# Patient Record
Sex: Female | Born: 1950
Health system: Southern US, Community
[De-identification: ages and names within clinical notes are randomized; demographics above are authoritative.]

## PROBLEM LIST (undated history)

## (undated) DIAGNOSIS — G4733 Obstructive sleep apnea (adult) (pediatric): Secondary | ICD-10-CM

## (undated) DIAGNOSIS — N611 Abscess of the breast and nipple: Secondary | ICD-10-CM

## (undated) DIAGNOSIS — F329 Major depressive disorder, single episode, unspecified: Secondary | ICD-10-CM

## (undated) DIAGNOSIS — M719 Bursopathy, unspecified: Secondary | ICD-10-CM

## (undated) DIAGNOSIS — E559 Vitamin D deficiency, unspecified: Secondary | ICD-10-CM

## (undated) DIAGNOSIS — F439 Reaction to severe stress, unspecified: Secondary | ICD-10-CM

## (undated) DIAGNOSIS — E785 Hyperlipidemia, unspecified: Secondary | ICD-10-CM

## (undated) DIAGNOSIS — J302 Other seasonal allergic rhinitis: Secondary | ICD-10-CM

## (undated) DIAGNOSIS — R0600 Dyspnea, unspecified: Secondary | ICD-10-CM

## (undated) DIAGNOSIS — R600 Localized edema: Secondary | ICD-10-CM

## (undated) DIAGNOSIS — G43909 Migraine, unspecified, not intractable, without status migrainosus: Secondary | ICD-10-CM

## (undated) DIAGNOSIS — M549 Dorsalgia, unspecified: Secondary | ICD-10-CM

## (undated) DIAGNOSIS — C50212 Malignant neoplasm of upper-inner quadrant of left female breast: Secondary | ICD-10-CM

## (undated) DIAGNOSIS — R232 Flushing: Secondary | ICD-10-CM

## (undated) DIAGNOSIS — S21009A Unspecified open wound of unspecified breast, initial encounter: Secondary | ICD-10-CM

## (undated) DIAGNOSIS — C50919 Malignant neoplasm of unspecified site of unspecified female breast: Secondary | ICD-10-CM

## (undated) DIAGNOSIS — R21 Rash and other nonspecific skin eruption: Secondary | ICD-10-CM

## (undated) DIAGNOSIS — Z923 Personal history of irradiation: Secondary | ICD-10-CM

## (undated) DIAGNOSIS — I10 Essential (primary) hypertension: Secondary | ICD-10-CM

## (undated) DIAGNOSIS — G2581 Restless legs syndrome: Secondary | ICD-10-CM

## (undated) DIAGNOSIS — N6019 Diffuse cystic mastopathy of unspecified breast: Secondary | ICD-10-CM

## (undated) DIAGNOSIS — F32A Depression, unspecified: Secondary | ICD-10-CM

## (undated) HISTORY — PX: TONSILLECTOMY: SUR1361

## (undated) HISTORY — PX: COLONOSCOPY: SHX174

## (undated) HISTORY — DX: Vitamin D deficiency, unspecified: E55.9

## (undated) HISTORY — PX: BREAST LUMPECTOMY: SHX2

## (undated) HISTORY — DX: Dorsalgia, unspecified: M54.9

## (undated) HISTORY — DX: Restless legs syndrome: G25.81

## (undated) HISTORY — DX: Migraine, unspecified, not intractable, without status migrainosus: G43.909

## (undated) HISTORY — DX: Dyspnea, unspecified: R06.00

## (undated) HISTORY — DX: Localized edema: R60.0

## (undated) HISTORY — DX: Reaction to severe stress, unspecified: F43.9

## (undated) HISTORY — DX: Bursopathy, unspecified: M71.9

## (undated) HISTORY — DX: Other seasonal allergic rhinitis: J30.2

---
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
## 2003-02-24 HISTORY — PX: LUMBAR MICRODISCECTOMY: SHX99

## 2006-09-08 ENCOUNTER — Encounter: Admission: RE | Admit: 2006-09-08 | Discharge: 2006-09-08 | Payer: Self-pay | Admitting: Internal Medicine

## 2006-09-13 ENCOUNTER — Encounter: Admission: RE | Admit: 2006-09-13 | Discharge: 2006-09-13 | Payer: Self-pay | Admitting: Family Medicine

## 2007-10-10 ENCOUNTER — Other Ambulatory Visit: Admission: RE | Admit: 2007-10-10 | Discharge: 2007-10-10 | Payer: Self-pay | Admitting: Internal Medicine

## 2009-09-03 ENCOUNTER — Encounter: Admission: RE | Admit: 2009-09-03 | Discharge: 2009-09-03 | Payer: Self-pay | Admitting: Internal Medicine

## 2009-09-11 ENCOUNTER — Encounter: Admission: RE | Admit: 2009-09-11 | Discharge: 2009-09-11 | Payer: Self-pay | Admitting: Internal Medicine

## 2011-06-18 ENCOUNTER — Other Ambulatory Visit: Payer: Self-pay | Admitting: Internal Medicine

## 2011-06-18 ENCOUNTER — Other Ambulatory Visit (HOSPITAL_COMMUNITY)
Admission: RE | Admit: 2011-06-18 | Discharge: 2011-06-18 | Disposition: A | Discharge status: Home / Self Care | Payer: Federal, State, Local not specified - PPO | Source: Ambulatory Visit | Attending: Internal Medicine | Admitting: Internal Medicine

## 2011-06-18 ENCOUNTER — Other Ambulatory Visit: Payer: Self-pay | Admitting: *Deleted

## 2011-06-18 DIAGNOSIS — N6322 Unspecified lump in the left breast, upper inner quadrant: Secondary | ICD-10-CM

## 2011-06-18 DIAGNOSIS — Z1159 Encounter for screening for other viral diseases: Secondary | ICD-10-CM | POA: Insufficient documentation

## 2011-06-18 DIAGNOSIS — Z01419 Encounter for gynecological examination (general) (routine) without abnormal findings: Secondary | ICD-10-CM | POA: Insufficient documentation

## 2011-06-23 ENCOUNTER — Other Ambulatory Visit: Payer: Self-pay | Admitting: Internal Medicine

## 2011-06-23 ENCOUNTER — Ambulatory Visit
Admission: RE | Admit: 2011-06-23 | Discharge: 2011-06-23 | Disposition: A | Discharge status: Home / Self Care | Payer: Federal, State, Local not specified - PPO | Source: Ambulatory Visit | Attending: Internal Medicine | Admitting: Internal Medicine

## 2011-06-23 DIAGNOSIS — N6322 Unspecified lump in the left breast, upper inner quadrant: Secondary | ICD-10-CM

## 2011-06-25 ENCOUNTER — Other Ambulatory Visit: Payer: Self-pay | Admitting: Internal Medicine

## 2011-06-25 ENCOUNTER — Ambulatory Visit
Admission: RE | Admit: 2011-06-25 | Discharge: 2011-06-25 | Disposition: A | Discharge status: Home / Self Care | Payer: Federal, State, Local not specified - PPO | Source: Ambulatory Visit | Attending: Internal Medicine | Admitting: Internal Medicine

## 2011-06-25 DIAGNOSIS — N6322 Unspecified lump in the left breast, upper inner quadrant: Secondary | ICD-10-CM

## 2011-06-25 DIAGNOSIS — N63 Unspecified lump in unspecified breast: Secondary | ICD-10-CM

## 2011-06-26 ENCOUNTER — Other Ambulatory Visit: Payer: Self-pay | Admitting: Internal Medicine

## 2011-06-26 ENCOUNTER — Ambulatory Visit
Admission: RE | Admit: 2011-06-26 | Discharge: 2011-06-26 | Disposition: A | Discharge status: Home / Self Care | Payer: Federal, State, Local not specified - PPO | Source: Ambulatory Visit | Attending: Internal Medicine | Admitting: Internal Medicine

## 2011-06-26 DIAGNOSIS — C50912 Malignant neoplasm of unspecified site of left female breast: Secondary | ICD-10-CM

## 2011-06-26 DIAGNOSIS — N63 Unspecified lump in unspecified breast: Secondary | ICD-10-CM

## 2011-06-29 ENCOUNTER — Other Ambulatory Visit: Payer: Self-pay | Admitting: *Deleted

## 2011-06-29 DIAGNOSIS — C50219 Malignant neoplasm of upper-inner quadrant of unspecified female breast: Secondary | ICD-10-CM

## 2011-06-29 DIAGNOSIS — C50212 Malignant neoplasm of upper-inner quadrant of left female breast: Secondary | ICD-10-CM | POA: Insufficient documentation

## 2011-06-29 HISTORY — DX: Malignant neoplasm of upper-inner quadrant of left female breast: C50.212

## 2011-06-30 ENCOUNTER — Ambulatory Visit
Admission: RE | Admit: 2011-06-30 | Discharge: 2011-06-30 | Disposition: A | Discharge status: Home / Self Care | Payer: Federal, State, Local not specified - PPO | Source: Ambulatory Visit | Attending: Internal Medicine | Admitting: Internal Medicine

## 2011-06-30 DIAGNOSIS — C50912 Malignant neoplasm of unspecified site of left female breast: Secondary | ICD-10-CM

## 2011-06-30 MED ORDER — GADOBENATE DIMEGLUMINE 529 MG/ML IV SOLN
19.0000 mL | Freq: Once | INTRAVENOUS | Status: AC | PRN
  Administered 2011-06-30: 19 mL via INTRAVENOUS

## 2011-07-01 ENCOUNTER — Encounter: Payer: Self-pay | Admitting: Radiation Oncology

## 2011-07-01 ENCOUNTER — Other Ambulatory Visit (HOSPITAL_BASED_OUTPATIENT_CLINIC_OR_DEPARTMENT_OTHER): Payer: Federal, State, Local not specified - PPO | Admitting: Lab

## 2011-07-01 ENCOUNTER — Telehealth: Payer: Self-pay | Admitting: *Deleted

## 2011-07-01 ENCOUNTER — Encounter: Payer: Self-pay | Admitting: *Deleted

## 2011-07-01 ENCOUNTER — Encounter (INDEPENDENT_AMBULATORY_CARE_PROVIDER_SITE_OTHER): Payer: Self-pay | Admitting: Surgery

## 2011-07-01 ENCOUNTER — Ambulatory Visit (HOSPITAL_BASED_OUTPATIENT_CLINIC_OR_DEPARTMENT_OTHER): Payer: Federal, State, Local not specified - PPO | Admitting: Oncology

## 2011-07-01 ENCOUNTER — Encounter: Payer: Self-pay | Admitting: Specialist

## 2011-07-01 ENCOUNTER — Ambulatory Visit: Payer: Federal, State, Local not specified - PPO

## 2011-07-01 ENCOUNTER — Encounter: Payer: Self-pay | Admitting: Oncology

## 2011-07-01 ENCOUNTER — Ambulatory Visit: Payer: Federal, State, Local not specified - PPO | Attending: Surgery | Admitting: Physical Therapy

## 2011-07-01 ENCOUNTER — Ambulatory Visit
Admission: RE | Admit: 2011-07-01 | Discharge: 2011-07-01 | Disposition: A | Discharge status: Home / Self Care | Payer: Federal, State, Local not specified - PPO | Source: Ambulatory Visit | Attending: Radiation Oncology | Admitting: Radiation Oncology

## 2011-07-01 ENCOUNTER — Ambulatory Visit (HOSPITAL_BASED_OUTPATIENT_CLINIC_OR_DEPARTMENT_OTHER): Payer: Federal, State, Local not specified - PPO | Admitting: Surgery

## 2011-07-01 VITALS — BP 119/76 | HR 82 | Temp 98.0°F | Ht 65.2 in | Wt 220.6 lb

## 2011-07-01 DIAGNOSIS — M25619 Stiffness of unspecified shoulder, not elsewhere classified: Secondary | ICD-10-CM | POA: Insufficient documentation

## 2011-07-01 DIAGNOSIS — C50219 Malignant neoplasm of upper-inner quadrant of unspecified female breast: Secondary | ICD-10-CM

## 2011-07-01 DIAGNOSIS — Z170 Estrogen receptor positive status [ER+]: Secondary | ICD-10-CM

## 2011-07-01 DIAGNOSIS — C50919 Malignant neoplasm of unspecified site of unspecified female breast: Secondary | ICD-10-CM | POA: Insufficient documentation

## 2011-07-01 DIAGNOSIS — IMO0001 Reserved for inherently not codable concepts without codable children: Secondary | ICD-10-CM | POA: Insufficient documentation

## 2011-07-01 DIAGNOSIS — R293 Abnormal posture: Secondary | ICD-10-CM | POA: Insufficient documentation

## 2011-07-01 LAB — COMPREHENSIVE METABOLIC PANEL
ALT: 23 U/L (ref 0–35)
AST: 23 U/L (ref 0–37)
Albumin: 4.1 g/dL (ref 3.5–5.2)
Alkaline Phosphatase: 90 U/L (ref 39–117)
BUN: 16 mg/dL (ref 6–23)
CO2: 24 mEq/L (ref 19–32)
Calcium: 9.4 mg/dL (ref 8.4–10.5)
Chloride: 104 mEq/L (ref 96–112)
Creatinine, Ser: 1.04 mg/dL (ref 0.50–1.10)
Glucose, Bld: 146 mg/dL — ABNORMAL HIGH (ref 70–99)
Potassium: 3.5 mEq/L (ref 3.5–5.3)
Sodium: 139 mEq/L (ref 135–145)
Total Bilirubin: 0.4 mg/dL (ref 0.3–1.2)
Total Protein: 7.6 g/dL (ref 6.0–8.3)

## 2011-07-01 LAB — CBC WITH DIFFERENTIAL/PLATELET
BASO%: 0.4 % (ref 0.0–2.0)
Basophils Absolute: 0 10*3/uL (ref 0.0–0.1)
EOS%: 2.4 % (ref 0.0–7.0)
Eosinophils Absolute: 0.2 10*3/uL (ref 0.0–0.5)
HCT: 38.9 % (ref 34.8–46.6)
HGB: 12.7 g/dL (ref 11.6–15.9)
LYMPH%: 23.1 % (ref 14.0–49.7)
MCH: 30.2 pg (ref 25.1–34.0)
MCHC: 32.6 g/dL (ref 31.5–36.0)
MCV: 92.6 fL (ref 79.5–101.0)
MONO#: 0.4 10*3/uL (ref 0.1–0.9)
MONO%: 6.2 % (ref 0.0–14.0)
NEUT#: 4.8 10*3/uL (ref 1.5–6.5)
NEUT%: 67.9 % (ref 38.4–76.8)
Platelets: 233 10*3/uL (ref 145–400)
RBC: 4.2 10*6/uL (ref 3.70–5.45)
RDW: 13.9 % (ref 11.2–14.5)
WBC: 7.1 10*3/uL (ref 3.9–10.3)
lymph#: 1.6 10*3/uL (ref 0.9–3.3)

## 2011-07-01 LAB — CANCER ANTIGEN 27.29: CA 27.29: 25 U/mL (ref 0–39)

## 2011-07-01 NOTE — Progress Notes (Signed)
Radiation Oncology         [phone]) [phone] ________________________________  Initial outpatient Consultation  Name: Katelyn Walsh MRN: [id number]  Date: 07/01/2011  DOB: 11-24-1950   REFERRING PHYSICIAN: Cornett, Clovis Pu., MD  DIAGNOSIS: left breast invasive ductal carcinoma, upper inner quadrant, Clinical T1c N 0 M0, ER positive/PR negative HER-2 negative Ki-67 6% grade 2   HISTORY OF PRESENT ILLNESS::Katelyn Walsh is a 61 y.o. female who was seen for a outpatient checkup her physician's assistant felt a lump in her left breast on exam. The patient reports that her last mammogram was in 2011 and was okay. She was referred for workup of this mass and underwent a mammogram that revealed a 1.6 x 1.3 cm lesion in the upper inner quadrant. She underwent a biopsy of the mass which revealed invasive ductal carcinoma, grade 2, ER positive PR negative HER-2 negative Ki-67 6%. An MRI has also been performed as of May 7 which confirmed a 1.6 cm mass in the left breast. There are no other concerning findings MRI.   Patient is in her usual state of health other than feeling more depressed than usual since her husband's death in 01/06/10. She reports decreased energy and more sleepy than usual. She also has chronic headaches but these are not worse than usual.   PREVIOUS RADIATION THERAPY: No  PAST MEDICAL HISTORY:  has a past medical history of Back pain; Migraines; and Seasonal allergies.    PAST SURGICAL HISTORY: Past Surgical History  Procedure Date  . Tonsillectomy    Gynecologic history: She has never conceived a child. Her first menstrual period was at an unknown time. She can't remember the approximate date of her last period. She denies any history of hormone replacement.  FAMILY HISTORY: family history includes Cancer in her father. he had a brain tumor.  SOCIAL HISTORY:  reports that she has never smoked. She does not have any smokeless tobacco history on file. She reports that she  drinks alcohol. She reports that she does not use illicit drugs.  ALLERGIES: Review of patient's allergies indicates no known allergies.  MEDICATIONS:  Current Outpatient Prescriptions  Medication Sig Dispense Refill  . aspirin 81 MG tablet Take 81 mg by mouth daily.      . cetirizine (ZYRTEC) 10 MG tablet Take 10 mg by mouth daily.      . DULoxetine (CYMBALTA) 60 MG capsule Take 60 mg by mouth daily.      [name] Kitchen losartan-hydrochlorothiazide (HYZAAR) 100-25 MG per tablet Take 1 tablet by mouth daily.      [name] Kitchen MISC NATURAL PRODUCTS ER PO Take 1 capsule by mouth daily. Align probiotic      . montelukast (SINGULAIR) 10 MG tablet Take 10 mg by mouth at bedtime.      . multivitamin-iron-minerals-folic acid (CENTRUM) chewable tablet Chew 1 tablet by mouth daily.      . simvastatin (ZOCOR) 40 MG tablet Take 40 mg by mouth at bedtime.      . topiramate (TOPAMAX) 100 MG tablet Take 100 mg by mouth 2 (two) times daily.        REVIEW OF SYSTEMS:  A complete 14 point review of systems was obtained and reviewed with the patient. It is notable for that above.    PHYSICAL EXAM: Blood pressure 119/76. Pulse 82; temperature 98 degrees. weight 220 pounds; height 5 foot 5 inches.  General: Alert and oriented, in no acute distress HEENT: Head is normocephalic. Pupils are equally round and reactive  to light. Extraocular movements are intact. Oropharynx is clear. Neck: Neck is supple, no palpable cervical or supraclavicular lymphadenopathy. Heart: Regular in rate and rhythm with no murmurs, rubs, or gallops. Chest: Clear to auscultation bilaterally, with no rhonchi, wheezes, or rales. Abdomen: Soft, nontender, nondistended, with no rigidity or guarding. Extremities: No cyanosis or edema. Lymphatics: No concerning lymphadenopathy. Skin: No concerning lesions. Musculoskeletal: symmetric strength and muscle tone throughout. Neurologic: Cranial nerves II through XII are grossly intact. No obvious focalities. Speech  is fluent. Coordination is intact. Psychiatric: Judgment and insight are intact. Affect is appropriate. Breasts: The left breast is notable for a post biopsy ecchymosis at the 12:00 position. There are no palpable lesions of concern in either breast nor any palpable axillary lymphadenopathy bilaterally    LABORATORY DATA:  Lab Results  Component Value Date   WBC 7.1 07/01/2011   HGB 12.7 07/01/2011   HCT 38.9 07/01/2011   MCV 92.6 07/01/2011   PLT 233 07/01/2011   CMP     Component Value Date/Time   NA 139 07/01/2011 1203   K 3.5 07/01/2011 1203   CL 104 07/01/2011 1203   CO2 24 07/01/2011 1203   GLUCOSE 146* 07/01/2011 1203   BUN 16 07/01/2011 1203   CREATININE 1.04 07/01/2011 1203   CALCIUM 9.4 07/01/2011 1203   PROT 7.6 07/01/2011 1203   ALBUMIN 4.1 07/01/2011 1203   AST 23 07/01/2011 1203   ALT 23 07/01/2011 1203   ALKPHOS 90 07/01/2011 1203   BILITOT 0.4 07/01/2011 1203      RADIOGRAPHY: US Breast Left  06/23/2011  *RADIOLOGY REPORT*  Clinical Data:  Palpable abnormality in the upper inner left breast at physical examination.  Annual reevaluation, right breast.  DIGITAL DIAGNOSTIC BILATERAL MAMMOGRAM WITH CAD AND LEFT BREAST ULTRASOUND:  Comparison:  09/11/2009, 09/03/2009, dating back to 09/08/2006.  Findings:  CC and MLO views of both breasts and a spot tangential view of the left breast at the site of palpable concern were obtained.  Scattered fibroglandular parenchymal pattern, unchanged. Approximate 2.3 x 1.7 x 1.3 cm spiculated dense mass in the inner left breast, middle third, corresponds to the palpable abnormality as marked on the tangential view.  No suspicious mass, architectural distortion, or suspicious calcifications elsewhere in either breast. Mammographic images were processed with CAD.  On physical exam, I palpate a firm, fixed mass in the approximate 10 o'clock position of the left breast, 7 cm from the nipple.  Ultrasound is performed, showing and irregular hypoechoic mass with acoustic  shadowing at the 10 o'clock position of the left breast, 7 cm from the nipple, measuring approximately 1.3 x 0.8 x 1.6 cm.  IMPRESSION: Mass in the 10 o'clock position of the left breast, 7 cm from the nipple, suspicious for breast cancer.  Recommendation:  Ultrasound guided core needle biopsy was discussed with the patient who agrees to proceed.  This has been scheduled for Thursday May 2 at 10:00 a.m.  BI-RADS CATEGORY 5:  Highly suggestive of malignancy - appropriate action should be taken.  Original Report Authenticated By: Arnell Sieving, M.D.   Mr Breast Bilateral W Wo Contrast  06/30/2011  *RADIOLOGY REPORT*  Clinical Data: Recently diagnosed left breast invasive ductal carcinoma.  BILATERAL BREAST MRI WITH AND WITHOUT CONTRAST  Technique: Multiplanar, multisequence MR images of both breasts were obtained prior to and following the intravenous administration of 19ml of MultiHance.  Three dimensional images were evaluated at the independent DynaCad workstation.  Comparison:  Recent mammogram, ultrasound and  biopsy examinations.  Findings: Mild background parenchymal enhancement in both breasts.  1.6 x 1.6 x 1.1 cm oval, mildly irregularly marginated enhancing mass in the upper inner quadrant of the left breast in the middle third.  This contains a biopsy marker clip artifact superiorly and corresponds to the recently biopsied invasive ductal carcinoma. This has predominately plateau enhancement kinetics with minimal washout.  No additional masses or areas of enhancement suspicious for malignancy in either breast.  No abnormal appearing lymph nodes.  A 4.1 cm liver cyst is incidentally noted.  IMPRESSION: 1.6 x 1.6 x 1.1 cm biopsy-proven invasive ductal carcinoma in the upper inner quadrant of the left breast.  Otherwise, unremarkable examination.  THREE-DIMENSIONAL MR IMAGE RENDERING ON INDEPENDENT WORKSTATION:  Three-dimensional MR images were rendered by post-processing of the original MR data on an  independent workstation.  The three- dimensional MR images were interpreted, and findings were reported in the accompanying complete MRI report for this study.  BI-RADS CATEGORY 6:  Known biopsy-proven malignancy - appropriate action should be taken.  Recommendation:  Treatment plan.  Original Report Authenticated By: Darrol Angel, M.D.   [place] Core Biopsy  06/25/2011  *RADIOLOGY REPORT*  Clinical Data:  The patient presents for biopsy of mass in the 10 o'clock location of the left breast.  ULTRASOUND GUIDED VACUUM ASSISTED CORE BIOPSY OF THE LEFT BREAST  Comparison: 06/23/2011  I met with the patient and her sister-in-law, and we discussed the procedure of ultrasound-guided biopsy, including benefits and alternatives.  We discussed the high likelihood of a successful procedure. We discussed the risks of the procedure, including infection, bleeding, tissue injury, clip migration, and inadequate sampling.  Informed, written consent was given.  Ultrasound is performed, confirming presence of ill-defined, shadowing mass in the 10 o'clock location of the left breast 5 cm from the nipple.  This is associated with a palpable mass.  Using sterile technique, 2% lidocaine, ultrasound guidance, and a 12 gauge vacuum assisted needle, biopsy was performed of mass in the 10 o'clock location of the left breast, using a lateral approach.  At the conclusion of the procedure, a tissue marker clip was deployed into the biopsy cavity.  Follow-up 2-view mammogram was performed and dictated separately.  IMPRESSION: Ultrasound-guided biopsy of left breast mass.  No apparent complications.  Original Report Authenticated By: Patterson Hammersmith, M.D.   Mm Digital Diagnostic Bilat  06/23/2011  *RADIOLOGY REPORT*  Clinical Data:  Palpable abnormality in the upper inner left breast at physical examination.  Annual reevaluation, right breast.  DIGITAL DIAGNOSTIC BILATERAL MAMMOGRAM WITH CAD AND LEFT BREAST ULTRASOUND:  Comparison:   09/11/2009, 09/03/2009, dating back to 09/08/2006.  Findings:  CC and MLO views of both breasts and a spot tangential view of the left breast at the site of palpable concern were obtained.  Scattered fibroglandular parenchymal pattern, unchanged. Approximate 2.3 x 1.7 x 1.3 cm spiculated dense mass in the inner left breast, middle third, corresponds to the palpable abnormality as marked on the tangential view.  No suspicious mass, architectural distortion, or suspicious calcifications elsewhere in either breast. Mammographic images were processed with CAD.  On physical exam, I palpate a firm, fixed mass in the approximate 10 o'clock position of the left breast, 7 cm from the nipple.  Ultrasound is performed, showing and irregular hypoechoic mass with acoustic shadowing at the 10 o'clock position of the left breast, 7 cm from the nipple, measuring approximately 1.3 x 0.8 x 1.6 cm.  IMPRESSION: Mass in the 10  o'clock position of the left breast, 7 cm from the nipple, suspicious for breast cancer.  Recommendation:  Ultrasound guided core needle biopsy was discussed with the patient who agrees to proceed.  This has been scheduled for Thursday May 2 at 10:00 a.m.  BI-RADS CATEGORY 5:  Highly suggestive of malignancy - appropriate action should be taken.  Original Report Authenticated By: Arnell Sieving, M.D.   Mm Digital Diagnostic Unilat L  06/25/2011  *RADIOLOGY REPORT*  Clinical Data:  Status post ultrasound guided core biopsy of left breast mass.  DIGITAL DIAGNOSTIC LEFT MAMMOGRAM  Comparison:  06/23/2011  Findings:  Films are performed following ultrasound guided biopsy of mass in the 10 o'clock location of the left breast.  A ribbon shaped clip is identified within the upper inner quadrant of the left breast, in the expected location after biopsy.  IMPRESSION: Tissue marker clip is in expected location after biopsy.  Original Report Authenticated By: Patterson Hammersmith, M.D.   Mm Radiologist Eval And  Mgmt  06/26/2011  *RADIOLOGY REPORT*  Clinical Data: Status post biopsy  left breast.  CONSULTATION  Comparison: Jun 25, 2011, June 23, 2011  Findings: The patient is status post left breast biopsy and is here for results.  The pathology revealed invasive ductal carcinoma. This is found to be concordant with imaging findings.  I discussed the results with the patient and Insert her questions. I examined the patient's biopsy site which is clean.  The patient states she is having no problems or complications at the biopsy site.  The patient has appointment for MRI of the breast on Jun 30, 2011 and at multidisciplinary clinic of [facility] on Jul 01, 2011.  I gave these appointments to the patient.  IMPRESSION: Post biopsy clinical consultation as described.  Original Report Authenticated By: Sherian Rein, M.D.   PATHOLOGY: AS ABOVE    IMPRESSION/PLAN: This is a very pleasant 61 year old woman with stage I, clinical T1 CN 0 M0 left breast cancer.   She has been discussed at our multidisciplinary tumor board.  The consensus is that she be a good candidate for breast conservation. I talked to her about the option of a mastectomy and informed her that her expected overall survival would be equivalent between mastectomy and breast conservation, based upon randomized controlled data. She is enthusiastic about breast conservation.  She understands that radiotherapy would be an important modality to minimize her risk of an in-breast recurrence after lumpectomy. We discussed the risks, benefits, and side effects of radiotherapy. We discussed that radiation would take approximately 6 weeks to complete and that I would give the patient a few weeks to heal following surgery before starting treatment planning. Based on her body habitus, I am not sure she would be a candidate for the [place] 3.5 week regimen.  We spoke about acute effects including skin irritation and fatigue as well as much less common late  effects including lung and heart irritation. We spoke about the latest technology that is used to minimize the risk of late effects for breast cancer patients undergoing radiotherapy. No guarantees of treatment were given. The patient is enthusiastic about proceeding with treatment. I look forward to participating in the patient's care.  Therefore, following lumpectomy and sentinel lymph node biopsy the patient will undergo Oncotype DX testing. If she is not a candidate for chemotherapy she'll be sent to me for radiation planning and treatment. Then, she will undergo antiestrogen therapy. I have given the patient my contact  information in case she has any further questions following today's consultation.   -----------------------------------  Lonie Peak, MD

## 2011-07-01 NOTE — Progress Notes (Signed)
I met the patient and her family at the multidisciplinary breast clinic and reviewed with her the distress screening.  She rated her distress as an "8" because of caregiving responsibilities with her 62 yr old mother and the death of her husband in Jul 23, 2009.  She described being tired and feeling depressed.  I encouraged her to take advantage of the support services available in the [facility] and [facility], particularly counseling and breast cancer support group.  I also told her I would ask one of our social workers to speak with her regarding community resources for individuals with dementia, so that perhaps she can have some relief from her caregiving responsibilities.

## 2011-07-01 NOTE — Telephone Encounter (Signed)
gave patient appointment for 08-2011 printed out calendar and  gave to the patient 

## 2011-07-01 NOTE — Progress Notes (Signed)
Katelyn Walsh [id number] December 01, 1950 61 y.o. 07/01/2011 2:44 PM  CC Dr. Maisie Fus Cornett Dr. Lonie Peak Dr. Kirby Funk   REASON FOR CONSULTATION:  61 year old female with new diagnosis of left breast cancer.  STAGE:  Cancer of upper-inner quadrant of female breast   Primary site: Breast (Left)   Staging method: AJCC 7th Edition   Clinical: Stage IA (T1c, N0, cM0)   Summary: Stage IA (T1c, N0, cM0)  REFERRING PHYSICIAN: Dr. Maisie Fus Cornett  HISTORY OF PRESENT ILLNESS:  Katelyn Walsh is a 60 y.o. female multiple medical problems who was who was seen for her annual exam and was found to have a palpable left breast mass. She went on to have a mammogram that revealed a 1.6 x 1.3 cm area of concern in the upper inner quadrant. Ultrasound guided biopsy showed a invasive ductal carcinoma, grade 2, ER positive, PR negative and Her2Neu negative with Ki-67 of 6%.  MRI confirmed the mass to measure to measure at 1.6 cm, without any other findings. She is without any complaints. Her case was discussed at the multidisciplinary breast conference where her path and radiology was reviewed.   Past Medical History: Past Medical History  Diagnosis Date  . Back pain   . Migraines   . Seasonal allergies     Past Surgical History: Past Surgical History  Procedure Date  . Tonsillectomy     Family History: Family History  Problem Relation Age of Onset  . Cancer Father     brain    Social History History  Substance Use Topics  . Smoking status: Never Smoker   . Smokeless tobacco: Not on file  . Alcohol Use: Yes    Allergies: No Known Allergies  Current Medications: Current Outpatient Prescriptions  Medication Sig Dispense Refill  . aspirin 81 MG tablet Take 81 mg by mouth daily.      . cetirizine (ZYRTEC) 10 MG tablet Take 10 mg by mouth daily.      . DULoxetine (CYMBALTA) 60 MG capsule Take 60 mg by mouth daily.      [name] Kitchen losartan-hydrochlorothiazide (HYZAAR) 100-25 MG per tablet  Take 1 tablet by mouth daily.      [name] Kitchen MISC NATURAL PRODUCTS ER PO Take 1 capsule by mouth daily. Align probiotic      . montelukast (SINGULAIR) 10 MG tablet Take 10 mg by mouth at bedtime.      . multivitamin-iron-minerals-folic acid (CENTRUM) chewable tablet Chew 1 tablet by mouth daily.      . simvastatin (ZOCOR) 40 MG tablet Take 40 mg by mouth at bedtime.      . topiramate (TOPAMAX) 100 MG tablet Take 100 mg by mouth 2 (two) times daily.        OB/GYN History: Menarche at 61, post menopause at 16, she took prem-pro for a few months. , nulliparous,    Fertility Discussion: N/A Prior History of Cancer: N/A  Health Maintenance:  Colonoscopy 2008 Bone Density 3 years ago Last PAP smear 4/13  ECOG PERFORMANCE STATUS: 0 - Asymptomatic  Genetic Counseling/testing: not indicated at this time based on patients family history and age of onset of breast cancer.  REVIEW OF SYSTEMS:  Constitutional: positive for fatigue Ears, nose, mouth, throat, and face: positive for nasal congestion Respiratory: negative Cardiovascular: negative Gastrointestinal: negative Genitourinary:negative Integument/breast: positive for breast lump and breast tenderness Hematologic/lymphatic: negative Musculoskeletal:positive for arthralgias Neurological: negative  PHYSICAL EXAMINATION: Blood pressure 119/76, pulse 82, temperature 98 F (36.7 C), height 5'  5.2" (1.656 m), weight 220 lb 9.6 oz (100.064 kg).  ZOX:WRUEA, healthy and no distress SKIN: skin color, texture, turgor are normal HEAD: Normocephalic EYES: PERRLA, EOMI, Conjunctiva are pink and non-injected, sclera clear EARS: External ears normal OROPHARYNX:no exudate and no erythema  NECK: supple, no adenopathy LYMPH:  no palpable lymphadenopathy, no hepatosplenomegaly BREAST:abnormal mass palpable in the left breast with area of ecchymosis LUNGS: clear to auscultation and percussion HEART: regular rate & rhythm, no murmurs and no  gallops ABDOMEN:abdomen soft, non-tender, normal bowel sounds and no masses or organomegaly BACK: Back symmetric, no curvature., No CVA tenderness EXTREMITIES:no edema, no clubbing, no cyanosis  NEURO: alert & oriented x 3 with fluent speech, no focal motor/sensory deficits, gait normal  STUDIES/RESULTS: US Breast Left  07/19/11  *RADIOLOGY REPORT*  Clinical Data:  Palpable abnormality in the upper inner left breast at physical examination.  Annual reevaluation, right breast.  DIGITAL DIAGNOSTIC BILATERAL MAMMOGRAM WITH CAD AND LEFT BREAST ULTRASOUND:  Comparison:  09/11/2009, 09/03/2009, dating back to 09/08/2006.  Findings:  CC and MLO views of both breasts and a spot tangential view of the left breast at the site of palpable concern were obtained.  Scattered fibroglandular parenchymal pattern, unchanged. Approximate 2.3 x 1.7 x 1.3 cm spiculated dense mass in the inner left breast, middle third, corresponds to the palpable abnormality as marked on the tangential view.  No suspicious mass, architectural distortion, or suspicious calcifications elsewhere in either breast. Mammographic images were processed with CAD.  On physical exam, I palpate a firm, fixed mass in the approximate 10 o'clock position of the left breast, 7 cm from the nipple.  Ultrasound is performed, showing and irregular hypoechoic mass with acoustic shadowing at the 10 o'clock position of the left breast, 7 cm from the nipple, measuring approximately 1.3 x 0.8 x 1.6 cm.  IMPRESSION: Mass in the 10 o'clock position of the left breast, 7 cm from the nipple, suspicious for breast cancer.  Recommendation:  Ultrasound guided core needle biopsy was discussed with the patient who agrees to proceed.  This has been scheduled for Thursday May 2 at 10:00 a.m.  BI-RADS CATEGORY 5:  Highly suggestive of malignancy - appropriate action should be taken.  Original Report Authenticated By: Arnell Sieving, M.D.   Mr Breast Bilateral W Wo  Contrast  06/30/2011  *RADIOLOGY REPORT*  Clinical Data: Recently diagnosed left breast invasive ductal carcinoma.  BILATERAL BREAST MRI WITH AND WITHOUT CONTRAST  Technique: Multiplanar, multisequence MR images of both breasts were obtained prior to and following the intravenous administration of 19ml of MultiHance.  Three dimensional images were evaluated at the independent DynaCad workstation.  Comparison:  Recent mammogram, ultrasound and biopsy examinations.  Findings: Mild background parenchymal enhancement in both breasts.  1.6 x 1.6 x 1.1 cm oval, mildly irregularly marginated enhancing mass in the upper inner quadrant of the left breast in the middle third.  This contains a biopsy marker clip artifact superiorly and corresponds to the recently biopsied invasive ductal carcinoma. This has predominately plateau enhancement kinetics with minimal washout.  No additional masses or areas of enhancement suspicious for malignancy in either breast.  No abnormal appearing lymph nodes.  A 4.1 cm liver cyst is incidentally noted.  IMPRESSION: 1.6 x 1.6 x 1.1 cm biopsy-proven invasive ductal carcinoma in the upper inner quadrant of the left breast.  Otherwise, unremarkable examination.  THREE-DIMENSIONAL MR IMAGE RENDERING ON INDEPENDENT WORKSTATION:  Three-dimensional MR images were rendered by post-processing of the original MR data on  an independent workstation.  The three- dimensional MR images were interpreted, and findings were reported in the accompanying complete MRI report for this study.  BI-RADS CATEGORY 6:  Known biopsy-proven malignancy - appropriate action should be taken.  Recommendation:  Treatment plan.  Original Report Authenticated By: Darrol Angel, M.D.   [place] Core Biopsy  06/25/2011  *RADIOLOGY REPORT*  Clinical Data:  The patient presents for biopsy of mass in the 10 o'clock location of the left breast.  ULTRASOUND GUIDED VACUUM ASSISTED CORE BIOPSY OF THE LEFT BREAST  Comparison: 06/23/2011  I  met with the patient and her sister-in-law, and we discussed the procedure of ultrasound-guided biopsy, including benefits and alternatives.  We discussed the high likelihood of a successful procedure. We discussed the risks of the procedure, including infection, bleeding, tissue injury, clip migration, and inadequate sampling.  Informed, written consent was given.  Ultrasound is performed, confirming presence of ill-defined, shadowing mass in the 10 o'clock location of the left breast 5 cm from the nipple.  This is associated with a palpable mass.  Using sterile technique, 2% lidocaine, ultrasound guidance, and a 12 gauge vacuum assisted needle, biopsy was performed of mass in the 10 o'clock location of the left breast, using a lateral approach.  At the conclusion of the procedure, a tissue marker clip was deployed into the biopsy cavity.  Follow-up 2-view mammogram was performed and dictated separately.  IMPRESSION: Ultrasound-guided biopsy of left breast mass.  No apparent complications.  Original Report Authenticated By: Patterson Hammersmith, M.D.   Mm Digital Diagnostic Bilat  06/23/2011  *RADIOLOGY REPORT*  Clinical Data:  Palpable abnormality in the upper inner left breast at physical examination.  Annual reevaluation, right breast.  DIGITAL DIAGNOSTIC BILATERAL MAMMOGRAM WITH CAD AND LEFT BREAST ULTRASOUND:  Comparison:  09/11/2009, 09/03/2009, dating back to 09/08/2006.  Findings:  CC and MLO views of both breasts and a spot tangential view of the left breast at the site of palpable concern were obtained.  Scattered fibroglandular parenchymal pattern, unchanged. Approximate 2.3 x 1.7 x 1.3 cm spiculated dense mass in the inner left breast, middle third, corresponds to the palpable abnormality as marked on the tangential view.  No suspicious mass, architectural distortion, or suspicious calcifications elsewhere in either breast. Mammographic images were processed with CAD.  On physical exam, I palpate a  firm, fixed mass in the approximate 10 o'clock position of the left breast, 7 cm from the nipple.  Ultrasound is performed, showing and irregular hypoechoic mass with acoustic shadowing at the 10 o'clock position of the left breast, 7 cm from the nipple, measuring approximately 1.3 x 0.8 x 1.6 cm.  IMPRESSION: Mass in the 10 o'clock position of the left breast, 7 cm from the nipple, suspicious for breast cancer.  Recommendation:  Ultrasound guided core needle biopsy was discussed with the patient who agrees to proceed.  This has been scheduled for Thursday May 2 at 10:00 a.m.  BI-RADS CATEGORY 5:  Highly suggestive of malignancy - appropriate action should be taken.  Original Report Authenticated By: Arnell Sieving, M.D.   Mm Digital Diagnostic Unilat L  06/25/2011  *RADIOLOGY REPORT*  Clinical Data:  Status post ultrasound guided core biopsy of left breast mass.  DIGITAL DIAGNOSTIC LEFT MAMMOGRAM  Comparison:  06/23/2011  Findings:  Films are performed following ultrasound guided biopsy of mass in the 10 o'clock location of the left breast.  A ribbon shaped clip is identified within the upper inner quadrant of the left breast,  in the expected location after biopsy.  IMPRESSION: Tissue marker clip is in expected location after biopsy.  Original Report Authenticated By: Patterson Hammersmith, M.D.   Mm Radiologist Eval And Mgmt  06/26/2011  *RADIOLOGY REPORT*  Clinical Data: Status post biopsy  left breast.  CONSULTATION  Comparison: Jun 25, 2011, June 23, 2011  Findings: The patient is status post left breast biopsy and is here for results.  The pathology revealed invasive ductal carcinoma. This is found to be concordant with imaging findings.  I discussed the results with the patient and Insert her questions. I examined the patient's biopsy site which is clean.  The patient states she is having no problems or complications at the biopsy site.  The patient has appointment for MRI of the breast on Jun 30, 2011  and at multidisciplinary clinic of [facility] on Jul 01, 2011.  I gave these appointments to the patient.  IMPRESSION: Post biopsy clinical consultation as described.  Original Report Authenticated By: Sherian Rein, M.D.     LABS:    Chemistry      Component Value Date/Time   NA 139 07/01/2011 1203   K 3.5 07/01/2011 1203   CL 104 07/01/2011 1203   CO2 24 07/01/2011 1203   BUN 16 07/01/2011 1203   CREATININE 1.04 07/01/2011 1203      Component Value Date/Time   CALCIUM 9.4 07/01/2011 1203   ALKPHOS 90 07/01/2011 1203   AST 23 07/01/2011 1203   ALT 23 07/01/2011 1203   BILITOT 0.4 07/01/2011 1203      Lab Results  Component Value Date   WBC 7.1 07/01/2011   HGB 12.7 07/01/2011   HCT 38.9 07/01/2011   MCV 92.6 07/01/2011   PLT 233 07/01/2011   PATHOLOGY: as above  ASSESSMENT    61 year old female with new diagnosis of left breast cancer measuring 1.6 cm needle core biopsy shows invasive ductal carcinoma that is ER positive PR negative and Her2Neu negative, with Ki-67 of 6 %. Patient is seen in the multidisciplinary clinic for discussion of her treatment options. Per NCCN guidelines patient is a good candidate for breast conservation followed adjuvant systemic treatment. She most likle would receive anti-estrogen but I do think that doing Oncotype Dx.   PLAN:    1. Patient will have lumpectomy and SNL  2. Post lumpectomy will order Oncotype Dx for cancer recurrence score. The rational for this was discussed with the patient  3. Patient will be seen back after her surgery..    Thank you so much for allowing me to participate in the care of Katelyn Walsh. I will continue to follow up the patient with you and assist in her care.  All questions were answered. The patient knows to call the clinic with any problems, questions or concerns. We can certainly see the patient much sooner if necessary.  I spent 60 minutes counseling the patient face to face. The total time spent in the appointment  was 60 minutes.  Drue Second, MD Medical/Oncology [facility] [phone] (beeper) [phone] (Office)  07/01/2011, 2:44 PM 07/01/2011, 2:44 PM

## 2011-07-01 NOTE — Patient Instructions (Signed)
Sentinel Lymph Node Biopsy Care After Refer to this sheet over the next few weeks. These instructions give you general information on caring for yourself after you leave the hospital. Your doctor may also give you specific instructions. Your treatment has been planned according to the most current medical practices available. However, problems sometimes occur. If you have any problems or questions after discharge, call your caregiver. ACTIVITY  Limit your activity as directed.   Stop vigorous exercise as directed.   Refrain from repetitive motion on the side of surgery as directed.   You may shower 24 hours after your surgery.   It is okay to get the cut from surgery (incision) wet.   Gently pat the incision dry.   You should wear the surgical bra supplied to you (or one of your own) for the next 48 hours. You may remove the bra to shower.  INCISION  The paper tapes or skin adhesive strips should remain in place until your follow-up visit.   It is okay if the tapes fall off. There is no need to replace them if this happens.  DISCOMFORT  It is common to have discomfort (aching or pain) for the first few days after surgery. This is normal.   Mild anti-inflammatory medicines taken regularly for the next 3 days after surgery are usually able to control any discomfort you may have. Take only as directed.   Report any pain not controlled by medicine to your surgeon or caregiver.  NUTRITION  You may resume your regular diet.   You may wish to eat a light meal for your first meal after surgery.  AFTER SURGERY  The color of your urine may be blue for the next 24 hours. This is due to the blue dye used during surgery. This is normal.   The dye may also cause a blue tint to your breast. This will go away.  SEEK IMMEDIATE MEDICAL CARE IF:   Your pain is not controlled by medicine.   You notice redness, swelling, or increased drainage at the surgery site.   You feel sick to your  stomach (nausea) or vomit.  Document Released: 09/24/2003 Document Revised: 01/29/2011 Document Reviewed: 01/05/2011 [facility] Patient Information 2012 [place], [place].Lumpectomy, Breast Conserving Surgery A lumpectomy is breast surgery that removes only part of the breast. Another name used may be partial mastectomy. The amount removed varies. Make sure you understand how much of your breast will be removed. Reasons for a lumpectomy:  Any solid breast mass.   Grouped significant nodularity that may be confused with a solitary breast mass.  Lumpectomy is the most common form of breast cancer surgery today. The surgeon removes the portion of your breast which contains the tumor (cancer). This is the lump. Some normal tissue around the lump is also removed to be sure that all the tumor has been removed.  If cancer cells are found in the margins where the breast tissue was removed, your surgeon will do more surgery to remove the remaining cancer tissue. This is called re-excision surgery. Radiation and/or chemotherapy treatments are often given following a lumpectomy to kill any cancer cells that could possibly remain.  REASONS YOU MAY NOT BE ABLE TO HAVE BREAST CONSERVING SURGERY:  The tumor is located in more than one place.   Your breast is small and the tumor is large so the breast would be disfigured.   The entire tumor removal is not successful with a lumpectomy.   You cannot commit to a  full course of chemotherapy, radiation therapy or are pregnant and cannot have radiation.   You have previously had radiation to the breast to treat cancer.  HOW A LUMPECTOMY IS PERFORMED If overnight nursing is not required following a biopsy, a lumpectomy can be performed as a same-day surgery. This can be done in a hospital, clinic, or surgical center. The anesthesia used will depend on your surgeon. They will discuss this with you. A general anesthetic keeps you sleeping through the procedure. LET YOUR  CAREGIVERS KNOW ABOUT THE FOLLOWING:  Allergies   Medications taken including herbs, eye drops, over the counter medications, and creams.   Use of steroids (by mouth or creams)   Previous problems with anesthetics or Novocaine.   Possibility of pregnancy, if this applies   History of blood clots (thrombophlebitis)   History of bleeding or blood problems.   Previous surgery   Other health problems  BEFORE THE PROCEDURE You should be present one hour prior to your procedure unless directed otherwise.  AFTER THE PROCEDURE  After surgery, you will be taken to the recovery area where a nurse will watch and check your progress. Once you're awake, stable, and taking fluids well, barring other problems you will be allowed to go home.   Ice packs applied to your operative site may help with discomfort and keep the swelling down.   A small rubber drain may be placed in the breast for a couple of days to prevent a hematoma from developing in the breast.   A pressure dressing may be applied for 24 to 48 hours to prevent bleeding.   Keep the wound dry.   You may resume a normal diet and activities as directed. Avoid strenuous activities affecting the arm on the side of the biopsy site such as tennis, swimming, heavy lifting (more than 10 pounds) or pulling.   Bruising in the breast is normal following this procedure.   Wearing a bra - even to bed - may be more comfortable and also help keep the dressing on.   Change dressings as directed.   Only take over-the-counter or prescription medicines for pain, discomfort, or fever as directed by your caregiver.  Call for your results as instructed by your surgeon. Remember it is your responsibility to get the results of your lumpectomy if your surgeon asked you to follow-up. Do not assume everything is fine if you have not heard from your caregiver. SEEK MEDICAL CARE IF:   There is increased bleeding (more than a small spot) from the wound.     You notice redness, swelling, or increasing pain in the wound.   Pus is coming from wound.   An unexplained oral temperature above 102 F (38.9 C) develops.   You notice a foul smell coming from the wound or dressing.  SEEK IMMEDIATE MEDICAL CARE IF:   You develop a rash.   You have difficulty breathing.   You have any allergic problems.  Document Released: 03/23/2006 Document Revised: 01/29/2011 Document Reviewed: 06/24/2006 [facility] Patient Information 2012 [place], [place].

## 2011-07-01 NOTE — Progress Notes (Signed)
Patient ID: Katelyn Walsh, female   DOB: 05/26/1950, 61 y.o.   MRN: [id number]  No chief complaint on file.   HPI Katelyn Walsh is Walsh 61 y.o. female.  Pt presents with abnormal mammogram and left breast mass at the request of Dr Griffin. Pt denies breast pain,  Mass or discharge. HPI  Past Medical History  Diagnosis Date  . Back pain   . Migraines   . Seasonal allergies     Past Surgical History  Procedure Date  . Tonsillectomy     Family History  Problem Relation Age of Onset  . Cancer Father     brain    Social History History  Substance Use Topics  . Smoking status: Never Smoker   . Smokeless tobacco: Not on file  . Alcohol Use: Yes    No Known Allergies  Current Outpatient Prescriptions  Medication Sig Dispense Refill  . aspirin 81 MG tablet Take 81 mg by mouth daily.      . cetirizine (ZYRTEC) 10 MG tablet Take 10 mg by mouth daily.      . DULoxetine (CYMBALTA) 60 MG capsule Take 60 mg by mouth daily.      . losartan-hydrochlorothiazide (HYZAAR) 100-25 MG per tablet Take 1 tablet by mouth daily.      . MISC NATURAL PRODUCTS ER PO Take 1 capsule by mouth daily. Align probiotic      . montelukast (SINGULAIR) 10 MG tablet Take 10 mg by mouth at bedtime.      . multivitamin-iron-minerals-folic acid (CENTRUM) chewable tablet Chew 1 tablet by mouth daily.      . simvastatin (ZOCOR) 40 MG tablet Take 40 mg by mouth at bedtime.      . topiramate (TOPAMAX) 100 MG tablet Take 100 mg by mouth 2 (two) times daily.        Review of Systems Review of Systems  Constitutional: Negative for fever, chills and unexpected weight change.  HENT: Negative for hearing loss, congestion, sore throat, trouble swallowing and voice change.   Eyes: Negative for visual disturbance.  Respiratory: Negative for cough and wheezing.   Cardiovascular: Negative for chest pain, palpitations and leg swelling.  Gastrointestinal: Negative for nausea, vomiting, abdominal pain, diarrhea, constipation,  blood in stool, abdominal distention and anal bleeding.  Genitourinary: Negative for hematuria, vaginal bleeding and difficulty urinating.  Musculoskeletal: Negative for arthralgias.  Skin: Negative for rash and wound.  Neurological: Negative for seizures, syncope and headaches.  Hematological: Negative for adenopathy. Does not bruise/bleed easily.  Psychiatric/Behavioral: Negative for confusion.    There were no vitals taken for this visit.  Physical Exam Physical Exam  Constitutional: She appears well-developed and well-nourished.  HENT:  Head: Normocephalic and atraumatic.  Eyes: EOM are normal. Pupils are equal, round, and reactive to light.  Neck: Normal range of motion. Neck supple.  Cardiovascular: Normal rate and regular rhythm.   Pulmonary/Chest: Effort normal and breath sounds normal.       Left breast mass 1 cmm 12 oclock 3 cm from nipple.  No nipple discharge.  Right breast normal   Abdominal: Soft.  Musculoskeletal: Normal range of motion.  Lymphadenopathy:    She has no cervical adenopathy.    She has no axillary adenopathy.  Skin: Skin is warm and dry.  Psychiatric: She has Walsh normal mood and affect. Her behavior is normal. Judgment and thought content normal.    Data Reviewed Left breast mammogram and MRI 1 cm mass central upper breast invasive mammary   carcinoma ER POS PR POS Her 2 neu negative.  Assessment    Stage 1 left breast cancer    Plan    Pt wishes breast conservation.  Left breast lumpectomy and left SLN mapping  Recommended.  The procedure has been discussed with the patient. Alternatives to surgery have been discussed with the patient.  Risks of surgery include bleeding,  Infection,  Seroma formation, death,  and the need for further surgery.   The patient understands and wishes to proceed.Sentinel lymph node mapping and dissection has been discussed with the patient.  Risk of bleeding,  Infection,  Seroma formation,  Additional procedures,,   Shoulder weakness ,  Shoulder stiffness,  Nerve and blood vessel injury and reaction to the mapping dyes have been discussed.  Alternatives to surgery have been discussed with the patient.  The patient agrees to proceed.       Katelyn Walsh. 07/01/2011, 4:00 PM    

## 2011-07-02 ENCOUNTER — Encounter: Payer: Self-pay | Admitting: *Deleted

## 2011-07-02 ENCOUNTER — Other Ambulatory Visit (INDEPENDENT_AMBULATORY_CARE_PROVIDER_SITE_OTHER): Payer: Self-pay | Admitting: Surgery

## 2011-07-02 DIAGNOSIS — C50919 Malignant neoplasm of unspecified site of unspecified female breast: Secondary | ICD-10-CM

## 2011-07-02 NOTE — Progress Notes (Signed)
Mailed after appt letter to pt. 

## 2011-07-09 ENCOUNTER — Encounter: Payer: Self-pay | Admitting: *Deleted

## 2011-07-09 ENCOUNTER — Telehealth: Payer: Self-pay | Admitting: *Deleted

## 2011-07-09 NOTE — Telephone Encounter (Signed)
Spoke to pt concerning [facility] from 5/8.  Pt denies questions or concerns regarding dx or treatment care plan.  Encourage pt to call with needs.  Confirmed surgery date.  Informed pt we will be r/s her f/u appt with Dr. Welton Flakes to an earlier date and she will be receiving a call giving new date and time of appt.  Received verbal understanding. Contact information given.

## 2011-07-10 ENCOUNTER — Telehealth: Payer: Self-pay | Admitting: *Deleted

## 2011-07-10 NOTE — Telephone Encounter (Signed)
patient confirmed over the phone the new date and time on 08-21-2011 at 1:00pm

## 2011-07-27 ENCOUNTER — Encounter (HOSPITAL_BASED_OUTPATIENT_CLINIC_OR_DEPARTMENT_OTHER): Payer: Self-pay | Admitting: *Deleted

## 2011-07-27 NOTE — Progress Notes (Addendum)
Bring all medications. Coming tomorrow for CBC,Diff, BMET, EKG and CXR. Requested Sleep study from Dr. Amedeo Kinsman office. Pt states she does not use her CPAP.Bring CPAP.

## 2011-07-28 ENCOUNTER — Ambulatory Visit
Admission: RE | Admit: 2011-07-28 | Discharge: 2011-07-28 | Disposition: A | Discharge status: Home / Self Care | Payer: Federal, State, Local not specified - PPO | Source: Ambulatory Visit | Attending: Surgery | Admitting: Surgery

## 2011-07-28 ENCOUNTER — Encounter (HOSPITAL_BASED_OUTPATIENT_CLINIC_OR_DEPARTMENT_OTHER)
Admission: RE | Admit: 2011-07-28 | Discharge: 2011-07-28 | Disposition: A | Discharge status: Home / Self Care | Payer: Federal, State, Local not specified - PPO | Source: Ambulatory Visit | Attending: Surgery | Admitting: Surgery

## 2011-07-28 LAB — COMPREHENSIVE METABOLIC PANEL
ALT: 29 U/L (ref 0–35)
AST: 27 U/L (ref 0–37)
Albumin: 4.4 g/dL (ref 3.5–5.2)
Alkaline Phosphatase: 94 U/L (ref 39–117)
BUN: 19 mg/dL (ref 6–23)
CO2: 24 mEq/L (ref 19–32)
Calcium: 9.5 mg/dL (ref 8.4–10.5)
Chloride: 101 mEq/L (ref 96–112)
Creatinine, Ser: 0.93 mg/dL (ref 0.50–1.10)
GFR calc Af Amer: 75 mL/min — ABNORMAL LOW (ref >90)
GFR calc non Af Amer: 65 mL/min — ABNORMAL LOW (ref >90)
Glucose, Bld: 179 mg/dL — ABNORMAL HIGH (ref 70–99)
Potassium: 3.2 mEq/L — ABNORMAL LOW (ref 3.5–5.1)
Sodium: 141 mEq/L (ref 135–145)
Total Bilirubin: 0.9 mg/dL (ref 0.3–1.2)
Total Protein: 8 g/dL (ref 6.0–8.3)

## 2011-07-28 LAB — DIFFERENTIAL
Basophils Absolute: 0 10*3/uL (ref 0.0–0.1)
Basophils Relative: 1 % (ref 0–1)
Eosinophils Absolute: 0.2 10*3/uL (ref 0.0–0.7)
Eosinophils Relative: 2 % (ref 0–5)
Lymphocytes Relative: 30 % (ref 12–46)
Lymphs Abs: 2.3 10*3/uL (ref 0.7–4.0)
Monocytes Absolute: 0.4 10*3/uL (ref 0.1–1.0)
Monocytes Relative: 5 % (ref 3–12)
Neutro Abs: 4.9 10*3/uL (ref 1.7–7.7)
Neutrophils Relative %: 63 % (ref 43–77)

## 2011-07-28 LAB — CBC
HCT: 42.3 % (ref 36.0–46.0)
Hemoglobin: 13.8 g/dL (ref 12.0–15.0)
MCH: 30.1 pg (ref 26.0–34.0)
MCHC: 32.6 g/dL (ref 30.0–36.0)
MCV: 92.4 fL (ref 78.0–100.0)
Platelets: 264 10*3/uL (ref 150–400)
RBC: 4.58 MIL/uL (ref 3.87–5.11)
RDW: 13.4 % (ref 11.5–15.5)
WBC: 7.8 10*3/uL (ref 4.0–10.5)

## 2011-07-30 ENCOUNTER — Encounter (HOSPITAL_BASED_OUTPATIENT_CLINIC_OR_DEPARTMENT_OTHER): Payer: Self-pay | Admitting: Anesthesiology

## 2011-07-30 ENCOUNTER — Ambulatory Visit (HOSPITAL_COMMUNITY)
Admission: RE | Admit: 2011-07-30 | Discharge: 2011-07-30 | Disposition: A | Discharge status: Home / Self Care | Payer: Federal, State, Local not specified - PPO | Source: Ambulatory Visit | Attending: Surgery | Admitting: Surgery

## 2011-07-30 ENCOUNTER — Ambulatory Visit
Admission: RE | Admit: 2011-07-30 | Discharge: 2011-07-30 | Disposition: A | Discharge status: Home / Self Care | Payer: Federal, State, Local not specified - PPO | Source: Ambulatory Visit | Attending: Surgery | Admitting: Surgery

## 2011-07-30 ENCOUNTER — Other Ambulatory Visit (HOSPITAL_COMMUNITY): Payer: Federal, State, Local not specified - PPO

## 2011-07-30 ENCOUNTER — Ambulatory Visit (HOSPITAL_BASED_OUTPATIENT_CLINIC_OR_DEPARTMENT_OTHER): Payer: Federal, State, Local not specified - PPO | Admitting: Anesthesiology

## 2011-07-30 ENCOUNTER — Encounter (HOSPITAL_BASED_OUTPATIENT_CLINIC_OR_DEPARTMENT_OTHER): Payer: Self-pay

## 2011-07-30 ENCOUNTER — Ambulatory Visit (HOSPITAL_BASED_OUTPATIENT_CLINIC_OR_DEPARTMENT_OTHER)
Admission: RE | Admit: 2011-07-30 | Discharge: 2011-07-30 | Disposition: A | Discharge status: Home / Self Care | Payer: Federal, State, Local not specified - PPO | Source: Ambulatory Visit | Attending: Surgery | Admitting: Surgery

## 2011-07-30 ENCOUNTER — Encounter (HOSPITAL_BASED_OUTPATIENT_CLINIC_OR_DEPARTMENT_OTHER)
Admission: RE | Disposition: A | Discharge status: Home / Self Care | Payer: Self-pay | Source: Ambulatory Visit | Attending: Surgery

## 2011-07-30 ENCOUNTER — Encounter (HOSPITAL_BASED_OUTPATIENT_CLINIC_OR_DEPARTMENT_OTHER): Payer: Self-pay | Admitting: Surgery

## 2011-07-30 DIAGNOSIS — Z01810 Encounter for preprocedural cardiovascular examination: Secondary | ICD-10-CM | POA: Insufficient documentation

## 2011-07-30 DIAGNOSIS — C50219 Malignant neoplasm of upper-inner quadrant of unspecified female breast: Secondary | ICD-10-CM | POA: Insufficient documentation

## 2011-07-30 DIAGNOSIS — C50919 Malignant neoplasm of unspecified site of unspecified female breast: Secondary | ICD-10-CM

## 2011-07-30 DIAGNOSIS — G4730 Sleep apnea, unspecified: Secondary | ICD-10-CM | POA: Insufficient documentation

## 2011-07-30 DIAGNOSIS — Z01812 Encounter for preprocedural laboratory examination: Secondary | ICD-10-CM | POA: Insufficient documentation

## 2011-07-30 DIAGNOSIS — I10 Essential (primary) hypertension: Secondary | ICD-10-CM | POA: Insufficient documentation

## 2011-07-30 DIAGNOSIS — D0590 Unspecified type of carcinoma in situ of unspecified breast: Secondary | ICD-10-CM

## 2011-07-30 HISTORY — DX: Depression, unspecified: F32.A

## 2011-07-30 HISTORY — DX: Essential (primary) hypertension: I10

## 2011-07-30 HISTORY — PX: MASTECTOMY PARTIAL / LUMPECTOMY W/ AXILLARY LYMPHADENECTOMY: SUR852

## 2011-07-30 HISTORY — DX: Major depressive disorder, single episode, unspecified: F32.9

## 2011-07-30 LAB — POCT HEMOGLOBIN-HEMACUE: Hemoglobin: 13.9 g/dL (ref 12.0–15.0)

## 2011-07-30 SURGERY — BREAST LUMPECTOMY WITH NEEDLE LOCALIZATION AND AXILLARY SENTINEL LYMPH NODE BX
Anesthesia: General | Site: Breast | Laterality: Left | Wound class: Clean

## 2011-07-30 MED ORDER — CHLORHEXIDINE GLUCONATE 4 % EX LIQD: 1.0000 "application " | Freq: Once | CUTANEOUS | Status: DC

## 2011-07-30 MED ORDER — DEXAMETHASONE SODIUM PHOSPHATE 4 MG/ML IJ SOLN
INTRAMUSCULAR | Status: DC | PRN
  Administered 2011-07-30: 10 mg via INTRAVENOUS

## 2011-07-30 MED ORDER — BUPIVACAINE-EPINEPHRINE 0.25% -1:200000 IJ SOLN
INTRAMUSCULAR | Status: DC | PRN
  Administered 2011-07-30: 16 mL

## 2011-07-30 MED ORDER — METOCLOPRAMIDE HCL 5 MG/ML IJ SOLN
INTRAMUSCULAR | Status: DC | PRN
  Administered 2011-07-30: 10 mg via INTRAVENOUS

## 2011-07-30 MED ORDER — OXYCODONE HCL 5 MG PO TABS: 5.0000 mg | ORAL_TABLET | Freq: Once | ORAL | Status: DC | PRN

## 2011-07-30 MED ORDER — ACETAMINOPHEN 10 MG/ML IV SOLN
1000.0000 mg | Freq: Once | INTRAVENOUS | Status: AC
  Administered 2011-07-30: 1000 mg via INTRAVENOUS

## 2011-07-30 MED ORDER — CEFAZOLIN SODIUM-DEXTROSE 2-3 GM-% IV SOLR
2.0000 g | INTRAVENOUS | Status: AC
  Administered 2011-07-30: 2 g via INTRAVENOUS

## 2011-07-30 MED ORDER — HYDROMORPHONE HCL PF 1 MG/ML IJ SOLN
0.2500 mg | INTRAMUSCULAR | Status: DC | PRN
  Administered 2011-07-30: 0.25 mg via INTRAVENOUS

## 2011-07-30 MED ORDER — ONDANSETRON HCL 4 MG/2ML IJ SOLN
INTRAMUSCULAR | Status: DC | PRN
  Administered 2011-07-30: 4 mg via INTRAVENOUS

## 2011-07-30 MED ORDER — FENTANYL CITRATE 0.05 MG/ML IJ SOLN
INTRAMUSCULAR | Status: DC | PRN
  Administered 2011-07-30 (×2): 50 ug via INTRAVENOUS

## 2011-07-30 MED ORDER — MIDAZOLAM HCL 2 MG/2ML IJ SOLN
0.5000 mg | INTRAMUSCULAR | Status: DC | PRN
  Administered 2011-07-30: 2 mg via INTRAVENOUS

## 2011-07-30 MED ORDER — TECHNETIUM TC 99M SULFUR COLLOID FILTERED
1.0000 | Freq: Once | INTRAVENOUS | Status: AC | PRN
  Administered 2011-07-30: 1 via INTRADERMAL

## 2011-07-30 MED ORDER — METHYLENE BLUE 1 % INJ SOLN
INTRAMUSCULAR | Status: DC | PRN
  Administered 2011-07-30: 2 mL via INTRADERMAL

## 2011-07-30 MED ORDER — OXYCODONE-ACETAMINOPHEN 5-325 MG PO TABS: 1.0000 | ORAL_TABLET | ORAL | Status: AC | PRN

## 2011-07-30 MED ORDER — SODIUM CHLORIDE 0.9 % IJ SOLN
INTRAMUSCULAR | Status: DC | PRN
  Administered 2011-07-30: 3 mL via INTRAVENOUS

## 2011-07-30 MED ORDER — MIDAZOLAM HCL 5 MG/5ML IJ SOLN
INTRAMUSCULAR | Status: DC | PRN
  Administered 2011-07-30: 2 mg via INTRAVENOUS

## 2011-07-30 MED ORDER — FENTANYL CITRATE 0.05 MG/ML IJ SOLN
50.0000 ug | INTRAMUSCULAR | Status: DC | PRN
  Administered 2011-07-30: 100 ug via INTRAVENOUS

## 2011-07-30 MED ORDER — METOCLOPRAMIDE HCL 5 MG/ML IJ SOLN: 10.0000 mg | Freq: Once | INTRAMUSCULAR | Status: DC | PRN

## 2011-07-30 MED ORDER — PROPOFOL 10 MG/ML IV EMUL
INTRAVENOUS | Status: DC | PRN
  Administered 2011-07-30: 250 mg via INTRAVENOUS

## 2011-07-30 MED ORDER — LACTATED RINGERS IV SOLN
INTRAVENOUS | Status: DC
  Administered 2011-07-30 (×2): via INTRAVENOUS

## 2011-07-30 SURGICAL SUPPLY — 58 items
ADH SKN CLS APL DERMABOND .7 (GAUZE/BANDAGES/DRESSINGS) ×2
APPLIER CLIP 9.375 MED OPEN (MISCELLANEOUS) ×2
APR CLP MED 9.3 20 MLT OPN (MISCELLANEOUS) ×1
BANDAGE ELASTIC 6 VELCRO ST LF (GAUZE/BANDAGES/DRESSINGS) IMPLANT
BINDER BREAST XLRG (GAUZE/BANDAGES/DRESSINGS) ×2 IMPLANT
BLADE SURG 10 STRL SS (BLADE) ×2 IMPLANT
BLADE SURG 15 STRL LF DISP TIS (BLADE) ×1 IMPLANT
BLADE SURG 15 STRL SS (BLADE) ×2
BLADE SURG ROTATE 9660 (MISCELLANEOUS) IMPLANT
CANISTER SUCTION 1200CC (MISCELLANEOUS) ×2 IMPLANT
CHLORAPREP W/TINT 26ML (MISCELLANEOUS) ×2 IMPLANT
CLIP APPLIE 9.375 MED OPEN (MISCELLANEOUS) ×1 IMPLANT
CLOTH BEACON ORANGE TIMEOUT ST (SAFETY) ×2 IMPLANT
COVER MAYO STAND STRL (DRAPES) ×2 IMPLANT
COVER PROBE W GEL 5X96 (DRAPES) ×2 IMPLANT
COVER TABLE BACK 60X90 (DRAPES) ×2 IMPLANT
DECANTER SPIKE VIAL GLASS SM (MISCELLANEOUS) IMPLANT
DERMABOND ADVANCED (GAUZE/BANDAGES/DRESSINGS) ×2
DERMABOND ADVANCED .7 DNX12 (GAUZE/BANDAGES/DRESSINGS) ×2 IMPLANT
DEVICE DUBIN W/COMP PLATE 8390 (MISCELLANEOUS) IMPLANT
DRAIN CHANNEL 19F RND (DRAIN) IMPLANT
DRAIN HEMOVAC 1/8 X 5 (WOUND CARE) IMPLANT
DRAPE LAPAROSCOPIC ABDOMINAL (DRAPES) ×2 IMPLANT
DRAPE UTILITY XL STRL (DRAPES) ×2 IMPLANT
ELECT COATED BLADE 2.86 ST (ELECTRODE) ×2 IMPLANT
ELECT REM PT RETURN 9FT ADLT (ELECTROSURGICAL) ×2
ELECTRODE REM PT RTRN 9FT ADLT (ELECTROSURGICAL) ×1 IMPLANT
EVACUATOR SILICONE 100CC (DRAIN) IMPLANT
GAUZE SPONGE 4X4 12PLY STRL LF (GAUZE/BANDAGES/DRESSINGS) IMPLANT
GLOVE BIO SURGEON STRL SZ7.5 (GLOVE) ×1 IMPLANT
GLOVE BIOGEL PI IND STRL 8 (GLOVE) ×2 IMPLANT
GLOVE BIOGEL PI INDICATOR 8 (GLOVE) ×2
GLOVE ECLIPSE 8.0 STRL XLNG CF (GLOVE) ×2 IMPLANT
GOWN PREVENTION PLUS XLARGE (GOWN DISPOSABLE) ×2 IMPLANT
HEMOSTAT SURGICEL 2X14 (HEMOSTASIS) ×2 IMPLANT
KIT MARKER MARGIN INK (KITS) ×2 IMPLANT
NDL HYPO 25X1 1.5 SAFETY (NEEDLE) ×2 IMPLANT
NDL SAFETY ECLIPSE 18X1.5 (NEEDLE) ×1 IMPLANT
NEEDLE HYPO 18GX1.5 SHARP (NEEDLE) ×2
NEEDLE HYPO 25X1 1.5 SAFETY (NEEDLE) ×4 IMPLANT
NS IRRIG 1000ML POUR BTL (IV SOLUTION) ×2 IMPLANT
PACK BASIN DAY SURGERY FS (CUSTOM PROCEDURE TRAY) ×2 IMPLANT
PENCIL BUTTON HOLSTER BLD 10FT (ELECTRODE) ×2 IMPLANT
PIN SAFETY STERILE (MISCELLANEOUS) IMPLANT
SLEEVE SCD COMPRESS KNEE MED (MISCELLANEOUS) ×2 IMPLANT
SPONGE LAP 18X18 X RAY DECT (DISPOSABLE) IMPLANT
SPONGE LAP 4X18 X RAY DECT (DISPOSABLE) ×2 IMPLANT
SUT ETHILON 3 0 PS 1 (SUTURE) IMPLANT
SUT MNCRL AB 3-0 PS2 18 (SUTURE) ×4 IMPLANT
SUT SILK 2 0 SH (SUTURE) IMPLANT
SUT VICRYL 3-0 CR8 SH (SUTURE) ×2 IMPLANT
SYR BULB 3OZ (MISCELLANEOUS) ×2 IMPLANT
SYR CONTROL 10ML LL (SYRINGE) ×4 IMPLANT
TOWEL OR 17X24 6PK STRL BLUE (TOWEL DISPOSABLE) ×2 IMPLANT
TOWEL OR NON WOVEN STRL DISP B (DISPOSABLE) ×2 IMPLANT
TUBE CONNECTING 20X1/4 (TUBING) ×2 IMPLANT
WATER STERILE IRR 1000ML POUR (IV SOLUTION) ×2 IMPLANT
YANKAUER SUCT BULB TIP NO VENT (SUCTIONS) ×2 IMPLANT

## 2011-07-30 NOTE — Anesthesia Procedure Notes (Signed)
Procedure Name: LMA Insertion Date/Time: 07/30/2011 3:45 PM Performed by: Burna Cash Pre-anesthesia Checklist: Patient identified, Emergency Drugs available, Suction available and Patient being monitored Patient Re-evaluated:Patient Re-evaluated prior to inductionOxygen Delivery Method: Circle System Utilized Preoxygenation: Pre-oxygenation with 100% oxygen Intubation Type: IV induction Ventilation: Mask ventilation without difficulty LMA: LMA inserted LMA Size: 4.0 Number of attempts: 1 Airway Equipment and Method: bite block Placement Confirmation: positive ETCO2 Tube secured with: Tape Dental Injury: Teeth and Oropharynx as per pre-operative assessment

## 2011-07-30 NOTE — Transfer of Care (Signed)
Immediate Anesthesia Transfer of Care Note  Patient: Katelyn Walsh  Procedure(s) Performed: Procedure(s) (LRB): BREAST LUMPECTOMY WITH NEEDLE LOCALIZATION AND AXILLARY SENTINEL LYMPH NODE BX (Left)  Patient Location: PACU  Anesthesia Type: General  Level of Consciousness: sedated  Airway & Oxygen Therapy: Patient Spontanous Breathing and Patient connected to face mask oxygen  Post-op Assessment: Report given to PACU RN and Post -op Vital signs reviewed and stable  Post vital signs: Reviewed and stable  Complications: No apparent anesthesia complications

## 2011-07-30 NOTE — Anesthesia Preprocedure Evaluation (Addendum)
Anesthesia Evaluation  Patient identified by MRN, date of birth, ID band Patient awake    Reviewed: Allergy & Precautions, H&P , NPO status , Patient's Chart, lab work & pertinent test results, reviewed documented beta blocker date and time   Airway Mallampati: II TM Distance: >3 FB Neck ROM: full    Dental   Pulmonary asthma , sleep apnea and Continuous Positive Airway Pressure Ventilation ,          Cardiovascular hypertension, On Medications     Neuro/Psych  Headaches, PSYCHIATRIC DISORDERS negative neurological ROS     GI/Hepatic negative GI ROS, Neg liver ROS,   Endo/Other  negative endocrine ROSMorbid obesity  Renal/GU negative Renal ROS  negative genitourinary   Musculoskeletal   Abdominal   Peds  Hematology negative hematology ROS (+)   Anesthesia Other Findings See surgeon's H&P   Reproductive/Obstetrics negative OB ROS                           Anesthesia Physical  Anesthesia Plan  ASA: III  Anesthesia Plan: General   Post-op Pain Management:    Induction: Intravenous  Airway Management Planned: LMA  Additional Equipment:   Intra-op Plan:   Post-operative Plan: Extubation in OR  Informed Consent: I have reviewed the patients History and Physical, chart, labs and discussed the procedure including the risks, benefits and alternatives for the proposed anesthesia with the patient or authorized representative who has indicated his/her understanding and acceptance.   Dental Advisory Given  Plan Discussed with: CRNA and Surgeon  Anesthesia Plan Comments:         Anesthesia Quick Evaluation  

## 2011-07-30 NOTE — Discharge Instructions (Addendum)
[facility] °Office Phone Number [phone] ° °BREAST BIOPSY/ PARTIAL MASTECTOMY: POST OP INSTRUCTIONS ° °Always review your discharge instruction sheet given to you by the facility where your surgery was performed. ° °IF YOU HAVE DISABILITY OR FAMILY LEAVE FORMS, YOU MUST BRING THEM TO THE OFFICE FOR PROCESSING.  DO NOT GIVE THEM TO YOUR DOCTOR. ° °1. A prescription for pain medication may be given to you upon discharge.  Take your pain medication as prescribed, if needed.  If narcotic pain medicine is not needed, then you may take acetaminophen (Tylenol) or ibuprofen (Advil) as needed. °2. Take your usually prescribed medications unless otherwise directed °3. If you need a refill on your pain medication, please contact your pharmacy.  They will contact our office to request authorization.  Prescriptions will not be filled after 5pm or on week-ends. °4. You should eat very light the first 24 hours after surgery, such as soup, crackers, pudding, etc.  Resume your normal diet the day after surgery. °5. Most patients will experience some swelling and bruising in the breast.  Ice packs and a good support bra will help.  Swelling and bruising can take several days to resolve.  °6. It is common to experience some constipation if taking pain medication after surgery.  Increasing fluid intake and taking a stool softener will usually help or prevent this problem from occurring.  A mild laxative (Milk of Magnesia or Miralax) should be taken according to package directions if there are no bowel movements after 48 hours. °7. Unless discharge instructions indicate otherwise, you may remove your bandages 24-48 hours after surgery, and you may shower at that time.  You may have steri-strips (small skin tapes) in place directly over the incision.  These strips should be left on the skin for 7-10 days.  If your surgeon used skin glue on the incision, you may shower in 24 hours.  The glue will flake off over the  next 2-3 weeks.  Any sutures or staples will be removed at the office during your follow-up visit. °8. ACTIVITIES:  You may resume regular daily activities (gradually increasing) beginning the next day.  Wearing a good support bra or sports bra minimizes pain and swelling.  You may have sexual intercourse when it is comfortable. °a. You may drive when you no longer are taking prescription pain medication, you can comfortably wear a seatbelt, and you can safely maneuver your car and apply brakes. °b. RETURN TO WORK:  ______________________________________________________________________________________ °9. You should see your doctor in the office for a follow-up appointment approximately two weeks after your surgery.  Your doctor’s nurse will typically make your follow-up appointment when she calls you with your pathology report.  Expect your pathology report 2-3 business days after your surgery.  You may call to check if you do not hear from us after three days. °10. OTHER INSTRUCTIONS: _______________________________________________________________________________________________ _____________________________________________________________________________________________________________________________________ °_____________________________________________________________________________________________________________________________________ °_____________________________________________________________________________________________________________________________________ ° °WHEN TO CALL YOUR DOCTOR: °1. Fever over 101.0 °2. Nausea and/or vomiting. °3. Extreme swelling or bruising. °4. Continued bleeding from incision. °5. Increased pain, redness, or drainage from the incision. ° °The clinic staff is available to answer your questions during regular business hours.  Please don’t hesitate to call and ask to speak to one of the nurses for clinical concerns.  If you have a medical emergency, go to the nearest  emergency room or call 911.  A surgeon from [facility] is always on call at the hospital. ° °For further questions, please visit [email or web address]  ° ° °  Post Anesthesia Home Care Instructions ° °Activity: °Get plenty of rest for the remainder of the day. A responsible adult should stay with you for 24 hours following the procedure.  °For the next 24 hours, DO NOT: °-Drive a car °-Operate machinery °-Drink alcoholic beverages °-Take any medication unless instructed by your physician °-Make any legal decisions or sign important papers. ° °Meals: °Start with liquid foods such as gelatin or soup. Progress to regular foods as tolerated. Avoid greasy, spicy, heavy foods. If nausea and/or vomiting occur, drink only clear liquids until the nausea and/or vomiting subsides. Call your physician if vomiting continues. ° °Special Instructions/Symptoms: °Your throat may feel dry or sore from the anesthesia or the breathing tube placed in your throat during surgery. If this causes discomfort, gargle with warm salt water. The discomfort should disappear within 24 hours. ° ° °Call your surgeon if you experience:  ° °1.  Fever over 101.0. °2.  Inability to urinate. °3.  Nausea and/or vomiting. °4.  Extreme swelling or bruising at the surgical site. °5.  Continued bleeding from the incision. °6.  Increased pain, redness or drainage from the incision. °7.  Problems related to your pain medication. ° ° °

## 2011-07-30 NOTE — H&P (View-Only) (Signed)
Patient ID: Katelyn Walsh, female   DOB: 1950-05-05, 61 y.o.   MRN: [id number]  No chief complaint on file.   HPI Katelyn Walsh is a 61 y.o. female.  Pt presents with abnormal mammogram and left breast mass at the request of Dr Valentina Lucks. Pt denies breast pain,  Mass or discharge. HPI  Past Medical History  Diagnosis Date  . Back pain   . Migraines   . Seasonal allergies     Past Surgical History  Procedure Date  . Tonsillectomy     Family History  Problem Relation Age of Onset  . Cancer Father     brain    Social History History  Substance Use Topics  . Smoking status: Never Smoker   . Smokeless tobacco: Not on file  . Alcohol Use: Yes    No Known Allergies  Current Outpatient Prescriptions  Medication Sig Dispense Refill  . aspirin 81 MG tablet Take 81 mg by mouth daily.      . cetirizine (ZYRTEC) 10 MG tablet Take 10 mg by mouth daily.      . DULoxetine (CYMBALTA) 60 MG capsule Take 60 mg by mouth daily.      [name] Kitchen losartan-hydrochlorothiazide (HYZAAR) 100-25 MG per tablet Take 1 tablet by mouth daily.      [name] Kitchen MISC NATURAL PRODUCTS ER PO Take 1 capsule by mouth daily. Align probiotic      . montelukast (SINGULAIR) 10 MG tablet Take 10 mg by mouth at bedtime.      . multivitamin-iron-minerals-folic acid (CENTRUM) chewable tablet Chew 1 tablet by mouth daily.      . simvastatin (ZOCOR) 40 MG tablet Take 40 mg by mouth at bedtime.      . topiramate (TOPAMAX) 100 MG tablet Take 100 mg by mouth 2 (two) times daily.        Review of Systems Review of Systems  Constitutional: Negative for fever, chills and unexpected weight change.  HENT: Negative for hearing loss, congestion, sore throat, trouble swallowing and voice change.   Eyes: Negative for visual disturbance.  Respiratory: Negative for cough and wheezing.   Cardiovascular: Negative for chest pain, palpitations and leg swelling.  Gastrointestinal: Negative for nausea, vomiting, abdominal pain, diarrhea, constipation,  blood in stool, abdominal distention and anal bleeding.  Genitourinary: Negative for hematuria, vaginal bleeding and difficulty urinating.  Musculoskeletal: Negative for arthralgias.  Skin: Negative for rash and wound.  Neurological: Negative for seizures, syncope and headaches.  Hematological: Negative for adenopathy. Does not bruise/bleed easily.  Psychiatric/Behavioral: Negative for confusion.    There were no vitals taken for this visit.  Physical Exam Physical Exam  Constitutional: She appears well-developed and well-nourished.  HENT:  Head: Normocephalic and atraumatic.  Eyes: EOM are normal. Pupils are equal, round, and reactive to light.  Neck: Normal range of motion. Neck supple.  Cardiovascular: Normal rate and regular rhythm.   Pulmonary/Chest: Effort normal and breath sounds normal.       Left breast mass 1 cmm 12 oclock 3 cm from nipple.  No nipple discharge.  Right breast normal   Abdominal: Soft.  Musculoskeletal: Normal range of motion.  Lymphadenopathy:    She has no cervical adenopathy.    She has no axillary adenopathy.  Skin: Skin is warm and dry.  Psychiatric: She has a normal mood and affect. Her behavior is normal. Judgment and thought content normal.    Data Reviewed Left breast mammogram and MRI 1 cm mass central upper breast invasive mammary  carcinoma ER POS PR POS Her 2 neu negative.  Assessment    Stage 1 left breast cancer    Plan    Pt wishes breast conservation.  Left breast lumpectomy and left SLN mapping  Recommended.  The procedure has been discussed with the patient. Alternatives to surgery have been discussed with the patient.  Risks of surgery include bleeding,  Infection,  Seroma formation, death,  and the need for further surgery.   The patient understands and wishes to proceed.Sentinel lymph node mapping and dissection has been discussed with the patient.  Risk of bleeding,  Infection,  Seroma formation,  Additional procedures,,   Shoulder weakness ,  Shoulder stiffness,  Nerve and blood vessel injury and reaction to the mapping dyes have been discussed.  Alternatives to surgery have been discussed with the patient.  The patient agrees to proceed.       Jelani Vreeland A. 07/01/2011, 4:00 PM

## 2011-07-30 NOTE — Anesthesia Postprocedure Evaluation (Signed)
Anesthesia Post Note  Patient: Katelyn Walsh  Procedure(s) Performed: Procedure(s) (LRB): BREAST LUMPECTOMY WITH NEEDLE LOCALIZATION AND AXILLARY SENTINEL LYMPH NODE BX (Left)  Anesthesia type: General  Patient location: PACU  Post pain: Pain level controlled  Post assessment: Patient's Cardiovascular Status Stable  Last Vitals:  Filed Vitals:   07/30/11 1751  BP: 112/72  Pulse: 80  Temp: 36.2 C  Resp: 14    Post vital signs: Reviewed and stable  Level of consciousness: alert  Complications: No apparent anesthesia complications

## 2011-07-30 NOTE — Op Note (Signed)
Breast Lumpectomy left with left Sentinal Node Biopsy Procedure Note  Indications: This patient presents with history of left breast cancer with clinically negative axillary lymph node exam.  Pre-operative Diagnosis: left breast cancer  Post-operative Diagnosis: left breast cancer  Surgeon: Jaun Galluzzo A.   Assistants: OR staff  Anesthesia: General LMA anesthesia and Local anesthesia 0.25.% bupivacaine, with epinephrine  ASA Class: 2  Procedure Details  The patient was seen in the Holding Room. The risks, benefits, complications, treatment options, and expected outcomes were discussed with the patient. The possibilities of reaction to medication, pulmonary aspiration, bleeding, infection, the need for additional procedures, failure to diagnose a condition, and creating a complication requiring transfusion or operation were discussed with the patient. The patient concurred with the proposed plan, giving informed consent.  The site of surgery properly noted/marked. The patient was taken to Operating Room # 4, identified as Cassand[facility]ee and the procedure verified as Breast Lumpectomy and Sentinal Node Biopsy. A Time Out was held and the above information confirmed.  After induction of anesthesia, the left arm, breast, and chest were prepped and draped in standard fashion.  4 cc methylene blue mixed with saline injected under superior nipple. Using a hand-held gamma probe, axillary sentinel nodes were identified transcutaneously.  An oblique incision was created below the axillary hairline.  Dissection was carried through the clavipectoral fascia.  3 level 1 axillary sentinel nodes were removed and submitted to pathology.  Background counts approached zero. The axillary incision was closed with a 3-0 Vicryl   AND 4 O MONOCRYLsubcuticular closure in layers.    The lumpectomy was performed by creating an oblique incision over the upper inner quadrant of the breastaround the previously placed  localization guidewire.  Dissection was carried down to the pectoral fascia.  The tissue and wire were excised and margins  were inked.  Specimen radiography confirmed inclusion of the mammographic lesion.   Clips were used to mark the cavity.   Hemostasis was achieved with cautery.  The wound was irrigated and closed with a 3 0 Vicry  And 4 0 monocryl subcuticular closure in layers.    Sterile dressings were applied. At the end of the operation, all sponge, instrument, and needle counts were correct.  Findings: grossly clear surgical margins and no adenopathy  Estimated Blood Loss:  Minimal         Drains: none         Total IV Fluids: 600 ml         Specimens: see above         Implants: none         Complications:  None; patient tolerated the procedure well.         Disposition: PACU - hemodynamically stable.         Condition: stable  Attending Attestation: I performed the procedure.

## 2011-07-30 NOTE — Interval H&P Note (Signed)
History and Physical Interval Note:  07/30/2011 3:10 PM  Katelyn Walsh  has presented today for surgery, with the diagnosis of left breast invasive ductal carcinoma   The various methods of treatment have been discussed with the patient and family. After consideration of risks, benefits and other options for treatment, the patient has consented to  Procedure(s) (LRB): BREAST LUMPECTOMY WITH NEEDLE LOCALIZATION AND AXILLARY SENTINEL LYMPH NODE BX (Left) as a surgical intervention .  The patients' history has been reviewed, patient examined, no change in status, stable for surgery.  I have reviewed the patients' chart and labs.  Questions were answered to the patient's satisfaction.     Katelyn Walsh A.

## 2011-08-03 ENCOUNTER — Encounter: Payer: Self-pay | Admitting: *Deleted

## 2011-08-03 NOTE — Progress Notes (Signed)
Ordered Oncotype Dx test w/ [facility].  Faxed request to [facility].  Faxed PAC to [employer]. 

## 2011-08-05 ENCOUNTER — Encounter: Payer: Self-pay | Admitting: *Deleted

## 2011-08-05 NOTE — Progress Notes (Signed)
Re-faxed PAC to [facility]. 

## 2011-08-07 ENCOUNTER — Encounter (INDEPENDENT_AMBULATORY_CARE_PROVIDER_SITE_OTHER): Payer: Self-pay | Admitting: Surgery

## 2011-08-07 ENCOUNTER — Ambulatory Visit (INDEPENDENT_AMBULATORY_CARE_PROVIDER_SITE_OTHER): Payer: Federal, State, Local not specified - PPO | Admitting: Surgery

## 2011-08-07 VITALS — BP 118/80 | HR 68 | Temp 97.3°F | Resp 14 | Ht 66.0 in | Wt 223.5 lb

## 2011-08-07 DIAGNOSIS — Z9889 Other specified postprocedural states: Secondary | ICD-10-CM | POA: Insufficient documentation

## 2011-08-07 MED ORDER — DOXYCYCLINE HYCLATE 100 MG PO TABS: 100.0000 mg | ORAL_TABLET | Freq: Two times a day (BID) | ORAL | Status: AC

## 2011-08-07 NOTE — Progress Notes (Signed)
Patient returns after a left breast lumpectomy with sentinel lymph node mapping for a 1.8 cm ER positive PR is negative HER-2/neu negative stage I left breast cancer. Margins were clear. She has some irritation around both incisions on her back look like allergic reaction to the adhesive.  Exam: Mild erythema around both incisions and small rash to left flank. No abscess. Minimal drainage noted.  Impression: Stage I left breast cancer status post breast conservation therapy  Plan: And she see medical oncology next week. Refer physical therapy and radiation oncology. Return in 2 weeks.

## 2011-08-07 NOTE — Patient Instructions (Signed)
Return 2 weeks.

## 2011-08-11 ENCOUNTER — Other Ambulatory Visit (INDEPENDENT_AMBULATORY_CARE_PROVIDER_SITE_OTHER): Payer: Self-pay

## 2011-08-11 ENCOUNTER — Telehealth (INDEPENDENT_AMBULATORY_CARE_PROVIDER_SITE_OTHER): Payer: Self-pay

## 2011-08-11 DIAGNOSIS — Z9889 Other specified postprocedural states: Secondary | ICD-10-CM

## 2011-08-11 NOTE — Telephone Encounter (Signed)
error 

## 2011-08-12 ENCOUNTER — Encounter: Payer: Self-pay | Admitting: Radiation Oncology

## 2011-08-12 ENCOUNTER — Encounter: Payer: Self-pay | Admitting: *Deleted

## 2011-08-12 ENCOUNTER — Telehealth: Payer: Self-pay | Admitting: *Deleted

## 2011-08-12 ENCOUNTER — Ambulatory Visit
Admission: RE | Admit: 2011-08-12 | Discharge: 2011-08-12 | Disposition: A | Discharge status: Home / Self Care | Payer: Federal, State, Local not specified - PPO | Source: Ambulatory Visit | Attending: Radiation Oncology | Admitting: Radiation Oncology

## 2011-08-12 VITALS — BP 104/72 | HR 75 | Temp 98.0°F | Wt 225.2 lb

## 2011-08-12 DIAGNOSIS — Z510 Encounter for antineoplastic radiation therapy: Secondary | ICD-10-CM | POA: Insufficient documentation

## 2011-08-12 DIAGNOSIS — C50219 Malignant neoplasm of upper-inner quadrant of unspecified female breast: Secondary | ICD-10-CM

## 2011-08-12 DIAGNOSIS — C50919 Malignant neoplasm of unspecified site of unspecified female breast: Secondary | ICD-10-CM | POA: Insufficient documentation

## 2011-08-12 DIAGNOSIS — Z79899 Other long term (current) drug therapy: Secondary | ICD-10-CM | POA: Insufficient documentation

## 2011-08-12 NOTE — Addendum Note (Signed)
Encounter addended by: Delynn Flavin, RN on: 08/12/2011  6:12 PM<BR>     Documentation filed: Charges VN

## 2011-08-12 NOTE — Progress Notes (Signed)
[facility] visit today.  States she has throbbing of her breast.  C/o itching and reddened area on left breast.  Given Doxycycline by Dr. Luisa Hart on 08/07/11.  Reports that it is difficult to sleep due to pain and itching.  To note itching started after surgery and prior to use of Doxycyline.

## 2011-08-12 NOTE — Telephone Encounter (Signed)
Patient confirmed over here the new date and time on 08-13-2011 at 1:00pm

## 2011-08-12 NOTE — Progress Notes (Signed)
Radiation Oncology         [phone] ________________________________  Name: EULAMAE GREENSTEIN MRN: [id number]  Date: 08/12/2011  DOB: 1950/11/19  Follow-Up Visit Note  CC: [name], MD  Cornett, Clovis Pu., MD  Diagnosis:  [id number]  ER+ PR- Her2- invasive ductal carcinoma, grade 2, left breast   Narrative:  The patient returns today for followup after surgery. She underwent lumpectomy and sentinel lymph node biopsy on 07/30/2011. All 3 sentinel nodes were negative. Her tumor measured 1.8 cm, grade 2, ER/PR positive HER-2/neu negative progesterone receptor negative. The patient has clear margins. The margins are 0.3 cm from both the invasive and in situ disease. There was no lymphovascular space invasion. Since surgery, she has noted some swelling in her left breast. She has also noticed an erythematous rash that is very itchy over her breast and her upper left arm. She has tried hydrocortisone and Benadryl cream without good alleviation. She's also been taking doxycycline in case there is an infection.   ALLERGIES:   has no known allergies.  Meds: Current Outpatient Prescriptions  Medication Sig Dispense Refill  . aspirin 81 MG tablet Take 81 mg by mouth daily.      . cetirizine (ZYRTEC) 10 MG tablet Take 10 mg by mouth daily.      [name] Kitchen doxycycline (VIBRA-TABS) 100 MG tablet Take 1 tablet (100 mg total) by mouth 2 (two) times daily. For infection  14 tablet  0  . DULoxetine (CYMBALTA) 60 MG capsule Take 60 mg by mouth daily.      [name] Kitchen losartan-hydrochlorothiazide (HYZAAR) 100-25 MG per tablet Take 1 tablet by mouth daily.       [name] Kitchen MISC NATURAL PRODUCTS ER PO Take 1 capsule by mouth daily. Align probiotic       . montelukast (SINGULAIR) 10 MG tablet Take 10 mg by mouth at bedtime.      . Multiple Vitamins-Minerals (CENTRUM SILVER PO) Take by mouth daily.      [name] Kitchen oxyCODONE-acetaminophen (PERCOCET) 5-325 MG per tablet       . simvastatin (ZOCOR) 40 MG tablet Take 40 mg by mouth at  bedtime.      . topiramate (TOPAMAX) 100 MG tablet Take 100 mg by mouth 2 (two) times daily.        Physical Findings: The patient is in no acute distress. Patient is alert and oriented. Vitals 104/72 pulse 75 temperature 98 degrees  weight 225 pounds. Her left breast is swollen and erythematous. There is a palpable seroma underneath the lumpectomy scar. There is a rash, consisting of very small erythematous spots over the breast and the left upper arm. It extends over midline to the inner right breast.  Lab Findings: Lab Results  Component Value Date   WBC 7.8 07/28/2011   HGB 13.9 07/30/2011   HCT 42.3 07/28/2011   MCV 92.4 07/28/2011   PLT 264 07/28/2011    CMP     Component Value Date/Time   NA 141 07/28/2011 1615   K 3.2* 07/28/2011 1615   CL 101 07/28/2011 1615   CO2 24 07/28/2011 1615   GLUCOSE 179* 07/28/2011 1615   BUN 19 07/28/2011 1615   CREATININE 0.93 07/28/2011 1615   CALCIUM 9.5 07/28/2011 1615   PROT 8.0 07/28/2011 1615   ALBUMIN 4.4 07/28/2011 1615   AST 27 07/28/2011 1615   ALT 29 07/28/2011 1615   ALKPHOS 94 07/28/2011 1615   BILITOT 0.9 07/28/2011 1615   GFRNONAA 65* 07/28/2011 1615   GFRAA  75* 07/28/2011 1615     Radiographic Findings: Chest 2 View  07/28/2011  *RADIOLOGY REPORT*  Clinical Data: Preop for breast surgery  CHEST - 2 VIEW  Comparison: None.  Findings: The lungs are clear.  Mediastinal contours appear normal. The heart is within normal limits in size.  No bony abnormality is seen.  IMPRESSION: No active lung disease.  Original Report Authenticated By: Juline Patch, M.D.   Nm Sentinel Node Inj-no Rpt (breast)  07/30/2011  CLINICAL DATA: left breast cancer   Sulfur colloid was injected intradermally by the nuclear medicine  technologist for breast cancer sentinel node localization.     Mm Breast Surgical Specimen  07/30/2011  *RADIOLOGY REPORT*  Clinical Data:  61 year old female with left breast cancer.  Wire localization prior to left lumpectomy.  NEEDLE LOCALIZATION WITH  MAMMOGRAPHIC GUIDANCE AND SPECIMEN RADIOGRAPH  Patient presents for needle localization prior to left lumpectomy. I met with the patient and we discussed the procedure of needle localization including benefits and alternatives. We discussed the high likelihood of a successful procedure. We discussed the risks of the procedure, including infection, bleeding, tissue injury, and further surgery. Informed, written consent was given.  Using mammographic guidance, sterile technique, 2% lidocaine and a 7 cm modified Kopans needle, the biopsy clip/mass were localized using a medial approach.  The films are marked for Dr. Luisa Hart.  Specimen radiograph was performed at the [facility], and confirms the biopsy clip, wire and mass present in the tissue sample.  The specimen is marked for pathology.  IMPRESSION: Needle localization left breast.  No apparent complications.  Original Report Authenticated By: Rosendo Gros, M.D.   Mm Breast Wire Localization Left  07/30/2011  *RADIOLOGY REPORT*  Clinical Data:  61 year old female with left breast cancer.  Wire localization prior to left lumpectomy.  NEEDLE LOCALIZATION WITH MAMMOGRAPHIC GUIDANCE AND SPECIMEN RADIOGRAPH  Patient presents for needle localization prior to left lumpectomy. I met with the patient and we discussed the procedure of needle localization including benefits and alternatives. We discussed the high likelihood of a successful procedure. We discussed the risks of the procedure, including infection, bleeding, tissue injury, and further surgery. Informed, written consent was given.  Using mammographic guidance, sterile technique, 2% lidocaine and a 7 cm modified Kopans needle, the biopsy clip/mass were localized using a medial approach.  The films are marked for Dr. Luisa Hart.  Specimen radiograph was performed at the [facility], and confirms the biopsy clip, wire and mass present in the tissue sample.  The specimen is  marked for pathology.  IMPRESSION: Needle localization left breast.  No apparent complications.  Original Report Authenticated By: Rosendo Gros, M.D.    Impression/Plan: The patient is recovering from surgery. The patient's pathology report and recent imaging were reviewed at our tumor board conference today. She will be seen medical oncology in the near future to discuss her Oncotype DX score of 18. I told the patient that if she pursues chemotherapy, she'll be referred back to me for radiotherapy thereafter. If the patient is not pursue chemotherapy, I would appreciate a referral back to my clinic so that she can start radiotherapy. At that time a consent form will be signed and discussed in detail.  Ms. Saintil right now is most most bothered by her rash which appears allergic to me. She's on empiric antibiotics but the itching is still quite bothersome. I told her to try some oral Benadryl at night to help her sleep.  She's tolerated this in the past for other medical issues. I recommended 1% hydrocortisone cream topically. Otherwise, if her condition doesn't improve in the next few days, I recommended that she schedule a followup next week with her surgeon.  I very much look forward to participating in the patient's care. She seems comfortable with the plan above.  -----------------------------------  Lonie Peak, MD

## 2011-08-12 NOTE — Addendum Note (Signed)
Encounter addended by: Lonie Peak, MD on: 08/12/2011 12:40 PM<BR>     Documentation filed: Inpatient Notes

## 2011-08-12 NOTE — Progress Notes (Signed)
Received Oncotype Dx results of 18.  Gave copy to MD.  Took copy to Med Rec to scan. 

## 2011-08-13 ENCOUNTER — Ambulatory Visit (INDEPENDENT_AMBULATORY_CARE_PROVIDER_SITE_OTHER): Payer: Federal, State, Local not specified - PPO | Admitting: General Surgery

## 2011-08-13 ENCOUNTER — Other Ambulatory Visit (INDEPENDENT_AMBULATORY_CARE_PROVIDER_SITE_OTHER): Payer: Self-pay | Admitting: General Surgery

## 2011-08-13 ENCOUNTER — Encounter (INDEPENDENT_AMBULATORY_CARE_PROVIDER_SITE_OTHER): Payer: Self-pay | Admitting: General Surgery

## 2011-08-13 ENCOUNTER — Ambulatory Visit (HOSPITAL_BASED_OUTPATIENT_CLINIC_OR_DEPARTMENT_OTHER): Payer: Federal, State, Local not specified - PPO | Admitting: Oncology

## 2011-08-13 ENCOUNTER — Encounter: Payer: Self-pay | Admitting: Oncology

## 2011-08-13 ENCOUNTER — Telehealth: Payer: Self-pay | Admitting: Oncology

## 2011-08-13 VITALS — BP 130/84 | HR 80 | Temp 98.4°F | Resp 16 | Ht 66.0 in | Wt 225.2 lb

## 2011-08-13 VITALS — BP 106/71 | HR 77 | Temp 98.1°F | Wt 225.2 lb

## 2011-08-13 DIAGNOSIS — C50219 Malignant neoplasm of upper-inner quadrant of unspecified female breast: Secondary | ICD-10-CM

## 2011-08-13 DIAGNOSIS — N61 Mastitis without abscess: Secondary | ICD-10-CM

## 2011-08-13 DIAGNOSIS — R21 Rash and other nonspecific skin eruption: Secondary | ICD-10-CM

## 2011-08-13 DIAGNOSIS — Z9889 Other specified postprocedural states: Secondary | ICD-10-CM

## 2011-08-13 DIAGNOSIS — Z170 Estrogen receptor positive status [ER+]: Secondary | ICD-10-CM

## 2011-08-13 HISTORY — DX: Rash and other nonspecific skin eruption: R21

## 2011-08-13 MED ORDER — HYDROXYZINE HCL 10 MG PO TABS: 10.0000 mg | ORAL_TABLET | Freq: Three times a day (TID) | ORAL | Status: AC | PRN

## 2011-08-13 NOTE — Progress Notes (Signed)
Subjective:     Patient ID: Cassandria Santee, female   DOB: 12/27/1950, 61 y.o.   MRN: [id number]  HPI She presents to Urgent Office to have her left breast re-evaluated.  She underwent left breast lumpectomy and SLNBx two weeks ago.  She had some redness in her left breast and was empirically started on Doxycycline for a possible infection.  She is due to stop the antibiotic tomorrow and is concerned because she still has some redness in the breast.  She also has a pruritic rash on her arms and trunk and was given Atarax for this by Dr. Welton Flakes.  Review of Systems As per HPI    Objective:   Physical Exam Left breast-superior incision is clean and intact with minimal erythema, mild induration of the nipple is present; some fluid was sterilely aspirated and sent for culture. Left axillary incision is clean.  Skin-punctate rash on the arms, and some areas of the trunk    Assessment:     Possible wound infection versus reaction to methylene blue or radioactive injection-the wound does not look infected to me today, it looks more reactive.    Plan:     Keep appointment with Dr. Luisa Hart.  Await culture results.  Take Benadryl for 24 hours instead of Atarax to see if it helps with the rash and itching.

## 2011-08-13 NOTE — Progress Notes (Signed)
OFFICE PROGRESS NOTE  CC  Katelyn Mountain, MD [address] [employer], [place]. [place] [address] Dr. Lonie Walsh Dr. Harriette Walsh  DIAGNOSIS: 61 year old female with new diagnosis of stage I invasive ductal carcinoma of the left breast diagnosed in May 2013.  PRIOR THERAPY:  #1 patient was originally seen in the multidisciplinary breast clinic after she had a ultrasound core needle biopsy performed that showed a invasive ductal carcinoma of the left breast at the 10:00 position. Since then she has gone on to have a lumpectomy with sentinel node biopsy. Her final pathology Showed a 1.8 cm invasive ductal carcinoma that was ER positive 100% PR Negative HER-2/neu negative grade 2 with KI 67 9%. Patient had 3 sentinel nodes that were negative for metastatic disease.  #2 patient had an Oncotype DX testing performed that showed the recurrence score Of 18. Giving her 11% risk of recurrence at 5 years with tamoxifen alone. This put her in the low-risk category. And does only antiestrogen therapy is being recommended after radiation.  #3 patient was seen by Dr. Doristine Walsh who is planning on doing radiation.  CURRENT THERAPY:Patient will proceed with radiation therapy.  INTERVAL HISTORY: Katelyn Walsh 61 y.o. female returns for Followup visit after her surgery. Postoperatively patient seems to be doing well she and I went over her pathology results. Today she is really without any complaints her incision is healing quite well. She denies any fevers chills night sweats headaches shortness of breath chest pains palpitations she has no myalgias or arthralgias. Remainder of the 10 point review of systems is negative.  MEDICAL HISTORY: Past Medical History  Diagnosis Date  . Back pain   . Migraines   . Seasonal allergies   . Hypertension   . Asthma     allergies  . Sleep apnea     has not used CPAP in two years  . Cancer     left breast  .  Depression     ALLERGIES:   has no known allergies.  MEDICATIONS:  Current Outpatient Prescriptions  Medication Sig Dispense Refill  . aspirin 81 MG tablet Take 81 mg by mouth daily.      . cetirizine (ZYRTEC) 10 MG tablet Take 10 mg by mouth daily.      [name] Kitchen doxycycline (VIBRA-TABS) 100 MG tablet Take 1 tablet (100 mg total) by mouth 2 (two) times daily. For infection  14 tablet  0  . DULoxetine (CYMBALTA) 60 MG capsule Take 60 mg by mouth daily.      [name] Kitchen losartan-hydrochlorothiazide (HYZAAR) 100-25 MG per tablet Take 1 tablet by mouth daily.       [name] Kitchen MISC NATURAL PRODUCTS ER PO Take 1 capsule by mouth daily. Align probiotic       . montelukast (SINGULAIR) 10 MG tablet Take 10 mg by mouth at bedtime.      . Multiple Vitamins-Minerals (CENTRUM SILVER PO) Take by mouth daily.      [name] Kitchen oxyCODONE-acetaminophen (PERCOCET) 5-325 MG per tablet       . simvastatin (ZOCOR) 40 MG tablet Take 40 mg by mouth at bedtime.      . topiramate (TOPAMAX) 100 MG tablet Take 100 mg by mouth 2 (two) times daily.        SURGICAL HISTORY:  Past Surgical History  Procedure Date  . Tonsillectomy     as child  . Colonoscopy   . Mastectomy partial / lumpectomy w/ axillary lymphadenectomy 07/31/11  Left Breast : Invasive Ductal Carcinoma: 0/3  Nodes Negative: ER 100%, PR 0%, Her 2Neu Negative    REVIEW OF SYSTEMS:  Pertinent items are noted in HPI.   PHYSICAL EXAMINATION: General appearance: alert, cooperative and appears stated age Neck: no adenopathy, no carotid bruit, no JVD, supple, symmetrical, trachea midline and thyroid not enlarged, symmetric, no tenderness/mass/nodules Lymph nodes: Cervical, supraclavicular, and axillary nodes normal. Resp: clear to auscultation bilaterally and normal percussion bilaterally Back: symmetric, no curvature. ROM normal. No CVA tenderness. Cardio: regular rate and rhythm, S1, S2 normal, no murmur, click, rub or gallop GI: soft, non-tender; bowel sounds normal; no masses,  no  organomegaly Extremities: extremities normal, atraumatic, no cyanosis or edema Neurologic: Grossly normal  ECOG PERFORMANCE STATUS: 0 - Asymptomatic  Blood pressure 106/71, pulse 77, temperature 98.1 F (36.7 C), temperature source Oral, weight 225 lb 4 oz (102.173 kg).  LABORATORY DATA: Lab Results  Component Value Date   WBC 7.8 07/28/2011   HGB 13.9 07/30/2011   HCT 42.3 07/28/2011   MCV 92.4 07/28/2011   PLT 264 07/28/2011      Chemistry      Component Value Date/Time   NA 141 07/28/2011 1615   K 3.2* 07/28/2011 1615   CL 101 07/28/2011 1615   CO2 24 07/28/2011 1615   BUN 19 07/28/2011 1615   CREATININE 0.93 07/28/2011 1615      Component Value Date/Time   CALCIUM 9.5 07/28/2011 1615   ALKPHOS 94 07/28/2011 1615   AST 27 07/28/2011 1615   ALT 29 07/28/2011 1615   BILITOT 0.9 07/28/2011 1615     REASON FOR ADDENDUM, AMENDMENT OR CORRECTION: [id number]: Oncotype result 08/12/11 02:17:51 PM (gt) FINAL DIAGNOSIS Diagnosis 1. Lymph node, sentinel, biopsy, Left axilla - ONE LYMPH NODE, NEGATIVE FOR TUMOR (0/1) 2. Lymph node, sentinel, biopsy, Left axilla - ONE LYMPH NODE, NEGATIVE FOR TUMOR (0/1) 3. Lymph node, sentinel, biopsy, Left axilla - ONE LYMPH NODE, NEGATIVE FOR TUMOR (0/1) 4. Breast, lumpectomy, Left - INVASIVE DUCTAL CARCINOMA, GRADE II (1.8 CM). - NO LYMPHOVASCULAR INVASION IDENTIFIED. - INVASIVE TUMOR IS 0.3 CM FROM NEAREST MARGIN (POSTERIOR). - DUCTAL CARCINOMA IN SITU. - SEE TUMOR SYNOPTIC TEMPLATE BELOW. Microscopic Comment 4. BREAST, INVASIVE TUMOR, WITH LYMPH NODE SAMPLING Specimen, including laterality: Left breast Procedure: Lumpectomy Grade: II of III Tubule formation: 3 Nuclear pleomorphism: 2 Mitotic:1 Tumor size (gross measurement): 1.8 cm Margins: Invasive, distance to closest margin: 0.3 cm In-situ, distance to closest margin: 0.3 cm (posterior) If margin positive, focally or broadly: N/A Lymphovascular invasion: Absent Ductal carcinoma in situ:  Present Grade: II-III 1 of 3 Amended copy Addendum FINAL for Walsh, Katelyn A ([id number]) Microscopic Comment(continued) Extensive intraductal component: Absent Lobular neoplasia: Absent Tumor focality: Unifocal Treatment effect: None If present, treatment effect in breast tissue, lymph nodes or both: N/A Extent of tumor: Skin: N/A Nipple: N/A Skeletal muscle: N/A Lymph nodes: # examined: 3 Lymph nodes with metastasis: 0 Breast prognostic profile: Estrogen receptor: Not repeated, previous study demonstrated 100% positivity ([id number]) Progesterone receptor: Not repeated, previous study demonstrated 0% positivity ([id number]) Her 2 neu: Repeated, previous study demonstrated no amplification ([id number]) Ki-67: Not repeated, previous study demonstrated 9% proliferation rate ([id number]) Non-neoplastic breast: Radial/complex sclerosis with calcifications, microcalcifications and benign ducts and lobules, and previous biopsy site TNM: pT1c, pN0, pMX (CR:mw 08-03-11) ADDENDUM: Per the oncotype DX report, the breast cancer recurrent score is 18. Patient's with a recurrent score of 18 had an average rate of distance recurrence of 11% (see oncotype  report, [id number]) for the entire interpretation. (CRR:gt, 08/12/11) [place] RUND DO Pathologist, Electronic Signature (Case signed 08/12/2011) Specimen Gross and Clinical  RADIOGRAPHIC STUDIES:  Chest 2 View  07/28/2011  *RADIOLOGY REPORT*  Clinical Data: Preop for breast surgery  CHEST - 2 VIEW  Comparison: None.  Findings: The lungs are clear.  Mediastinal contours appear normal. The heart is within normal limits in size.  No bony abnormality is seen.  IMPRESSION: No active lung disease.  Original Report Authenticated By: Juline Patch, M.D.   Nm Sentinel Node Inj-no Rpt (breast)  07/30/2011  CLINICAL DATA: left breast cancer   Sulfur colloid was injected intradermally by the nuclear medicine  technologist for breast cancer sentinel node  localization.     Mm Breast Surgical Specimen  07/30/2011  *RADIOLOGY REPORT*  Clinical Data:  61 year old female with left breast cancer.  Wire localization prior to left lumpectomy.  NEEDLE LOCALIZATION WITH MAMMOGRAPHIC GUIDANCE AND SPECIMEN RADIOGRAPH  Patient presents for needle localization prior to left lumpectomy. I met with the patient and we discussed the procedure of needle localization including benefits and alternatives. We discussed the high likelihood of a successful procedure. We discussed the risks of the procedure, including infection, bleeding, tissue injury, and further surgery. Informed, written consent was given.  Using mammographic guidance, sterile technique, 2% lidocaine and a 7 cm modified Kopans needle, the biopsy clip/mass were localized using a medial approach.  The films are marked for Dr. Luisa Hart.  Specimen radiograph was performed at the [facility], and confirms the biopsy clip, wire and mass present in the tissue sample.  The specimen is marked for pathology.  IMPRESSION: Needle localization left breast.  No apparent complications.  Original Report Authenticated By: Rosendo Gros, M.D.   Mm Breast Wire Localization Left  07/30/2011  *RADIOLOGY REPORT*  Clinical Data:  61 year old female with left breast cancer.  Wire localization prior to left lumpectomy.  NEEDLE LOCALIZATION WITH MAMMOGRAPHIC GUIDANCE AND SPECIMEN RADIOGRAPH  Patient presents for needle localization prior to left lumpectomy. I met with the patient and we discussed the procedure of needle localization including benefits and alternatives. We discussed the high likelihood of a successful procedure. We discussed the risks of the procedure, including infection, bleeding, tissue injury, and further surgery. Informed, written consent was given.  Using mammographic guidance, sterile technique, 2% lidocaine and a 7 cm modified Kopans needle, the biopsy clip/mass were localized using a medial  approach.  The films are marked for Dr. Luisa Hart.  Specimen radiograph was performed at the [facility], and confirms the biopsy clip, wire and mass present in the tissue sample.  The specimen is marked for pathology.  IMPRESSION: Needle localization left breast.  No apparent complications.  Original Report Authenticated By: Rosendo Gros, M.D.    ASSESSMENT: 61 year old female with  #1 stage IA invasive ductal carcinoma ER positive PR negative HER-2/neu negative with Ki-67 9%. Patient is now status post lumpectomy with sentinel node biopsy. The final pathology showed a 1.8 cm tumor 3 sentinel nodes were negative for metastatic disease. She had Oncotype DX testing performed that gave her a score of 18 with her 5 year recurrence at about 11% and in the low risk category.  #2 patient has been seen by radiation oncology and post lumpectomy radiation is planned.   PLAN:   #1 patient will proceed with radiation therapy. Once this is completed then I discussed the role of antiestrogen therapy. I will recommend an aromatase inhibitor such  as Arimidex. Risks and benefits and rationale of this therapy were discussed with the patient in detail. She demonstrated understanding.  #2 I will plan on seeing her back in about 2 months time for followup.   All questions were answered. The patient knows to call the clinic with any problems, questions or concerns. We can certainly see the patient much sooner if necessary.  I spent 30 minutes counseling the patient face to face. The total time spent in the appointment was 30 minutes.    Drue Second, MD Medical/Oncology [facility] [phone] (beeper) [phone] (Office)  08/13/2011, 2:18 PM

## 2011-08-13 NOTE — Telephone Encounter (Signed)
gve the pt her aug 2013 appt calendar 

## 2011-08-13 NOTE — Patient Instructions (Signed)
Remove bandage tomorrow.  Take Benadryl for 24 hours instead of Atarax to see if it helps with the itching and rash.

## 2011-08-13 NOTE — Patient Instructions (Addendum)
1. Take atarax for itiching every 8 hours as needed  2. Follow up with Dr. Luisa Hart  3. You do not need chemotherapy. You wil start the anti-estrogen after you complete your radiation  4. I will see you back in August

## 2011-08-19 LAB — WOUND CULTURE
Gram Stain: NONE SEEN
Gram Stain: NONE SEEN
Gram Stain: NONE SEEN
Organism ID, Bacteria: NO GROWTH

## 2011-08-20 ENCOUNTER — Ambulatory Visit: Payer: Federal, State, Local not specified - PPO | Attending: Surgery | Admitting: Physical Therapy

## 2011-08-20 DIAGNOSIS — IMO0001 Reserved for inherently not codable concepts without codable children: Secondary | ICD-10-CM | POA: Insufficient documentation

## 2011-08-20 DIAGNOSIS — C50919 Malignant neoplasm of unspecified site of unspecified female breast: Secondary | ICD-10-CM | POA: Insufficient documentation

## 2011-08-20 DIAGNOSIS — M25519 Pain in unspecified shoulder: Secondary | ICD-10-CM | POA: Insufficient documentation

## 2011-08-21 ENCOUNTER — Ambulatory Visit: Payer: Federal, State, Local not specified - PPO | Admitting: Oncology

## 2011-08-25 ENCOUNTER — Ambulatory Visit (INDEPENDENT_AMBULATORY_CARE_PROVIDER_SITE_OTHER): Payer: Federal, State, Local not specified - PPO | Admitting: Surgery

## 2011-08-25 ENCOUNTER — Encounter (INDEPENDENT_AMBULATORY_CARE_PROVIDER_SITE_OTHER): Payer: Self-pay | Admitting: Surgery

## 2011-08-25 VITALS — BP 126/82 | HR 70 | Temp 97.9°F | Resp 16 | Ht 66.0 in | Wt 215.2 lb

## 2011-08-25 DIAGNOSIS — Z9889 Other specified postprocedural states: Secondary | ICD-10-CM

## 2011-08-25 NOTE — Progress Notes (Signed)
Subjective:     Patient ID: Katelyn Walsh, female   DOB: 09-07-1950, 61 y.o.   MRN: [id number]  HPI Patient returns after left breast lumpectomy was similar for mapping for stage I left breast cancer. She had a problem with a rash on her left breast which improved on prednisone. She had been on doxycycline but this did not help it. This was probably a reaction to the prep.  Review of Systems  Constitutional: Negative.   HENT: Negative.   Eyes: Negative.   Respiratory: Negative.        Objective:   Physical Exam  Constitutional: She appears well-developed and well-nourished.  HENT:  Head: Normocephalic and atraumatic.  Eyes: EOM are normal. Pupils are equal, round, and reactive to light.  Pulmonary/Chest:       Left breast shows no redness or rash. Both incisions clean dry and intact. 2 mm separation noted and sentinel lymph node incision but no significant drainage. Small amount of drainage on her bra noted which is serosanguineous. Rash resolved. Nipple with mild tenderness.       Assessment:     Status post left breast lumpectomy with sentinel lymph node mapping for stage I left breast cancer complicated by postop rash which is resolved    Plan:     She may begin radiation therapy when ready. Return in 4 weeks.

## 2011-08-25 NOTE — Patient Instructions (Signed)
Return 4 weeks

## 2011-08-26 ENCOUNTER — Ambulatory Visit: Payer: Federal, State, Local not specified - PPO

## 2011-08-26 ENCOUNTER — Other Ambulatory Visit: Payer: Self-pay | Admitting: Radiation Oncology

## 2011-08-26 ENCOUNTER — Ambulatory Visit: Payer: Federal, State, Local not specified - PPO | Admitting: Radiation Oncology

## 2011-09-04 ENCOUNTER — Ambulatory Visit
Admission: RE | Admit: 2011-09-04 | Discharge: 2011-09-04 | Disposition: A | Discharge status: Home / Self Care | Payer: Federal, State, Local not specified - PPO | Source: Ambulatory Visit | Attending: Radiation Oncology | Admitting: Radiation Oncology

## 2011-09-04 ENCOUNTER — Ambulatory Visit: Payer: Federal, State, Local not specified - PPO | Admitting: Oncology

## 2011-09-04 DIAGNOSIS — C50219 Malignant neoplasm of upper-inner quadrant of unspecified female breast: Secondary | ICD-10-CM

## 2011-09-04 NOTE — Addendum Note (Signed)
Encounter addended by: Lonie Peak, MD on: 09/04/2011  6:44 PM<BR>     Documentation filed: Notes Section

## 2011-09-04 NOTE — Progress Notes (Addendum)
Simulation / Treatment Planning Note  The patient has a diagnosis of    T1 CN 0 M0 ER positive HER-2/neu negative left breast cancer;         she is status post lumpectomy. She will receive whole breast radiotherapy. The patient was laid in the supine position on the treatment table with her arms over her head. Her head was in an Accuform device. I placed adhesive wiring over her lumpectomy scar and around the borders of her breast tissue . High-resolution CT axial imaging was obtained of the patient's chest. An isocenter was placed in her anterior left lung. Skin markings were made and she tolerated the procedure well without any complications.  The patient was then scanned in the CT simulator while holding her breath. I verified with our physicist that this significantly increased the distance between her heart and chest wall. We will treat her with the breath-hold technique and base her planning on the breath-hold images.   Treatment planning note: the patient will be treated with opposed tangential fields using MLCs for custom blocks. I plan to prescribe 50 Gray in 25 fractions to the whole breast. I have requested a DVH of the patient's right lung left lung heart and lumpectomy cavity. 3-D conformal radiotherapy will be used as an important modality to deliver adequate dose to her breast and surgical cavity while sparing her normal tissues.  I will re- simulate the patient for her lumpectomy boost, as she has a significant seroma at this time. Eventually she will receive 10 gray in 5 fractions to the lumpectomy cavity for her boost.

## 2011-09-07 ENCOUNTER — Telehealth: Payer: Self-pay | Admitting: Radiation Oncology

## 2011-09-07 NOTE — Telephone Encounter (Signed)
Sent pt RO billing sheet and breast handouts via mail.   Dx: 174.2 Upper-inner quadrant of breast   Attending Rad: Dr. Paulino Rily Tx: [id number] Extrl Beam

## 2011-09-14 ENCOUNTER — Ambulatory Visit
Admission: RE | Admit: 2011-09-14 | Discharge: 2011-09-14 | Disposition: A | Discharge status: Home / Self Care | Payer: Federal, State, Local not specified - PPO | Source: Ambulatory Visit | Attending: Radiation Oncology | Admitting: Radiation Oncology

## 2011-09-14 DIAGNOSIS — C50219 Malignant neoplasm of upper-inner quadrant of unspecified female breast: Secondary | ICD-10-CM

## 2011-09-14 NOTE — Progress Notes (Signed)
Simulation verification note: The patient underwent simulation verification for treatment to her left breast with deep inspiration/breath-hold. Her isocenter is in good position and the multileaf collimators contoured the treatment volume appropriately. 

## 2011-09-15 ENCOUNTER — Ambulatory Visit
Admission: RE | Admit: 2011-09-15 | Discharge: 2011-09-15 | Disposition: A | Discharge status: Home / Self Care | Payer: Federal, State, Local not specified - PPO | Source: Ambulatory Visit | Attending: Radiation Oncology | Admitting: Radiation Oncology

## 2011-09-16 ENCOUNTER — Ambulatory Visit
Admission: RE | Admit: 2011-09-16 | Discharge: 2011-09-16 | Disposition: A | Discharge status: Home / Self Care | Payer: Federal, State, Local not specified - PPO | Source: Ambulatory Visit | Attending: Radiation Oncology | Admitting: Radiation Oncology

## 2011-09-17 ENCOUNTER — Ambulatory Visit
Admission: RE | Admit: 2011-09-17 | Discharge: 2011-09-17 | Disposition: A | Discharge status: Home / Self Care | Payer: Federal, State, Local not specified - PPO | Source: Ambulatory Visit | Attending: Radiation Oncology | Admitting: Radiation Oncology

## 2011-09-18 ENCOUNTER — Ambulatory Visit
Admission: RE | Admit: 2011-09-18 | Discharge: 2011-09-18 | Disposition: A | Discharge status: Home / Self Care | Payer: Federal, State, Local not specified - PPO | Source: Ambulatory Visit | Attending: Radiation Oncology | Admitting: Radiation Oncology

## 2011-09-18 DIAGNOSIS — C50219 Malignant neoplasm of upper-inner quadrant of unspecified female breast: Secondary | ICD-10-CM

## 2011-09-18 MED ORDER — RADIAPLEXRX EX GEL
Freq: Once | CUTANEOUS | Status: AC
  Administered 2011-09-18: 17:00:00 via TOPICAL

## 2011-09-18 MED ORDER — ALRA NON-METALLIC DEODORANT (RAD-ONC)
1.0000 "application " | Freq: Once | TOPICAL | Status: AC
  Administered 2011-09-18: 1 via TOPICAL

## 2011-09-18 NOTE — Progress Notes (Signed)
Received patient in the clinic today for post sim education with Sam, RN. Patient is alert and oriented to person, place, and time. No distress noted. Steady gait noted. Flat affect noted. Patient denies pain at this time. Oriented patient to staff and routine of the clinic. Provided patient with RADIATION THERAPY AND YOU handbook then, reviewed pertinent information with the patient. Educated patient on potential side effects and management. Provided patient with Radiaplex and Alra then, educated upon use. All questions answered. Provided patient with [employer] business card and encouraged to contact staff with needs. Patient verbalized understanding of all things reviewed.    Patient expressed she lives alone with her 69 year old mother.

## 2011-09-21 ENCOUNTER — Ambulatory Visit
Admission: RE | Admit: 2011-09-21 | Discharge: 2011-09-21 | Disposition: A | Discharge status: Home / Self Care | Payer: Federal, State, Local not specified - PPO | Source: Ambulatory Visit | Attending: Radiation Oncology | Admitting: Radiation Oncology

## 2011-09-21 ENCOUNTER — Encounter: Payer: Self-pay | Admitting: Radiation Oncology

## 2011-09-21 VITALS — BP 128/83 | HR 79 | Temp 98.7°F | Resp 20 | Wt 222.9 lb

## 2011-09-21 DIAGNOSIS — C50219 Malignant neoplasm of upper-inner quadrant of unspecified female breast: Secondary | ICD-10-CM

## 2011-09-21 NOTE — Progress Notes (Signed)
   Weekly Management Note Current Dose:   10 Gy  Projected Dose:  60 Gy   Narrative:  The patient presents for routine under treatment assessment.  CBCT/MVCT images/Port film x-rays were reviewed.  The chart was checked. She is doing well. Minimal complaints. She feels that her left breast is now less red than it had been postoperatively. There is still some swelling underneath the lumpectomy scar.   Physical Findings:  weight is 222 lb 14.4 oz (101.107 kg). Her oral temperature is 98.7 F (37.1 C). Her blood pressure is 128/83 and her pulse is 79. Her respiration is 20.  her left breast is now less red than it had been postoperatively. There is still some significant swelling /seroma underneath the lumpectomy scar. The nipple is inverted.   Impression:  The patient is tolerating radiotherapy.  Plan:  Continue radiotherapy as planned.  ________________________________   Lonie Peak, M.D.

## 2011-09-21 NOTE — Progress Notes (Signed)
patient alert and oriented x3, left breast area slight pink tinged, breast tender, swollen,, where incision is looks indented says patient, using radiaplex gel, eating well and drinking diet sodas not enough water, 5/30 rad txs completed 2:10 PM

## 2011-09-22 ENCOUNTER — Ambulatory Visit
Admission: RE | Admit: 2011-09-22 | Discharge: 2011-09-22 | Disposition: A | Discharge status: Home / Self Care | Payer: Federal, State, Local not specified - PPO | Source: Ambulatory Visit | Attending: Radiation Oncology | Admitting: Radiation Oncology

## 2011-09-23 ENCOUNTER — Ambulatory Visit
Admission: RE | Admit: 2011-09-23 | Discharge: 2011-09-23 | Disposition: A | Discharge status: Home / Self Care | Payer: Federal, State, Local not specified - PPO | Source: Ambulatory Visit | Attending: Radiation Oncology | Admitting: Radiation Oncology

## 2011-09-24 ENCOUNTER — Encounter: Payer: Self-pay | Admitting: Radiation Oncology

## 2011-09-24 ENCOUNTER — Ambulatory Visit
Admission: RE | Admit: 2011-09-24 | Discharge: 2011-09-24 | Disposition: A | Discharge status: Home / Self Care | Payer: Federal, State, Local not specified - PPO | Source: Ambulatory Visit | Attending: Radiation Oncology | Admitting: Radiation Oncology

## 2011-09-24 DIAGNOSIS — R21 Rash and other nonspecific skin eruption: Secondary | ICD-10-CM

## 2011-09-24 DIAGNOSIS — C50219 Malignant neoplasm of upper-inner quadrant of unspecified female breast: Secondary | ICD-10-CM

## 2011-09-24 MED ORDER — AMOXICILLIN-POT CLAVULANATE 875-125 MG PO TABS: 1.0000 | ORAL_TABLET | Freq: Two times a day (BID) | ORAL | Status: AC

## 2011-09-24 NOTE — Progress Notes (Signed)
  Radiation Oncology         [phone] ________________________________  Name: Katelyn Walsh  MRN: [id number]  Date: 09/24/2011  DOB: 1950-06-30  Weekly Radiation Therapy Management  Current Dose: 14 Gy     Planned Dose:  60 Gy  Narrative . . . . . . . . The patient presents complaining of irritation, redness, and tenderness at her lumpectomy incision site.                                 The chart was checked. Physical Findings. . . Weight essentially stable.  She has significant erythema of the treated left breast which appears to be epicentered around a 1 cm length of superficial deshiscance of the incision with yellowish possibly purulent material.  The incision is intended and tender to touch.  She is afebrile Impression . . . . . . . The patient is  tolerating radiation.  She has a small opening in the lumpecomy incision superficially with redness, tenderness and warmth.  It looks like a superficial process that could progress to cellulitis within the radiation treatment field Plan . . . . . . . . . . . . Continue treatment as planned. Today, I advised her to apply neosporin topically twice daily, and added Augmentin 875 BID, every 7 days.  ________________________________  Artist Pais Kathrynn Running, M.D.

## 2011-09-25 ENCOUNTER — Ambulatory Visit
Admission: RE | Admit: 2011-09-25 | Discharge: 2011-09-25 | Disposition: A | Discharge status: Home / Self Care | Payer: Federal, State, Local not specified - PPO | Source: Ambulatory Visit | Attending: Radiation Oncology | Admitting: Radiation Oncology

## 2011-09-25 ENCOUNTER — Ambulatory Visit
Admission: RE | Admit: 2011-09-25 | Payer: Federal, State, Local not specified - PPO | Source: Ambulatory Visit | Admitting: Radiation Oncology

## 2011-09-25 NOTE — Progress Notes (Signed)
On yesterday [name] seen by Dr. Kathrynn Running for a new presentation of redness and swelling at her left breast incision, laterally, with a small amount of yellowish drainage noted on a small pad she had inserted in her bra.  No active drainage on examination on yesterday and she was afebrile at 98.0.  She was started on Augmentin by Dr. Kathrynn Running and has taken 2 doses thus far.  Today there is a mild    increased redness since yesterday but no change in the temperature of the area.  Dr. Kathrynn Running instructed her to call her surgeon over the weekend if she experiences more swelling, warmth, resumption of drainage and/or fever.  She stated understanding of the instructions.

## 2011-09-28 ENCOUNTER — Other Ambulatory Visit: Payer: Self-pay | Admitting: Radiation Oncology

## 2011-09-28 ENCOUNTER — Ambulatory Visit
Admission: RE | Admit: 2011-09-28 | Discharge: 2011-09-28 | Disposition: A | Discharge status: Home / Self Care | Payer: Federal, State, Local not specified - PPO | Source: Ambulatory Visit | Attending: Radiation Oncology | Admitting: Radiation Oncology

## 2011-09-28 VITALS — BP 127/80 | HR 96 | Temp 98.3°F | Wt 218.5 lb

## 2011-09-28 DIAGNOSIS — C50219 Malignant neoplasm of upper-inner quadrant of unspecified female breast: Secondary | ICD-10-CM

## 2011-09-28 DIAGNOSIS — G4700 Insomnia, unspecified: Secondary | ICD-10-CM

## 2011-09-28 MED ORDER — TEMAZEPAM 15 MG PO CAPS: 15.0000 mg | ORAL_CAPSULE | Freq: Every evening | ORAL | Status: DC | PRN

## 2011-09-28 NOTE — Progress Notes (Signed)
   Weekly Management Note Current Dose: 20 Gy  Projected Dose: 60 Gy   Narrative:  The patient presents for routine under treatment assessment.  CBCT/MVCT images/Port film x-rays were reviewed.  The chart was checked. She started Augmentin on 09/24/2011 by Dr. Kathrynn Running due to k drainage from her left breast incision. She demonstrated increased redness and swelling in that region. Since that time, My nurse appreciates improvement in the erythema in this region. The patient is not sure if there is been a change. The lumpectomy scar has closed up and scabbed over where prior drainage had taken place. Patient has a followup in 3 days with Dr. Luisa Hart. On that day she finishes her 7 day prescription for Augmentin  Physical Findings:  weight is 218 lb 8 oz (99.111 kg). Her temperature is 98.3 F (36.8 C). Her blood pressure is 127/80 and her pulse is 96.  left breast demonstrates erythema around the lumpectomy scar with mild  warmth. There is a small scab over the lumpectomy scar where prior yellow drainage had taken place. She still has a palpable seroma in this area.  Impression:  The patient is tolerating radiotherapy.  Plan:  Continue radiotherapy as planned. Continue Augmentin for a total of one week. I will defer to Dr. Luisa Hart as to whether he wants to continue the Augmentin with a new prescription or change this to a different antibiotic.  ________________________________   Lonie Peak, M.D.

## 2011-09-28 NOTE — Progress Notes (Signed)
10 Fractions to left breast. Continues on Augmentin started by Dr. Kathrynn Running on 09/24/11 for yellow drainage from her left breast incision.  Currently no drainage note, but Katelyn Walsh reports intermittent drainage on pad in her bra.  There is less redness in this area, but mild warmth is noted in this area.  Denies any pain.  Erythema noted, especially in the inner aspect of her left breast.  C/o difficulty sleeping and is requesting a sleep aid.

## 2011-09-29 ENCOUNTER — Ambulatory Visit
Admission: RE | Admit: 2011-09-29 | Discharge: 2011-09-29 | Disposition: A | Discharge status: Home / Self Care | Payer: Federal, State, Local not specified - PPO | Source: Ambulatory Visit | Attending: Radiation Oncology | Admitting: Radiation Oncology

## 2011-09-30 ENCOUNTER — Ambulatory Visit
Admission: RE | Admit: 2011-09-30 | Discharge: 2011-09-30 | Disposition: A | Discharge status: Home / Self Care | Payer: Federal, State, Local not specified - PPO | Source: Ambulatory Visit | Attending: Radiation Oncology | Admitting: Radiation Oncology

## 2011-10-01 ENCOUNTER — Ambulatory Visit
Admission: RE | Admit: 2011-10-01 | Discharge: 2011-10-01 | Disposition: A | Discharge status: Home / Self Care | Payer: Federal, State, Local not specified - PPO | Source: Ambulatory Visit | Attending: Radiation Oncology | Admitting: Radiation Oncology

## 2011-10-01 ENCOUNTER — Encounter (INDEPENDENT_AMBULATORY_CARE_PROVIDER_SITE_OTHER): Payer: Self-pay | Admitting: Surgery

## 2011-10-01 ENCOUNTER — Other Ambulatory Visit (INDEPENDENT_AMBULATORY_CARE_PROVIDER_SITE_OTHER): Payer: Self-pay | Admitting: Surgery

## 2011-10-01 ENCOUNTER — Ambulatory Visit (INDEPENDENT_AMBULATORY_CARE_PROVIDER_SITE_OTHER): Payer: Federal, State, Local not specified - PPO | Admitting: Surgery

## 2011-10-01 ENCOUNTER — Other Ambulatory Visit (INDEPENDENT_AMBULATORY_CARE_PROVIDER_SITE_OTHER): Payer: Self-pay

## 2011-10-01 VITALS — BP 128/68 | HR 70 | Temp 98.2°F | Resp 16 | Ht 66.0 in | Wt 222.2 lb

## 2011-10-01 DIAGNOSIS — Z9889 Other specified postprocedural states: Secondary | ICD-10-CM

## 2011-10-01 DIAGNOSIS — IMO0002 Reserved for concepts with insufficient information to code with codable children: Secondary | ICD-10-CM

## 2011-10-01 MED ORDER — DOXYCYCLINE HYCLATE 100 MG PO TABS: 100.0000 mg | ORAL_TABLET | Freq: Two times a day (BID) | ORAL | Status: AC

## 2011-10-01 NOTE — Patient Instructions (Signed)
Return 2 weeks    Finish antibiotics

## 2011-10-01 NOTE — Progress Notes (Signed)
Patient returns for wound check after left breast lumpectomy and sentinel lymph node mapping for breast cancer. She'll some redness and Dr. Basilio Cairo placed her on Augmentin. She is 3 weeks into treatment. The wound has been leaking from time to time.  Exam: Erythema of left breast noted within the radiation field. Small seroma palpated. I discussed aspiration for further evaluation which the patient agreed to. I prepped the skin with Betadine. 18 gauge needle was used and 10 cc of clear fluid was aspirated. There was no pus. This sent for culture.  Impression: Status post left breast lumpectomy for breast cancer undergoing radiation therapy with erythema  Plan: We'll change to doxycycline 100 mg by mouth twice a day for 10 more days. The fluid does not look infected. Return in 2 weeks for recheck or sooner if she has problems.

## 2011-10-02 ENCOUNTER — Ambulatory Visit
Admission: RE | Admit: 2011-10-02 | Discharge: 2011-10-02 | Disposition: A | Discharge status: Home / Self Care | Payer: Federal, State, Local not specified - PPO | Source: Ambulatory Visit | Attending: Radiation Oncology | Admitting: Radiation Oncology

## 2011-10-04 LAB — WOUND CULTURE
Gram Stain: NONE SEEN
Gram Stain: NONE SEEN
Gram Stain: NONE SEEN
Organism ID, Bacteria: NO GROWTH

## 2011-10-05 ENCOUNTER — Encounter: Payer: Self-pay | Admitting: Radiation Oncology

## 2011-10-05 ENCOUNTER — Ambulatory Visit
Admission: RE | Admit: 2011-10-05 | Discharge: 2011-10-05 | Disposition: A | Discharge status: Home / Self Care | Payer: Federal, State, Local not specified - PPO | Source: Ambulatory Visit | Attending: Radiation Oncology | Admitting: Radiation Oncology

## 2011-10-05 VITALS — Wt 237.7 lb

## 2011-10-05 DIAGNOSIS — C50219 Malignant neoplasm of upper-inner quadrant of unspecified female breast: Secondary | ICD-10-CM

## 2011-10-05 MED ORDER — BIAFINE EX EMUL
Freq: Two times a day (BID) | CUTANEOUS | Status: DC
  Administered 2011-10-05: 15:00:00 via TOPICAL

## 2011-10-05 MED ORDER — RADIAPLEXRX EX GEL: Freq: Once | CUTANEOUS | Status: DC

## 2011-10-05 NOTE — Progress Notes (Signed)
15/25 fractions to left breast.  Has rash-like area in the inner upper portion of left breast treatment field with itching to mid breast region.  Mild erythema.  Seen by her surgeon who switched her antibiotics to Doxycycline on 10/01/11.  Incision with out any drainage and Katelyn Walsh reports that her surgeon aspirated fluid for culture on 10/01/11.  Reports intermittent pain in her left breast and reports a decrease in energy.  She went to [place] this weekend and drove back this am.  Also note reddened area on her arms, upper back and back of neck and Katelyn Walsh is questioning if this "rash" is from the Doxycycline

## 2011-10-05 NOTE — Addendum Note (Signed)
Encounter addended by: Glennie Hawk, RN on: 10/05/2011  2:46 PM<BR>     Documentation filed: Inpatient MAR, Orders

## 2011-10-05 NOTE — Progress Notes (Signed)
   Weekly Management Note Current Dose:  30 Gy  Projected Dose: 60  Gy   Narrative:  The patient presents for routine under treatment assessment.  CBCT/MVCT images/Port film x-rays were reviewed.  The chart was checked. She saw Dr. Luisa Hart on 10/01/2011 and underwent aspiration of her seroma. Clear fluid was aspirated. This was sent for culture. The patient was switched doxycycline for 10 days. She went to [facility] over the weekend. Over the weekend she developed a rash over her arms and back. It is itchy. She reports that over the past 24 hours it is actually improved. The patient did take doxycycline earlier this summer with no problems.  Physical Findings:  weight is 237 lb 11.2 oz (107.82 kg).  faint erythematous rash over her arms. Breast is erythematous, skin is intact. Early radiation dermatitis in the upper inner quadrant. Palpable seroma has not progressed , it is somewhat smaller.  Impression:  The patient is tolerating radiotherapy.  Plan:  Continue radiotherapy as planned. Will give the patient Biafine to use in place of radiaplex. Also discussed 1% hydrocortisone cream to use over the area of radiation dermatitis. The patient will call Dr. Rosezena Sensor office if her rash over her arms gets any worse. Until then, she will continue doxycycline ________________________________   Lonie Peak, M.D.

## 2011-10-05 NOTE — Addendum Note (Signed)
Encounter addended by: Delynn Flavin, RN on: 10/05/2011  7:43 PM<BR>     Documentation filed: Orders

## 2011-10-06 ENCOUNTER — Ambulatory Visit
Admission: RE | Admit: 2011-10-06 | Discharge: 2011-10-06 | Disposition: A | Discharge status: Home / Self Care | Payer: Federal, State, Local not specified - PPO | Source: Ambulatory Visit | Attending: Radiation Oncology | Admitting: Radiation Oncology

## 2011-10-07 ENCOUNTER — Ambulatory Visit
Admission: RE | Admit: 2011-10-07 | Discharge: 2011-10-07 | Disposition: A | Discharge status: Home / Self Care | Payer: Federal, State, Local not specified - PPO | Source: Ambulatory Visit | Attending: Radiation Oncology | Admitting: Radiation Oncology

## 2011-10-08 ENCOUNTER — Ambulatory Visit
Admission: RE | Admit: 2011-10-08 | Discharge: 2011-10-08 | Disposition: A | Discharge status: Home / Self Care | Payer: Federal, State, Local not specified - PPO | Source: Ambulatory Visit | Attending: Radiation Oncology | Admitting: Radiation Oncology

## 2011-10-09 ENCOUNTER — Ambulatory Visit
Admission: RE | Admit: 2011-10-09 | Discharge: 2011-10-09 | Disposition: A | Discharge status: Home / Self Care | Payer: Federal, State, Local not specified - PPO | Source: Ambulatory Visit | Attending: Radiation Oncology | Admitting: Radiation Oncology

## 2011-10-12 ENCOUNTER — Ambulatory Visit
Admission: RE | Admit: 2011-10-12 | Discharge: 2011-10-12 | Disposition: A | Discharge status: Home / Self Care | Payer: Federal, State, Local not specified - PPO | Source: Ambulatory Visit | Attending: Radiation Oncology | Admitting: Radiation Oncology

## 2011-10-12 ENCOUNTER — Encounter: Payer: Self-pay | Admitting: Radiation Oncology

## 2011-10-12 VITALS — BP 123/75 | HR 93 | Temp 97.0°F | Resp 20 | Wt 217.6 lb

## 2011-10-12 DIAGNOSIS — C50219 Malignant neoplasm of upper-inner quadrant of unspecified female breast: Secondary | ICD-10-CM

## 2011-10-12 MED ORDER — BIAFINE EX EMUL
CUTANEOUS | Status: DC | PRN
  Administered 2011-10-12: 14:00:00 via TOPICAL

## 2011-10-12 MED ORDER — ALRA NON-METALLIC DEODORANT (RAD-ONC)
1.0000 "application " | Freq: Once | TOPICAL | Status: AC
  Administered 2011-10-12: 1 via TOPICAL

## 2011-10-12 NOTE — Progress Notes (Signed)
Patient alert,oriented x3, dermatitis on left breast area, c/o itching a lot, gave another biafine cream and alra deodorant per her request, eating and drinking well, completed 20/30 left breast  Rad txs Sometimes a throbbing in left breast , goes back to Dr.Cornett this wed, had 10cc  fluid drained 2 weeks ago by him,"No pus per MD", last doxecycline this am 2:00 PM

## 2011-10-12 NOTE — Progress Notes (Signed)
Weekly Management Note:  Site:L Breast Current Dose:  4000  cGy Projected Dose: 6000  cGy  Narrative: The patient is seen today for routine under treatment assessment. CBCT/MVCT images/port films were reviewed. The chart was reviewed.   She continues to have intermittent left breast discomfort. She apparently had cellulitis and this was treated with doxycycline which was finish this morning. She uses Biafine cream. She underwent repeat simulation earlier today for planning of her left breast boost.  Physical Examination:  Filed Vitals:   10/12/11 1357  BP: 123/75  Pulse: 93  Temp: 97 F (36.1 C)  Resp: 20  .  Weight: 217 lb 9.6 oz (98.703 kg). There is mild to moderate erythema the skin along left breast with no obvious infection.  Impression: Tolerating radiation therapy well.  Plan: Continue radiation therapy as planned.

## 2011-10-13 ENCOUNTER — Ambulatory Visit
Admission: RE | Admit: 2011-10-13 | Discharge: 2011-10-13 | Disposition: A | Discharge status: Home / Self Care | Payer: Federal, State, Local not specified - PPO | Source: Ambulatory Visit | Attending: Radiation Oncology | Admitting: Radiation Oncology

## 2011-10-14 ENCOUNTER — Ambulatory Visit
Admission: RE | Admit: 2011-10-14 | Discharge: 2011-10-14 | Disposition: A | Discharge status: Home / Self Care | Payer: Federal, State, Local not specified - PPO | Source: Ambulatory Visit | Attending: Radiation Oncology | Admitting: Radiation Oncology

## 2011-10-14 ENCOUNTER — Ambulatory Visit: Payer: Federal, State, Local not specified - PPO | Admitting: Oncology

## 2011-10-14 ENCOUNTER — Ambulatory Visit (INDEPENDENT_AMBULATORY_CARE_PROVIDER_SITE_OTHER): Payer: Federal, State, Local not specified - PPO | Admitting: Surgery

## 2011-10-14 ENCOUNTER — Encounter (INDEPENDENT_AMBULATORY_CARE_PROVIDER_SITE_OTHER): Payer: Self-pay | Admitting: Surgery

## 2011-10-14 ENCOUNTER — Encounter: Payer: Self-pay | Admitting: Radiation Oncology

## 2011-10-14 VITALS — BP 132/64 | HR 100 | Temp 97.2°F | Resp 20 | Ht 66.0 in | Wt 218.0 lb

## 2011-10-14 DIAGNOSIS — Z9889 Other specified postprocedural states: Secondary | ICD-10-CM

## 2011-10-14 NOTE — Patient Instructions (Signed)
Return 1 month

## 2011-10-14 NOTE — Progress Notes (Signed)
Patient returns for wound check after left breast lumpectomy and sentinel lymph node mapping for breast cancer.   She has had issues with drainage.  This was aspirated last visit and cultures were negative.  She has completed antibiotics.  She is sore.  Exam:  Minimal  Erythema of left breast noted within the radiation field. Small seroma palpated.   Impression: Status post left breast lumpectomy for breast cancer undergoing radiation therapy with improvement of erythema  Plan:Return 1 month.

## 2011-10-15 ENCOUNTER — Ambulatory Visit
Admission: RE | Admit: 2011-10-15 | Discharge: 2011-10-15 | Disposition: A | Discharge status: Home / Self Care | Payer: Federal, State, Local not specified - PPO | Source: Ambulatory Visit | Attending: Radiation Oncology | Admitting: Radiation Oncology

## 2011-10-16 ENCOUNTER — Ambulatory Visit
Admission: RE | Admit: 2011-10-16 | Discharge: 2011-10-16 | Disposition: A | Discharge status: Home / Self Care | Payer: Federal, State, Local not specified - PPO | Source: Ambulatory Visit | Attending: Radiation Oncology | Admitting: Radiation Oncology

## 2011-10-19 ENCOUNTER — Other Ambulatory Visit (HOSPITAL_BASED_OUTPATIENT_CLINIC_OR_DEPARTMENT_OTHER): Payer: Federal, State, Local not specified - PPO | Admitting: Lab

## 2011-10-19 ENCOUNTER — Ambulatory Visit
Admission: RE | Admit: 2011-10-19 | Discharge: 2011-10-19 | Disposition: A | Discharge status: Home / Self Care | Payer: Federal, State, Local not specified - PPO | Source: Ambulatory Visit | Attending: Radiation Oncology | Admitting: Radiation Oncology

## 2011-10-19 ENCOUNTER — Encounter: Payer: Self-pay | Admitting: Radiation Oncology

## 2011-10-19 ENCOUNTER — Ambulatory Visit (HOSPITAL_BASED_OUTPATIENT_CLINIC_OR_DEPARTMENT_OTHER): Payer: Federal, State, Local not specified - PPO | Admitting: Oncology

## 2011-10-19 ENCOUNTER — Encounter: Payer: Self-pay | Admitting: Oncology

## 2011-10-19 ENCOUNTER — Telehealth: Payer: Self-pay | Admitting: Oncology

## 2011-10-19 VITALS — BP 118/76 | HR 80 | Temp 98.1°F | Resp 20 | Ht 66.0 in | Wt 220.1 lb

## 2011-10-19 VITALS — BP 126/69 | HR 81 | Temp 97.6°F

## 2011-10-19 DIAGNOSIS — C50219 Malignant neoplasm of upper-inner quadrant of unspecified female breast: Secondary | ICD-10-CM

## 2011-10-19 DIAGNOSIS — Z170 Estrogen receptor positive status [ER+]: Secondary | ICD-10-CM

## 2011-10-19 LAB — COMPREHENSIVE METABOLIC PANEL (CC13)
ALT: 18 U/L (ref 0–55)
AST: 18 U/L (ref 5–34)
Albumin: 3.9 g/dL (ref 3.5–5.0)
Alkaline Phosphatase: 87 U/L (ref 40–150)
BUN: 19 mg/dL (ref 7.0–26.0)
CO2: 24 mEq/L (ref 22–29)
Calcium: 8.9 mg/dL (ref 8.4–10.4)
Chloride: 110 mEq/L — ABNORMAL HIGH (ref 98–107)
Creatinine: 1.1 mg/dL (ref 0.6–1.1)
Glucose: 127 mg/dl — ABNORMAL HIGH (ref 70–99)
Potassium: 4.1 mEq/L (ref 3.5–5.1)
Sodium: 142 mEq/L (ref 136–145)
Total Bilirubin: 0.6 mg/dL (ref 0.20–1.20)
Total Protein: 6.9 g/dL (ref 6.4–8.3)

## 2011-10-19 LAB — CBC WITH DIFFERENTIAL/PLATELET
BASO%: 1 % (ref 0.0–2.0)
Basophils Absolute: 0 10*3/uL (ref 0.0–0.1)
EOS%: 5 % (ref 0.0–7.0)
Eosinophils Absolute: 0.2 10*3/uL (ref 0.0–0.5)
HCT: 38.7 % (ref 34.8–46.6)
HGB: 13 g/dL (ref 11.6–15.9)
LYMPH%: 20.7 % (ref 14.0–49.7)
MCH: 30.8 pg (ref 25.1–34.0)
MCHC: 33.7 g/dL (ref 31.5–36.0)
MCV: 91.4 fL (ref 79.5–101.0)
MONO#: 0.3 10*3/uL (ref 0.1–0.9)
MONO%: 7.5 % (ref 0.0–14.0)
NEUT#: 3 10*3/uL (ref 1.5–6.5)
NEUT%: 65.8 % (ref 38.4–76.8)
Platelets: 189 10*3/uL (ref 145–400)
RBC: 4.23 10*6/uL (ref 3.70–5.45)
RDW: 13.6 % (ref 11.2–14.5)
WBC: 4.6 10*3/uL (ref 3.9–10.3)
lymph#: 1 10*3/uL (ref 0.9–3.3)

## 2011-10-19 MED ORDER — ANASTROZOLE 1 MG PO TABS: 1.0000 mg | ORAL_TABLET | Freq: Every day | ORAL | Status: AC

## 2011-10-19 MED ORDER — HYDROCODONE-ACETAMINOPHEN 5-500 MG PO CAPS: 1.0000 | ORAL_CAPSULE | Freq: Four times a day (QID) | ORAL | Status: AC | PRN

## 2011-10-19 MED ORDER — BIAFINE EX EMUL
CUTANEOUS | Status: DC | PRN
  Administered 2011-10-19: 1 via TOPICAL

## 2011-10-19 NOTE — Telephone Encounter (Signed)
gve the pt her nov 20`3 appt calendar along with the bone density appt at the [facility] in oct

## 2011-10-19 NOTE — Progress Notes (Signed)
Photon Boost Treatment Planning Note: 10-14-11  Diagnosis: Breast Cancer  The patient's CT images from her simulation were reviewed to plan her boost treatment to her left breast  lumpectomy cavity.  The boost to the lumpectomy cavity will be delivered with 3 photon fields using MLCs for custom blocks with 6 and 10 MV photon energy. 10 Gy in 5 fractions prescribed.

## 2011-10-19 NOTE — Progress Notes (Signed)
OFFICE PROGRESS NOTE  CC  [name], MD [address] [employer], [place]. [place] [address] Dr. Lonie Peak Dr. Harriette Bouillon  DIAGNOSIS: 61 year old female with new diagnosis of stage I invasive ductal carcinoma of the left breast diagnosed in May 2013.  PRIOR THERAPY:  #1 patient was originally seen in the multidisciplinary breast clinic after she had a ultrasound core needle biopsy performed that showed a invasive ductal carcinoma of the left breast at the 10:00 position. Since then she has gone on to have a lumpectomy with sentinel node biopsy. Her final pathology Showed a 1.8 cm invasive ductal carcinoma that was ER positive 100% PR Negative HER-2/neu negative grade 2 with KI 67 9%. Patient had 3 sentinel nodes that were negative for metastatic disease.  #2 patient had an Oncotype DX testing performed that showed the recurrence score Of 18. Giving her 11% risk of recurrence at 5 years with tamoxifen alone. This put her in the low-risk category. And does only antiestrogen therapy is being recommended after radiation.  #3 patient was seen by Dr. Doristine Devoid who is planning on doing radiation.  CURRENT THERAPY:To begin Arimidex 1 mg daily after completion of radiation therapy.  INTERVAL HISTORY: Katelyn Walsh 61 y.o. female returns for Followup.Overall she's doing well she is tolerating the radiation well without any significant complaints she denies any fevers chills night sweats headaches shortness of breath chest pains palpitations she does have some redness in the breast due to the radiation. Remainder of the 10 point review of systems is negative. MEDICAL HISTORY: Past Medical History  Diagnosis Date  . Back pain   . Migraines   . Seasonal allergies   . Hypertension   . Asthma     allergies  . Sleep apnea     has not used CPAP in two years  . Cancer     left breast  . Depression   . Rash and nonspecific skin eruption  08/13/2011  . Mastitis in female 08/13/2011    ALLERGIES:   has no known allergies.  MEDICATIONS:  Current Outpatient Prescriptions  Medication Sig Dispense Refill  . cetirizine (ZYRTEC) 10 MG tablet Take 10 mg by mouth daily.      [name] Kitchen doxepin (SINEQUAN) 50 MG capsule       . DULoxetine (CYMBALTA) 60 MG capsule Take 60 mg by mouth daily.      [name] Kitchen emollient (BIAFINE) cream Apply 1 application topically daily.      . hydrocodone-acetaminophen (LORCET-HD) 5-500 MG per capsule Take 1 capsule by mouth every 6 (six) hours as needed for pain.  30 capsule  1  . losartan-hydrochlorothiazide (HYZAAR) 100-25 MG per tablet Take 1 tablet by mouth daily.       [name] Kitchen MISC NATURAL PRODUCTS ER PO Take 1 capsule by mouth daily. Align probiotic       . montelukast (SINGULAIR) 10 MG tablet Take 10 mg by mouth at bedtime.      . Multiple Vitamins-Minerals (CENTRUM SILVER PO) Take by mouth daily.      . non-metallic deodorant Thornton Papas) MISC Apply 1 application topically daily as needed.      . REQUIP 1 MG tablet as needed.      . simvastatin (ZOCOR) 40 MG tablet Take 40 mg by mouth at bedtime.      . temazepam (RESTORIL) 15 MG capsule Take 1 capsule (15 mg total) by mouth at bedtime as needed for sleep.  30 capsule  0  .  topiramate (TOPAMAX) 100 MG tablet Take 100 mg by mouth 2 (two) times daily.      . Wound Cleansers (RADIAPLEX EX) Apply topically.       No current facility-administered medications for this visit.   Facility-Administered Medications Ordered in Other Visits  Medication Dose Route Frequency Provider Last Rate Last Dose  . topical emolient (BIAFINE) emulsion   Topical PRN Maryln Gottron, MD   1 application at 10/19/11 1339    SURGICAL HISTORY:  Past Surgical History  Procedure Date  . Tonsillectomy     as child  . Colonoscopy   . Mastectomy partial / lumpectomy w/ axillary lymphadenectomy 07/31/11    Left Breast : Invasive Ductal Carcinoma: 0/3  Nodes Negative: ER 100%, PR 0%, Her 2Neu Negative      REVIEW OF SYSTEMS:  Pertinent items are noted in HPI.   PHYSICAL EXAMINATION: General appearance: alert, cooperative and appears stated age Neck: no adenopathy, no carotid bruit, no JVD, supple, symmetrical, trachea midline and thyroid not enlarged, symmetric, no tenderness/mass/nodules Lymph nodes: Cervical, supraclavicular, and axillary nodes normal. Resp: clear to auscultation bilaterally and normal percussion bilaterally Back: symmetric, no curvature. ROM normal. No CVA tenderness. Cardio: regular rate and rhythm, S1, S2 normal, no murmur, click, rub or gallop GI: soft, non-tender; bowel sounds normal; no masses,  no organomegaly Extremities: extremities normal, atraumatic, no cyanosis or edema Neurologic: Grossly normal ECOG PERFORMANCE STATUS: 0 - Asymptomatic  Blood pressure 118/76, pulse 80, temperature 98.1 F (36.7 C), temperature source Oral, resp. rate 20, height 5\' 6"  (1.676 m), weight 220 lb 1.6 oz (99.837 kg).  LABORATORY DATA: Lab Results  Component Value Date   WBC 4.6 10/19/2011   HGB 13.0 10/19/2011   HCT 38.7 10/19/2011   MCV 91.4 10/19/2011   PLT 189 10/19/2011      Chemistry      Component Value Date/Time   NA 141 07/28/2011 1615   K 3.2* 07/28/2011 1615   CL 101 07/28/2011 1615   CO2 24 07/28/2011 1615   BUN 19 07/28/2011 1615   CREATININE 0.93 07/28/2011 1615      Component Value Date/Time   CALCIUM 9.5 07/28/2011 1615   ALKPHOS 94 07/28/2011 1615   AST 27 07/28/2011 1615   ALT 29 07/28/2011 1615   BILITOT 0.9 07/28/2011 1615     REASON FOR ADDENDUM, AMENDMENT OR CORRECTION: [id number]: Oncotype result 08/12/11 02:17:51 PM (gt) FINAL DIAGNOSIS Diagnosis 1. Lymph node, sentinel, biopsy, Left axilla - ONE LYMPH NODE, NEGATIVE FOR TUMOR (0/1) 2. Lymph node, sentinel, biopsy, Left axilla - ONE LYMPH NODE, NEGATIVE FOR TUMOR (0/1) 3. Lymph node, sentinel, biopsy, Left axilla - ONE LYMPH NODE, NEGATIVE FOR TUMOR (0/1) 4. Breast, lumpectomy, Left - INVASIVE  DUCTAL CARCINOMA, GRADE II (1.8 CM). - NO LYMPHOVASCULAR INVASION IDENTIFIED. - INVASIVE TUMOR IS 0.3 CM FROM NEAREST MARGIN (POSTERIOR). - DUCTAL CARCINOMA IN SITU. - SEE TUMOR SYNOPTIC TEMPLATE BELOW. Microscopic Comment 4. BREAST, INVASIVE TUMOR, WITH LYMPH NODE SAMPLING Specimen, including laterality: Left breast Procedure: Lumpectomy Grade: II of III Tubule formation: 3 Nuclear pleomorphism: 2 Mitotic:1 Tumor size (gross measurement): 1.8 cm Margins: Invasive, distance to closest margin: 0.3 cm In-situ, distance to closest margin: 0.3 cm (posterior) If margin positive, focally or broadly: N/A Lymphovascular invasion: Absent Ductal carcinoma in situ: Present Grade: II-III 1 of 3 Amended copy Addendum FINAL for Saltz, Kyndel A ([id number]) Microscopic Comment(continued) Extensive intraductal component: Absent Lobular neoplasia: Absent Tumor focality: Unifocal Treatment effect: None If present, treatment effect  in breast tissue, lymph nodes or both: N/A Extent of tumor: Skin: N/A Nipple: N/A Skeletal muscle: N/A Lymph nodes: # examined: 3 Lymph nodes with metastasis: 0 Breast prognostic profile: Estrogen receptor: Not repeated, previous study demonstrated 100% positivity ([id number]) Progesterone receptor: Not repeated, previous study demonstrated 0% positivity ([id number]) Her 2 neu: Repeated, previous study demonstrated no amplification ([id number]) Ki-67: Not repeated, previous study demonstrated 9% proliferation rate ([id number]) Non-neoplastic breast: Radial/complex sclerosis with calcifications, microcalcifications and benign ducts and lobules, and previous biopsy site TNM: pT1c, pN0, pMX (CR:mw 08-03-11) ADDENDUM: Per the oncotype DX report, the breast cancer recurrent score is 18. Patient's with a recurrent score of 18 had an average rate of distance recurrence of 11% (see oncotype report, [phone]) for the entire interpretation. (CRR:gt, 08/12/11) [place] RUND  DO Pathologist, Electronic Signature (Case signed 08/12/2011) Specimen Gross and Clinical  RADIOGRAPHIC STUDIES:  Chest 2 View  07/28/2011  *RADIOLOGY REPORT*  Clinical Data: Preop for breast surgery  CHEST - 2 VIEW  Comparison: None.  Findings: The lungs are clear.  Mediastinal contours appear normal. The heart is within normal limits in size.  No bony abnormality is seen.  IMPRESSION: No active lung disease.  Original Report Authenticated By: Juline Patch, M.D.   Nm Sentinel Node Inj-no Rpt (breast)  07/30/2011  CLINICAL DATA: left breast cancer   Sulfur colloid was injected intradermally by the nuclear medicine  technologist for breast cancer sentinel node localization.     Mm Breast Surgical Specimen  07/30/2011  *RADIOLOGY REPORT*  Clinical Data:  61 year old female with left breast cancer.  Wire localization prior to left lumpectomy.  NEEDLE LOCALIZATION WITH MAMMOGRAPHIC GUIDANCE AND SPECIMEN RADIOGRAPH  Patient presents for needle localization prior to left lumpectomy. I met with the patient and we discussed the procedure of needle localization including benefits and alternatives. We discussed the high likelihood of a successful procedure. We discussed the risks of the procedure, including infection, bleeding, tissue injury, and further surgery. Informed, written consent was given.  Using mammographic guidance, sterile technique, 2% lidocaine and a 7 cm modified Kopans needle, the biopsy clip/mass were localized using a medial approach.  The films are marked for Dr. Luisa Hart.  Specimen radiograph was performed at the [facility], and confirms the biopsy clip, wire and mass present in the tissue sample.  The specimen is marked for pathology.  IMPRESSION: Needle localization left breast.  No apparent complications.  Original Report Authenticated By: Rosendo Gros, M.D.   Mm Breast Wire Localization Left  07/30/2011  *RADIOLOGY REPORT*  Clinical Data:  61 year old female with  left breast cancer.  Wire localization prior to left lumpectomy.  NEEDLE LOCALIZATION WITH MAMMOGRAPHIC GUIDANCE AND SPECIMEN RADIOGRAPH  Patient presents for needle localization prior to left lumpectomy. I met with the patient and we discussed the procedure of needle localization including benefits and alternatives. We discussed the high likelihood of a successful procedure. We discussed the risks of the procedure, including infection, bleeding, tissue injury, and further surgery. Informed, written consent was given.  Using mammographic guidance, sterile technique, 2% lidocaine and a 7 cm modified Kopans needle, the biopsy clip/mass were localized using a medial approach.  The films are marked for Dr. Luisa Hart.  Specimen radiograph was performed at the [facility], and confirms the biopsy clip, wire and mass present in the tissue sample.  The specimen is marked for pathology.  IMPRESSION: Needle localization left breast.  No apparent complications.  Original Report Authenticated By: JEFFREY T.  HU, M.D.    ASSESSMENT: 61 year old female with  #1 stage IA invasive ductal carcinoma ER positive PR negative HER-2/neu negative with Ki-67 9%. Patient is now status post lumpectomy with sentinel node biopsy. The final pathology showed a 1.8 cm tumor 3 sentinel nodes were negative for metastatic disease. She had Oncotype DX testing performed that gave her a score of 18 with her 5 year recurrence at about 11% and in the low risk category.  #2 She will finish up radiation therapy and then begin Arimidex 1 mg daily. Risks and benefits of Arimidex were discussed with the patient. Literature was also given to her. We had an extensive discussion regarding the rationale.  PLAN:   #1 prescription for Arimidex was sent to patient's pharmacy of choice.  #2 she will return in 3 month's time in followup. She is very clearly instructed to begin the Arimidex after she completes the radiation.  All  questions were answered. The patient knows to call the clinic with any problems, questions or concerns. We can certainly see the patient much sooner if necessary.  I spent 30 minutes counseling the patient face to face. The total time spent in the appointment was 30 minutes.    Drue Second, MD Medical/Oncology [facility] [phone] (beeper) [phone] (Office)  10/19/2011, 2:30 PM

## 2011-10-19 NOTE — Progress Notes (Signed)
VERIFICATION SIMULATION NOTE  The patient was laid in the correct position on the treatment table for simulation verification. Portal imaging was obtained and I verified the fields and MLCs for her breast boost to be accurate. The patient tolerated the procedure well.  -----------------------------------------------------  Lonie Peak, MD

## 2011-10-19 NOTE — Progress Notes (Signed)
Weekly Management Note:  Site:L Breast Current Dose:  5000  cGy Projected Dose: 6000  cGy  Narrative: The patient is seen today for routine under treatment assessment. CBCT/MVCT images/port films were reviewed. The chart was reviewed.   He reports worsening pruritus along the breast and discomfort along the inframammary region. She has been using Biafine cream for the past week.  Physical Examination:  Filed Vitals:   10/19/11 1326  BP: 126/69  Pulse: 81  Temp: 97.6 F (36.4 C)  .  Weight:  . There is generalized moderate erythema along the left breast with impending focal moist desquamation along the inframammary region.  Impression: Tolerating radiation therapy well, however, she does have moderate radiation dermatitis. I told she may use hydrocortisone cream 1% to areas of pruritus. She is given a prescription for hydrocodone/APAP for left breast pain. She tells that her doctors want her to avoid NSAIDs. She was cautioned about constipation I suggested that she start a stool softener. She may need to start using antibiotic ointment/cream if she develops a moist desquamation along the inframammary region.  Plan: Continue radiation therapy as planned.

## 2011-10-19 NOTE — Progress Notes (Signed)
25/25 fractions to whole breast and will start boost field on tomorrow.  Bright erythema with reported itching in tx. Field.  Note start of desquamation in the inframmary fold.  Grades pain as a level 3 on a scale of 0-10.  Intermittent shooting pains getting more frequent with increased  Intensity.  Requesting pain med.

## 2011-10-19 NOTE — Patient Instructions (Addendum)
Finish up radiation therapy.  Start arimidex on 11/02/11  I will see you back in 3 months  Anastrozole tablets What is this medicine? ANASTROZOLE (an AS troe zole) is used to treat breast cancer in women who have gone through menopause. Some types of breast cancer depend on estrogen to grow, and this medicine can stop tumor growth by blocking estrogen production. This medicine may be used for other purposes; ask your health care provider or pharmacist if you have questions. What should I tell my health care provider before I take this medicine? They need to know if you have any of these conditions: -liver disease -an unusual or allergic reaction to anastrozole, other medicines, foods, dyes, or preservatives -pregnant or trying to get pregnant -breast-feeding How should I use this medicine? Take this medicine by mouth with a glass of water. Follow the directions on the prescription label. You can take this medicine with or without food. Take your doses at regular intervals. Do not take your medicine more often than directed. Do not stop taking except on the advice of your doctor or health care professional. Talk to your pediatrician regarding the use of this medicine in children. Special care may be needed. Overdosage: If you think you have taken too much of this medicine contact a poison control center or emergency room at once. NOTE: This medicine is only for you. Do not share this medicine with others. What if I miss a dose? If you miss a dose, take it as soon as you can. If it is almost time for your next dose, take only that dose. Do not take double or extra doses. What may interact with this medicine? Do not take this medicine with any of the following medications: -female hormones, like estrogens or progestins and birth control pills This medicine may also interact with the following medications: -tamoxifen This list may not describe all possible interactions. Give your health care  provider a list of all the medicines, herbs, non-prescription drugs, or dietary supplements you use. Also tell them if you smoke, drink alcohol, or use illegal drugs. Some items may interact with your medicine. What should I watch for while using this medicine? Visit your doctor or health care professional for regular checks on your progress. Let your doctor or health care professional know about any unusual vaginal bleeding. Do not treat yourself for diarrhea, nausea, vomiting or other side effects. Ask your doctor or health care professional for advice. What side effects may I notice from receiving this medicine? Side effects that you should report to your doctor or health care professional as soon as possible: -allergic reactions like skin rash, itching or hives, swelling of the face, lips, or tongue -any new or unusual symptoms -breathing problems -chest pain -leg pain or swelling -vomiting Side effects that usually do not require medical attention (report to your doctor or health care professional if they continue or are bothersome): -back or bone pain -cough, or throat infection -diarrhea or constipation -dizziness -headache -hot flashes -loss of appetite -nausea -sweating -weakness and tiredness -weight gain This list may not describe all possible side effects. Call your doctor for medical advice about side effects. You may report side effects to FDA at 1-800-FDA-1088. Where should I keep my medicine? Keep out of the reach of children. Store at room temperature between 20 and 25 degrees C (68 and 77 degrees F). Throw away any unused medicine after the expiration date. NOTE: This sheet is a summary. It may not cover  all possible information. If you have questions about this medicine, talk to your doctor, pharmacist, or health care provider.  2012, Elsevier/Gold Standard. (04/22/2007 4:31:52 PM)

## 2011-10-20 ENCOUNTER — Ambulatory Visit
Admission: RE | Admit: 2011-10-20 | Discharge: 2011-10-20 | Disposition: A | Discharge status: Home / Self Care | Payer: Federal, State, Local not specified - PPO | Source: Ambulatory Visit | Attending: Radiation Oncology | Admitting: Radiation Oncology

## 2011-10-21 ENCOUNTER — Ambulatory Visit
Admission: RE | Admit: 2011-10-21 | Discharge: 2011-10-21 | Disposition: A | Discharge status: Home / Self Care | Payer: Federal, State, Local not specified - PPO | Source: Ambulatory Visit | Attending: Radiation Oncology | Admitting: Radiation Oncology

## 2011-10-21 ENCOUNTER — Encounter: Payer: Self-pay | Admitting: Radiation Oncology

## 2011-10-21 VITALS — BP 119/70 | HR 75 | Temp 97.6°F | Resp 20 | Wt 222.5 lb

## 2011-10-21 DIAGNOSIS — C50219 Malignant neoplasm of upper-inner quadrant of unspecified female breast: Secondary | ICD-10-CM

## 2011-10-21 NOTE — Progress Notes (Signed)
   Weekly Management Note Current Dose:   54Gy  Projected Dose: 60 Gy   Narrative:  The patient presents for routine under treatment assessment.  CBCT/MVCT images/Port film x-rays were reviewed.  The chart was checked. Doing well. Has developed some desquamation of the left inframammary fold. It feels irritated.  Physical Findings:  weight is 222 lb 8 oz (100.925 kg). Her oral temperature is 97.6 F (36.4 C). Her blood pressure is 119/70 and her pulse is 75. Her respiration is 20.  left breast is diffusely erythematous with early moist desquamation at the mid inframammary fold.  Impression:  The patient is tolerating radiotherapy.  Plan:  Continue radiotherapy as planned. She will continue to apply Biafine to her breast; I recommended triple antibiotic ointment to the area of moist desquamation. She is also given hydrogel pads to apply to this area to  soothe that. A card was given for 1 month followup as well.  ________________________________   Lonie Peak, M.D.

## 2011-10-21 NOTE — Progress Notes (Signed)
Patient alert,oriented x3, left breast erythema and raw looking under fold of breast,c/o of that burning, hot feeling, occasional shooting pains thoughout left breasat, on boost 2/5 completed rad txs, patient requesting gel pads, per med onc nurse suggestion to ask 1:28 PM

## 2011-10-22 ENCOUNTER — Ambulatory Visit
Admission: RE | Admit: 2011-10-22 | Discharge: 2011-10-22 | Disposition: A | Discharge status: Home / Self Care | Payer: Federal, State, Local not specified - PPO | Source: Ambulatory Visit | Attending: Radiation Oncology | Admitting: Radiation Oncology

## 2011-10-23 ENCOUNTER — Ambulatory Visit: Payer: Federal, State, Local not specified - PPO

## 2011-10-27 ENCOUNTER — Ambulatory Visit
Admission: RE | Admit: 2011-10-27 | Discharge: 2011-10-27 | Disposition: A | Discharge status: Home / Self Care | Payer: Federal, State, Local not specified - PPO | Source: Ambulatory Visit | Attending: Radiation Oncology | Admitting: Radiation Oncology

## 2011-10-28 ENCOUNTER — Ambulatory Visit
Admission: RE | Admit: 2011-10-28 | Discharge: 2011-10-28 | Disposition: A | Discharge status: Home / Self Care | Payer: Federal, State, Local not specified - PPO | Source: Ambulatory Visit | Attending: Radiation Oncology | Admitting: Radiation Oncology

## 2011-10-28 ENCOUNTER — Encounter: Payer: Self-pay | Admitting: Radiation Oncology

## 2011-10-28 ENCOUNTER — Ambulatory Visit: Payer: Federal, State, Local not specified - PPO

## 2011-10-28 VITALS — BP 124/70 | HR 83 | Temp 98.0°F | Wt 218.0 lb

## 2011-10-28 DIAGNOSIS — C50219 Malignant neoplasm of upper-inner quadrant of unspecified female breast: Secondary | ICD-10-CM

## 2011-10-28 MED ORDER — BIAFINE EX EMUL
CUTANEOUS | Status: DC | PRN
  Administered 2011-10-28: 1 via TOPICAL

## 2011-10-28 NOTE — Progress Notes (Signed)
   Weekly Management Note Completed Radiotherapy. Total dose: 60Gy   Narrative:  The patient presents for routine under treatment assessment on last day of radiotherapy.  CBCT/MVCT images/Port film x-rays were reviewed.  The chart was checked. She is doing well. She reports itching over her left nipple nipple, lower inner quadrant. She is applying hydrogel pads and Neosporin to her inframammary fold. Using Biafine elsewhere.   Physical Findings:  weight is 218 lb (98.884 kg). Her temperature is 98 F (36.7 C). Her blood pressure is 124/70 and her pulse is 83.  no acute distress. Dry desquamation over the nipple and in the axilla. Diffuse hyperpigmentation and erythema through the breast. Resolving moist desquamation at the inframammary fold. palpable seroma underneath the lumpectomy scar  Impression:  The patient has tolerated radiotherapy.  Plan:  Routine follow-up in one month. Continue Neosporin at the inframammary fold. Continue Biafine elsewhere. The patient knows that she can use 1% hydrocortisone cream in the areas that are itching. Continue hydrogel pads. She knows to call if she has any issues before followup, ________________________________   Lonie Peak, M.D.

## 2011-10-28 NOTE — Progress Notes (Signed)
  Radiation Oncology         ([phone]) [phone] ________________________________  Name: Katelyn Walsh MRN: [id number]  Date: 10/28/2011  DOB: Jan 01, 1951  End of Treatment Note  Diagnosis:  left breast invasive ductal carcinoma, upper inner quadrant,  Clinical T1c N 0 M0, ER positive/PR negative HER-2 negative Ki-67 6% grade 2   Indication for treatment:  curative   Radiation treatment dates:   09/15/2011-10/28/2011  Site/dose:   1) Left breast / 50 Gy/25 fractions 2) Left breat boost / 10 Gy/5 fractions  Beams/energy:    1) Opposed tangents, breathhold / 6 MV photons 2) 3 fields / 6 and 10 MV photons  Narrative: The patient tolerated radiation treatment relatively well.     Plan: The patient has completed radiation treatment. The patient will return to radiation oncology clinic for routine followup in one month. I advised them to call or return sooner if they have any questions or concerns related to their recovery or treatment.  -----------------------------------  Lonie Peak, MD

## 2011-10-28 NOTE — Addendum Note (Signed)
Encounter addended by: Tessa Lerner, RN on: 10/28/2011  1:33 PM<BR>     Documentation filed: Inpatient MAR

## 2011-10-28 NOTE — Progress Notes (Signed)
Patient completes 30 of 30 left breast radiation  Treatments.Skin is mildly hyperpigmented but does have a quite a bit of itching.To continue application of biafine at least 2 to 3 times daily. Moderate fatigue but able to continue activities of daily living as takes care of 61 year old mother.One month follow scheduled.

## 2011-10-29 ENCOUNTER — Ambulatory Visit: Payer: Federal, State, Local not specified - PPO

## 2011-10-30 ENCOUNTER — Ambulatory Visit: Payer: Federal, State, Local not specified - PPO

## 2011-11-07 ENCOUNTER — Encounter (INDEPENDENT_AMBULATORY_CARE_PROVIDER_SITE_OTHER): Payer: Federal, State, Local not specified - PPO | Admitting: Surgery

## 2011-11-13 ENCOUNTER — Encounter (INDEPENDENT_AMBULATORY_CARE_PROVIDER_SITE_OTHER): Payer: Self-pay | Admitting: Surgery

## 2011-11-13 ENCOUNTER — Ambulatory Visit (INDEPENDENT_AMBULATORY_CARE_PROVIDER_SITE_OTHER): Payer: Federal, State, Local not specified - PPO | Admitting: Surgery

## 2011-11-13 DIAGNOSIS — Z9889 Other specified postprocedural states: Secondary | ICD-10-CM

## 2011-11-13 NOTE — Progress Notes (Signed)
Patient returns for wound check after left breast lumpectomy and sentinel lymph node mapping for breast cancer.   She has had issues with drainage.  She has completed antibiotics.  She is sore. She has completed radiation therapy and has sleep   And hot flashes issues Exam:  Minimal  Erythema of left breast noted within the radiation field.no  seroma palpated.   Impression: Status post left breast lumpectomy for breast cancer undergoing radiation therapy with improvement of erythema  Plan:Return 6 month. Oncology follow up

## 2011-11-13 NOTE — Patient Instructions (Signed)
Return in 6 months

## 2011-11-19 ENCOUNTER — Telehealth: Payer: Self-pay | Admitting: *Deleted

## 2011-11-26 ENCOUNTER — Encounter: Payer: Self-pay | Admitting: Radiation Oncology

## 2011-11-27 ENCOUNTER — Ambulatory Visit
Admission: RE | Admit: 2011-11-27 | Payer: Federal, State, Local not specified - PPO | Source: Ambulatory Visit | Admitting: Radiation Oncology

## 2011-11-27 HISTORY — DX: Personal history of irradiation: Z92.3

## 2011-11-27 HISTORY — DX: Flushing: R23.2

## 2011-11-30 ENCOUNTER — Other Ambulatory Visit: Payer: Federal, State, Local not specified - PPO

## 2011-12-04 ENCOUNTER — Ambulatory Visit
Admission: RE | Admit: 2011-12-04 | Discharge: 2011-12-04 | Disposition: A | Discharge status: Home / Self Care | Payer: Federal, State, Local not specified - PPO | Source: Ambulatory Visit | Attending: Radiation Oncology | Admitting: Radiation Oncology

## 2011-12-04 ENCOUNTER — Encounter: Payer: Self-pay | Admitting: Radiation Oncology

## 2011-12-04 VITALS — Resp 18 | Wt 217.9 lb

## 2011-12-04 DIAGNOSIS — C50219 Malignant neoplasm of upper-inner quadrant of unspecified female breast: Secondary | ICD-10-CM

## 2011-12-04 NOTE — Progress Notes (Signed)
Patient presents to the clinic today unaccompanied for follow up appointment with Dr. Basilio Cairo. Patient is alert and oriented to person, place, and time. No distress noted. Steady gait noted. Pleasant affect noted. Patient denies pain at this time. Patient reports that she has fluid collection behind her right ear and cold with dry cough. Patient reports being prescribed doxycycline and prednisone. Patient hopes she feel better in time for her trip to [place] the end of this month. Patient reports taking Arimidex as directed but, has noticed since she began taking it she hasn't been sleeping but one or two hours per night. Patient reports the site on her left breast where the lymph nodes were removed remains tender. Patient denies nipple discharge. Patient reports she feels like there is still fluid collection in her left breast. Hyperpigmentation of left breast without desquamation noted. Patient reports that she continues to use biafine cream on her left breast. Reported all findings to Dr. Basilio Cairo.

## 2011-12-04 NOTE — Progress Notes (Signed)
Radiation Oncology         [phone] ________________________________  Name: Katelyn Walsh MRN: [id number]  Date: 12/04/2011  DOB: 1950-05-06  Follow-Up Visit Note  CC: [name], MD  Victorino December, MD  Diagnosis: Clinical T1cN0 M0 ER positive/PR negative HER-2 negative left breast cancer  Interval Since Last Radiation: She completed radiotherapy on 10/28/2011  total dose of 60 Gray in 30 fractions  Narrative:  The patient returns today for routine follow-up.  Her main complaints today are a cold, fluid in her ear, and chronic insomnia for years. I did give her a prescription for temazepam as it seemed she had worsening sleep during radiotherapy and she is wondering if she may get this refilled.   She reports some soreness in her left breast and axilla. She is using  Biafine. She is taking Arimidex with hot flashes.                  ALLERGIES:   has no known allergies.  Meds: Current Outpatient Prescriptions  Medication Sig Dispense Refill  . anastrozole (ARIMIDEX) 1 MG tablet       . cetirizine (ZYRTEC) 10 MG tablet Take 10 mg by mouth daily.      [name] Kitchen doxepin (SINEQUAN) 50 MG capsule       . doxycycline (VIBRAMYCIN) 100 MG capsule Take 100 mg by mouth 2 (two) times daily.      . DULoxetine (CYMBALTA) 60 MG capsule Take 60 mg by mouth daily.      [name] Kitchen HYDROcodone-acetaminophen (VICODIN) 5-500 MG per tablet       . losartan-hydrochlorothiazide (HYZAAR) 100-25 MG per tablet Take 1 tablet by mouth daily.       [name] Kitchen MISC NATURAL PRODUCTS ER PO Take 1 capsule by mouth daily. Align probiotic       . montelukast (SINGULAIR) 10 MG tablet Take 10 mg by mouth at bedtime.      . Multiple Vitamins-Minerals (CENTRUM SILVER PO) Take by mouth daily.      . predniSONE (STERAPRED UNI-PAK) 5 MG TABS Take by mouth.      . REQUIP 1 MG tablet as needed.      . simvastatin (ZOCOR) 40 MG tablet Take 40 mg by mouth at bedtime.      . topiramate (TOPAMAX) 100 MG tablet Take 100 mg by mouth 2 (two)  times daily.      [name] Kitchen emollient (BIAFINE) cream Apply 1 application topically daily.      . non-metallic deodorant Thornton Papas) MISC Apply 1 application topically daily as needed.      . temazepam (RESTORIL) 15 MG capsule Take 1 capsule (15 mg total) by mouth at bedtime as needed for sleep.  30 capsule  0  . Wound Cleansers (RADIAPLEX EX) Apply topically.        Physical Findings: The patient is in no acute distress. Patient is alert and oriented.  weight is 217 lb 14.4 oz (98.839 kg). Her respiration is 18. .   Left breast is notable for mild residual erythema and hyperpigmentation. There is some dry desquamation that is starting to [facility] off of the nipple. The nipple is slightly inverted. Palpable fibrosis underneath the lumpectomy scar.  Lab Findings: Lab Results  Component Value Date   WBC 4.6 10/19/2011   HGB 13.0 10/19/2011   HCT 38.7 10/19/2011   MCV 91.4 10/19/2011   PLT 189 10/19/2011      Radiographic Findings: No results found.  Impression:  The patient is  recovering from the effects of radiation.   Plan:  I empathized with the patient about how difficult it is for her to have chronic insomnia but I told her that I am not comfortable continuing to prescribe the temazepam for her long term. I explained that this can be habit-forming and I think it would be more prudent for her to talk to her primary doctor about long-term ways to address her chronic insomnia. She has tried over-the-counter medications with no durable improvement. I suggested that she also look into holistic options such as meditation and acupuncture.   She is recovering well from radiotherapy and I am happy to see her back on an as-needed basis. I encouraged her  to call should any issues arise in the future. She knows that she'll need to continue mammography at least once a year. I recommended Vitamin E lotion over her breast for healing. __________________________   Lonie Peak, MD

## 2011-12-04 NOTE — Patient Instructions (Signed)
Continue with Mammography at least once a year.  Please call if you have any questions or concerns in the future. You may use Vitamin E lotion/oil/cream over your breast for healing.

## 2011-12-11 ENCOUNTER — Other Ambulatory Visit: Payer: Federal, State, Local not specified - PPO

## 2011-12-15 ENCOUNTER — Other Ambulatory Visit: Payer: Self-pay | Admitting: Otolaryngology

## 2011-12-15 DIAGNOSIS — H903 Sensorineural hearing loss, bilateral: Secondary | ICD-10-CM

## 2011-12-15 DIAGNOSIS — H905 Unspecified sensorineural hearing loss: Secondary | ICD-10-CM

## 2012-01-05 ENCOUNTER — Other Ambulatory Visit (HOSPITAL_BASED_OUTPATIENT_CLINIC_OR_DEPARTMENT_OTHER): Payer: Federal, State, Local not specified - PPO | Admitting: Lab

## 2012-01-05 ENCOUNTER — Telehealth: Payer: Self-pay | Admitting: *Deleted

## 2012-01-05 ENCOUNTER — Ambulatory Visit (HOSPITAL_COMMUNITY)
Admission: RE | Admit: 2012-01-05 | Discharge: 2012-01-05 | Disposition: A | Discharge status: Home / Self Care | Payer: Federal, State, Local not specified - PPO | Source: Ambulatory Visit | Attending: Oncology | Admitting: Oncology

## 2012-01-05 ENCOUNTER — Ambulatory Visit (HOSPITAL_BASED_OUTPATIENT_CLINIC_OR_DEPARTMENT_OTHER): Payer: Federal, State, Local not specified - PPO | Admitting: Oncology

## 2012-01-05 ENCOUNTER — Other Ambulatory Visit: Payer: Self-pay | Admitting: Oncology

## 2012-01-05 ENCOUNTER — Other Ambulatory Visit: Payer: Federal, State, Local not specified - PPO

## 2012-01-05 ENCOUNTER — Encounter: Payer: Self-pay | Admitting: Oncology

## 2012-01-05 VITALS — BP 96/61 | HR 111 | Temp 98.3°F | Resp 20 | Ht 66.0 in | Wt 221.9 lb

## 2012-01-05 DIAGNOSIS — C50219 Malignant neoplasm of upper-inner quadrant of unspecified female breast: Secondary | ICD-10-CM | POA: Insufficient documentation

## 2012-01-05 DIAGNOSIS — Z853 Personal history of malignant neoplasm of breast: Secondary | ICD-10-CM | POA: Insufficient documentation

## 2012-01-05 DIAGNOSIS — N61 Mastitis without abscess: Secondary | ICD-10-CM

## 2012-01-05 DIAGNOSIS — I10 Essential (primary) hypertension: Secondary | ICD-10-CM | POA: Insufficient documentation

## 2012-01-05 DIAGNOSIS — J189 Pneumonia, unspecified organism: Secondary | ICD-10-CM

## 2012-01-05 DIAGNOSIS — E876 Hypokalemia: Secondary | ICD-10-CM

## 2012-01-05 DIAGNOSIS — J069 Acute upper respiratory infection, unspecified: Secondary | ICD-10-CM

## 2012-01-05 DIAGNOSIS — R059 Cough, unspecified: Secondary | ICD-10-CM | POA: Insufficient documentation

## 2012-01-05 DIAGNOSIS — J45909 Unspecified asthma, uncomplicated: Secondary | ICD-10-CM | POA: Insufficient documentation

## 2012-01-05 DIAGNOSIS — R0989 Other specified symptoms and signs involving the circulatory and respiratory systems: Secondary | ICD-10-CM | POA: Insufficient documentation

## 2012-01-05 DIAGNOSIS — R05 Cough: Secondary | ICD-10-CM | POA: Insufficient documentation

## 2012-01-05 LAB — CBC WITH DIFFERENTIAL/PLATELET
BASO%: 0.6 % (ref 0.0–2.0)
Basophils Absolute: 0.1 10*3/uL (ref 0.0–0.1)
EOS%: 4.3 % (ref 0.0–7.0)
Eosinophils Absolute: 0.4 10*3/uL (ref 0.0–0.5)
HCT: 34.1 % — ABNORMAL LOW (ref 34.8–46.6)
HGB: 11.7 g/dL (ref 11.6–15.9)
LYMPH%: 14.5 % (ref 14.0–49.7)
MCH: 31.5 pg (ref 25.1–34.0)
MCHC: 34.4 g/dL (ref 31.5–36.0)
MCV: 91.3 fL (ref 79.5–101.0)
MONO#: 0.8 10*3/uL (ref 0.1–0.9)
MONO%: 8.4 % (ref 0.0–14.0)
NEUT#: 6.7 10*3/uL — ABNORMAL HIGH (ref 1.5–6.5)
NEUT%: 72.2 % (ref 38.4–76.8)
Platelets: 252 10*3/uL (ref 145–400)
RBC: 3.73 10*6/uL (ref 3.70–5.45)
RDW: 14.2 % (ref 11.2–14.5)
WBC: 9.3 10*3/uL (ref 3.9–10.3)
lymph#: 1.4 10*3/uL (ref 0.9–3.3)

## 2012-01-05 LAB — COMPREHENSIVE METABOLIC PANEL (CC13)
ALT: 18 U/L (ref 0–55)
AST: 15 U/L (ref 5–34)
Albumin: 3.7 g/dL (ref 3.5–5.0)
Alkaline Phosphatase: 102 U/L (ref 40–150)
BUN: 19 mg/dL (ref 7.0–26.0)
CO2: 25 mEq/L (ref 22–29)
Calcium: 9.3 mg/dL (ref 8.4–10.4)
Chloride: 103 mEq/L (ref 98–107)
Creatinine: 1.3 mg/dL — ABNORMAL HIGH (ref 0.6–1.1)
Glucose: 160 mg/dl — ABNORMAL HIGH (ref 70–99)
Potassium: 3 mEq/L — ABNORMAL LOW (ref 3.5–5.1)
Sodium: 137 mEq/L (ref 136–145)
Total Bilirubin: 0.75 mg/dL (ref 0.20–1.20)
Total Protein: 6.9 g/dL (ref 6.4–8.3)

## 2012-01-05 LAB — VITAMIN D 25 HYDROXY (VIT D DEFICIENCY, FRACTURES): Vit D, 25-Hydroxy: 34 ng/mL (ref 30–89)

## 2012-01-05 MED ORDER — AZITHROMYCIN 250 MG PO TABS: ORAL_TABLET | ORAL | Status: DC

## 2012-01-05 MED ORDER — FLUTICASONE PROPIONATE HFA 110 MCG/ACT IN AERO: 1.0000 | INHALATION_SPRAY | Freq: Two times a day (BID) | RESPIRATORY_TRACT | Status: DC

## 2012-01-05 MED ORDER — DOXYCYCLINE HYCLATE 100 MG PO TABS: 100.0000 mg | ORAL_TABLET | Freq: Two times a day (BID) | ORAL | Status: DC

## 2012-01-05 MED ORDER — POTASSIUM CHLORIDE CRYS ER 20 MEQ PO TBCR: 20.0000 meq | EXTENDED_RELEASE_TABLET | Freq: Two times a day (BID) | ORAL | Status: DC

## 2012-01-05 NOTE — Progress Notes (Signed)
OFFICE PROGRESS NOTE  CC  [name], MD [address], [address] [employer], [place]. [place] [address] Dr. Lonie Peak Dr. Harriette Bouillon  DIAGNOSIS: 61 year old female with new diagnosis of stage I invasive ductal carcinoma of the left breast diagnosed in May 2013.  PRIOR THERAPY:  #1 patient was originally seen in the multidisciplinary breast clinic after she had a ultrasound core needle biopsy performed that showed a invasive ductal carcinoma of the left breast at the 10:00 position. Since then she has gone on to have a lumpectomy with sentinel node biopsy. Her final pathology Showed a 1.8 cm invasive ductal carcinoma that was ER positive 100% PR Negative HER-2/neu negative grade 2 with KI 67 9%. Patient had 3 sentinel nodes that were negative for metastatic disease.  #2 patient had an Oncotype DX testing performed that showed the recurrence score Of 18. Giving her 11% risk of recurrence at 5 years with tamoxifen alone. This put her in the low-risk category. And does only antiestrogen therapy is being recommended after radiation.  #3 patient is status post radiation therapy.  #3 patient began Arimidex 1 mg daily adjuvantly.   CURRENT THERAPY:Arimidex 1 mg daily   INTERVAL HISTORY: Katelyn Walsh 61 y.o. female returns for Followup. Patient had called our office with complaints of left breast redness and tenderness. Because of this she was seen today urgently. She also is suffering from what looks like an upper respiratory tract infection and bronchitis/sinusitis. She tells me that a few months ago she went to [facility] prior to that she had the same symptoms and she was given antibiotics as well as steroids. And her symptoms resolved. However upon returning from [facility] her symptoms have come back. She recently was seen at her mothers PCP and indicated to the physician that her breast was red and tender. Her physician referred her to me.  MEDICAL  HISTORY: Past Medical History  Diagnosis Date  . Back pain   . Migraines   . Seasonal allergies   . Hypertension   . Asthma     allergies  . Sleep apnea     has not used CPAP in two years  . Cancer     left breast  . Depression   . Rash and nonspecific skin eruption 08/13/2011  . Mastitis in female 08/13/2011  . S/P radiation therapy 09/15/11 - 10/28/11    Left Breast  . Hot flashes     ALLERGIES:   has no known allergies.  MEDICATIONS:  Current Outpatient Prescriptions  Medication Sig Dispense Refill  . anastrozole (ARIMIDEX) 1 MG tablet       . cetirizine (ZYRTEC) 10 MG tablet Take 10 mg by mouth daily.      [name] Kitchen doxepin (SINEQUAN) 50 MG capsule       . doxycycline (VIBRAMYCIN) 100 MG capsule Take 100 mg by mouth 2 (two) times daily.      . DULoxetine (CYMBALTA) 60 MG capsule Take 60 mg by mouth daily.      [name] Kitchen emollient (BIAFINE) cream Apply 1 application topically daily.      [name] Kitchen HYDROcodone-acetaminophen (VICODIN) 5-500 MG per tablet       . losartan-hydrochlorothiazide (HYZAAR) 100-25 MG per tablet Take 1 tablet by mouth daily.       [name] Kitchen MISC NATURAL PRODUCTS ER PO Take 1 capsule by mouth daily. Align probiotic       . montelukast (SINGULAIR) 10 MG tablet Take 10 mg by mouth at bedtime.      [name] Kitchen  Multiple Vitamins-Minerals (CENTRUM SILVER PO) Take by mouth daily.      . non-metallic deodorant Thornton Papas) MISC Apply 1 application topically daily as needed.      . predniSONE (STERAPRED UNI-PAK) 5 MG TABS Take by mouth.      . REQUIP 1 MG tablet as needed.      . simvastatin (ZOCOR) 40 MG tablet Take 40 mg by mouth at bedtime.      . topiramate (TOPAMAX) 100 MG tablet Take 100 mg by mouth 2 (two) times daily.      . Wound Cleansers (RADIAPLEX EX) Apply topically.      . temazepam (RESTORIL) 15 MG capsule Take 1 capsule (15 mg total) by mouth at bedtime as needed for sleep.  30 capsule  0    SURGICAL HISTORY:  Past Surgical History  Procedure Date  . Tonsillectomy     as child  .  Colonoscopy   . Mastectomy partial / lumpectomy w/ axillary lymphadenectomy 07/31/11    Left Breast : Invasive Ductal Carcinoma: 0/3  Nodes Negative: ER 100%, PR 0%, Her 2Neu Negative    REVIEW OF SYSTEMS:  Pertinent items are noted in HPI.   PHYSICAL EXAMINATION: General appearance: alert, cooperative and appears stated age Neck: no adenopathy, no carotid bruit, no JVD, supple, symmetrical, trachea midline and thyroid not enlarged, symmetric, no tenderness/mass/nodules Lymph nodes: Cervical, supraclavicular, and axillary nodes normal. Resp: clear to auscultation bilaterally and normal percussion bilaterally Back: symmetric, no curvature. ROM normal. No CVA tenderness. Cardio: regular rate and rhythm, S1, S2 normal, no murmur, click, rub or gallop GI: soft, non-tender; bowel sounds normal; no masses,  no organomegaly Extremities: extremities normal, atraumatic, no cyanosis or edema Neurologic: Grossly normal Left breast is warm and erythematous and tender. No nipple discharge.  ECOG PERFORMANCE STATUS: 0 - Asymptomatic  Blood pressure 96/61, pulse 111, temperature 98.3 F (36.8 C), temperature source Oral, resp. rate 20, height 5\' 6"  (1.676 m), weight 221 lb 14.4 oz (100.653 kg).  LABORATORY DATA: Lab Results  Component Value Date   WBC 9.3 01/05/2012   HGB 11.7 01/05/2012   HCT 34.1* 01/05/2012   MCV 91.3 01/05/2012   PLT 252 01/05/2012      Chemistry      Component Value Date/Time   NA 142 10/19/2011 1356   NA 141 07/28/2011 1615   K 4.1 10/19/2011 1356   K 3.2* 07/28/2011 1615   CL 110* 10/19/2011 1356   CL 101 07/28/2011 1615   CO2 24 10/19/2011 1356   CO2 24 07/28/2011 1615   BUN 19.0 10/19/2011 1356   BUN 19 07/28/2011 1615   CREATININE 1.1 10/19/2011 1356   CREATININE 0.93 07/28/2011 1615      Component Value Date/Time   CALCIUM 8.9 10/19/2011 1356   CALCIUM 9.5 07/28/2011 1615   ALKPHOS 87 10/19/2011 1356   ALKPHOS 94 07/28/2011 1615   AST 18 10/19/2011 1356   AST 27 07/28/2011 1615    ALT 18 10/19/2011 1356   ALT 29 07/28/2011 1615   BILITOT 0.60 10/19/2011 1356   BILITOT 0.9 07/28/2011 1615     REASON FOR ADDENDUM, AMENDMENT OR CORRECTION: [id number]: Oncotype result 08/12/11 02:17:51 PM (gt) FINAL DIAGNOSIS Diagnosis 1. Lymph node, sentinel, biopsy, Left axilla - ONE LYMPH NODE, NEGATIVE FOR TUMOR (0/1) 2. Lymph node, sentinel, biopsy, Left axilla - ONE LYMPH NODE, NEGATIVE FOR TUMOR (0/1) 3. Lymph node, sentinel, biopsy, Left axilla - ONE LYMPH NODE, NEGATIVE FOR TUMOR (0/1) 4. Breast, lumpectomy, Left -  INVASIVE DUCTAL CARCINOMA, GRADE II (1.8 CM). - NO LYMPHOVASCULAR INVASION IDENTIFIED. - INVASIVE TUMOR IS 0.3 CM FROM NEAREST MARGIN (POSTERIOR). - DUCTAL CARCINOMA IN SITU. - SEE TUMOR SYNOPTIC TEMPLATE BELOW. Microscopic Comment 4. BREAST, INVASIVE TUMOR, WITH LYMPH NODE SAMPLING Specimen, including laterality: Left breast Procedure: Lumpectomy Grade: II of III Tubule formation: 3 Nuclear pleomorphism: 2 Mitotic:1 Tumor size (gross measurement): 1.8 cm Margins: Invasive, distance to closest margin: 0.3 cm In-situ, distance to closest margin: 0.3 cm (posterior) If margin positive, focally or broadly: N/A Lymphovascular invasion: Absent Ductal carcinoma in situ: Present Grade: II-III 1 of 3 Amended copy Addendum FINAL for Dunaway, Osha A ([id number]) Microscopic Comment(continued) Extensive intraductal component: Absent Lobular neoplasia: Absent Tumor focality: Unifocal Treatment effect: None If present, treatment effect in breast tissue, lymph nodes or both: N/A Extent of tumor: Skin: N/A Nipple: N/A Skeletal muscle: N/A Lymph nodes: # examined: 3 Lymph nodes with metastasis: 0 Breast prognostic profile: Estrogen receptor: Not repeated, previous study demonstrated 100% positivity ([id number]) Progesterone receptor: Not repeated, previous study demonstrated 0% positivity ([id number]) Her 2 neu: Repeated, previous study demonstrated  no amplification ([id number]) Ki-67: Not repeated, previous study demonstrated 9% proliferation rate ([id number]) Non-neoplastic breast: Radial/complex sclerosis with calcifications, microcalcifications and benign ducts and lobules, and previous biopsy site TNM: pT1c, pN0, pMX (CR:mw 08-03-11) ADDENDUM: Per the oncotype DX report, the breast cancer recurrent score is 18. Patient's with a recurrent score of 18 had an average rate of distance recurrence of 11% (see oncotype report, [phone]) for the entire interpretation. (CRR:gt, 08/12/11) [place] RUND DO Pathologist, Electronic Signature (Case signed 08/12/2011) Specimen Gross and Clinical  RADIOGRAPHIC STUDIES:  Chest 2 View  07/28/2011  *RADIOLOGY REPORT*  Clinical Data: Preop for breast surgery  CHEST - 2 VIEW  Comparison: None.  Findings: The lungs are clear.  Mediastinal contours appear normal. The heart is within normal limits in size.  No bony abnormality is seen.  IMPRESSION: No active lung disease.  Original Report Authenticated By: Juline Patch, M.D.   Nm Sentinel Node Inj-no Rpt (breast)  07/30/2011  CLINICAL DATA: left breast cancer   Sulfur colloid was injected intradermally by the nuclear medicine  technologist for breast cancer sentinel node localization.     Mm Breast Surgical Specimen  07/30/2011  *RADIOLOGY REPORT*  Clinical Data:  61 year old female with left breast cancer.  Wire localization prior to left lumpectomy.  NEEDLE LOCALIZATION WITH MAMMOGRAPHIC GUIDANCE AND SPECIMEN RADIOGRAPH  Patient presents for needle localization prior to left lumpectomy. I met with the patient and we discussed the procedure of needle localization including benefits and alternatives. We discussed the high likelihood of a successful procedure. We discussed the risks of the procedure, including infection, bleeding, tissue injury, and further surgery. Informed, written consent was given.  Using mammographic guidance, sterile technique, 2% lidocaine and  a 7 cm modified Kopans needle, the biopsy clip/mass were localized using a medial approach.  The films are marked for Dr. Luisa Hart.  Specimen radiograph was performed at the [facility], and confirms the biopsy clip, wire and mass present in the tissue sample.  The specimen is marked for pathology.  IMPRESSION: Needle localization left breast.  No apparent complications.  Original Report Authenticated By: Rosendo Gros, M.D.   Mm Breast Wire Localization Left  07/30/2011  *RADIOLOGY REPORT*  Clinical Data:  61 year old female with left breast cancer.  Wire localization prior to left lumpectomy.  NEEDLE LOCALIZATION WITH MAMMOGRAPHIC GUIDANCE AND SPECIMEN RADIOGRAPH  Patient presents for needle  localization prior to left lumpectomy. I met with the patient and we discussed the procedure of needle localization including benefits and alternatives. We discussed the high likelihood of a successful procedure. We discussed the risks of the procedure, including infection, bleeding, tissue injury, and further surgery. Informed, written consent was given.  Using mammographic guidance, sterile technique, 2% lidocaine and a 7 cm modified Kopans needle, the biopsy clip/mass were localized using a medial approach.  The films are marked for Dr. Luisa Hart.  Specimen radiograph was performed at the [facility], and confirms the biopsy clip, wire and mass present in the tissue sample.  The specimen is marked for pathology.  IMPRESSION: Needle localization left breast.  No apparent complications.  Original Report Authenticated By: Rosendo Gros, M.D.    ASSESSMENT: 61 year old female with  #1 stage IA invasive ductal carcinoma ER positive PR negative HER-2/neu negative with Ki-67 9%. Patient is now status post lumpectomy with sentinel node biopsy. The final pathology showed a 1.8 cm tumor 3 sentinel nodes were negative for metastatic disease. She had Oncotype DX testing performed that gave  her a score of 18 with her 5 year recurrence at about 11% and in the low risk category.  #2 She will finish up radiation therapy and then begin Arimidex 1 mg daily. Risks and benefits of Arimidex were discussed with the patient. Literature was also given to her. We had an extensive discussion regarding the rationale.  #3 possible breast mastitis  #4 upper respiratory tract infection/bronchitis  PLAN:  #1 possible mastitis of the breast patient will begin oral antibiotic with doxycycline twice a day.  #2 upper respiratory tract infection/bronchitis I will begin her on azithromycin with one Z-Pak.  #3 we will get a chest x-ray to make sure patient does not have a pneumonia. She does have wheezing and I have recommended using a inhaler such as Flovent and a prescription was given to her.  #4 patient will be seen back at her scheduled appointment in December or sooner if need arises  All questions were answered. The patient knows to call the clinic with any problems, questions or concerns. We can certainly see the patient much sooner if necessary.  I spent 30 minutes counseling the patient face to face. The total time spent in the appointment was 30 minutes.    Drue Second, MD Medical/Oncology [facility] [phone] (beeper) [phone] (Office)  01/05/2012, 10:47 AM

## 2012-01-05 NOTE — Telephone Encounter (Signed)
gve the pt her referral for the chest x-ray for today at [facility]

## 2012-01-05 NOTE — Telephone Encounter (Signed)
Pt LM with scheduler regarding her medications. " which ones do I take?" After review with MD, Returned pt;s call per MD pt is to take the antibiotics, Kdur, and inhaler that was prescribed at today's visit with Dr. Welton Flakes. Do not take prednisone at this time. Pt verbalized understanding. No further questions

## 2012-01-05 NOTE — Patient Instructions (Addendum)
Left breast mastitis: doxycycline for infection  Lungs: chest x-ray today, azithromycin , flovent inhaler  Low Potassium: take K-dur  Keep your previous appointment

## 2012-01-06 ENCOUNTER — Telehealth (INDEPENDENT_AMBULATORY_CARE_PROVIDER_SITE_OTHER): Payer: Self-pay | Admitting: General Surgery

## 2012-01-06 NOTE — Telephone Encounter (Signed)
Pt called after visit with Dr. Welton Flakes.  She is pt of Dr. Luisa Hart from June for lumpectomy.  She saw Dr. Welton Flakes for breast hot and swollen for last two days; diagnosed as mastitis.  She was started on two antibiotics (Azithromycin and Doxycyline.)  Not sure if she should see Dr. Luisa Hart or not.  Reassured the antibiotics should take care of the infection, but if by Monday there was not significant improvement, to call Dr. Welton Flakes back for guidance.  She understands and will comply.

## 2012-01-07 ENCOUNTER — Telehealth: Payer: Self-pay | Admitting: *Deleted

## 2012-01-07 NOTE — Telephone Encounter (Signed)
Per MD, Notified pt to continue to take antibiotics as prescribed. Keep follow up with MD as scheduled. Pt verbalized understanding.

## 2012-01-07 NOTE — Telephone Encounter (Signed)
Please tell her to continue taking the antibiotics as prescribed. Keep her follow up with me

## 2012-01-07 NOTE — Telephone Encounter (Signed)
Pt called states " after I saw Dr. Welton Flakes I called Dr. Marylee Floras office and they weren't inclined to make an appt for me to see Dr. Luisa Hart." Pt unsure what to do. If she needs a f/u with Dr. Luisa Hart or not. Discussed with pt I will review her concerns with MD and call her back with further instructions.

## 2012-01-11 ENCOUNTER — Telehealth (INDEPENDENT_AMBULATORY_CARE_PROVIDER_SITE_OTHER): Payer: Self-pay | Admitting: General Surgery

## 2012-01-11 NOTE — Telephone Encounter (Signed)
Katelyn Walsh called to report that Dr. Welton Flakes started her on antibiotics last week and her breast redness has not improved. Her breast feels moe firm. I instructed her to call Dr. Milta Deiters office/nurse and report this to her for recommendation/ I also scheduled her to see Dr. Luisa Hart tomorrow/ gy

## 2012-01-12 ENCOUNTER — Encounter (INDEPENDENT_AMBULATORY_CARE_PROVIDER_SITE_OTHER): Payer: Self-pay | Admitting: Surgery

## 2012-01-12 ENCOUNTER — Ambulatory Visit (INDEPENDENT_AMBULATORY_CARE_PROVIDER_SITE_OTHER): Payer: Federal, State, Local not specified - PPO | Admitting: Surgery

## 2012-01-12 ENCOUNTER — Ambulatory Visit: Payer: Federal, State, Local not specified - PPO

## 2012-01-12 VITALS — BP 118/78 | HR 78 | Temp 97.2°F | Resp 18 | Ht 65.0 in | Wt 212.0 lb

## 2012-01-12 DIAGNOSIS — N611 Abscess of the breast and nipple: Secondary | ICD-10-CM | POA: Insufficient documentation

## 2012-01-12 DIAGNOSIS — N61 Mastitis without abscess: Secondary | ICD-10-CM

## 2012-01-12 HISTORY — DX: Abscess of the breast and nipple: N61.1

## 2012-01-12 MED ORDER — HYDROCODONE-ACETAMINOPHEN 5-325 MG PO TABS: 1.0000 | ORAL_TABLET | Freq: Four times a day (QID) | ORAL | Status: DC | PRN

## 2012-01-12 NOTE — Progress Notes (Signed)
Subjective:     Patient ID: Katelyn Walsh, female   DOB: [birth year], 61 y.o.   MRN: [id number]  HPI patient returns due to redness and swelling of her left breast for 1 week. Earlier this year she had a left breast lumpectomy with postoperative radiation therapy for breast cancer. She has been on antibiotics with no resolution of her symptoms. The breast is red, hot and very tender to touch.   Review of Systems  Constitutional: Positive for fever and chills.  HENT: Negative.   Respiratory: Negative.        Objective:   Physical Exam  Constitutional: She appears well-developed and well-nourished.  HENT:  Head: Normocephalic and atraumatic.  Eyes: EOM are normal. Pupils are equal, round, and reactive to light.  Neck: Normal range of motion. Neck supple.  Pulmonary/Chest: Left breast exhibits mass and tenderness. Breasts are asymmetrical.         Assessment:     Left breast abscess    Plan:     Recommend incision and drainage in the office.  She understands and agrees.under sterile conditions left breast was prepped. 1% lidocaine was used. This was infiltrated in the skin. A #11 blade used to puncture the abscess cavity with copious amounts of pus drained. Cultures taken. Quarter-inch packing put in the wound. She will remove this in 2 days. She will finish her antibiotics which is doxycycline. Return to clinic next Monday for recheck. Patient tolerated procedure well with no complications.

## 2012-01-12 NOTE — Patient Instructions (Signed)
Remove packing on Thursday in shower and cover daily with maxipad,  Finish Antibiotics.  Return Monday.

## 2012-01-14 ENCOUNTER — Ambulatory Visit
Admission: RE | Admit: 2012-01-14 | Discharge: 2012-01-14 | Disposition: A | Discharge status: Home / Self Care | Payer: Federal, State, Local not specified - PPO | Source: Ambulatory Visit | Attending: Oncology | Admitting: Oncology

## 2012-01-14 ENCOUNTER — Ambulatory Visit (INDEPENDENT_AMBULATORY_CARE_PROVIDER_SITE_OTHER): Payer: Federal, State, Local not specified - PPO

## 2012-01-14 DIAGNOSIS — Z4800 Encounter for change or removal of nonsurgical wound dressing: Secondary | ICD-10-CM

## 2012-01-14 DIAGNOSIS — IMO0001 Reserved for inherently not codable concepts without codable children: Secondary | ICD-10-CM

## 2012-01-14 DIAGNOSIS — C50219 Malignant neoplasm of upper-inner quadrant of unspecified female breast: Secondary | ICD-10-CM

## 2012-01-14 NOTE — Progress Notes (Signed)
Patient walked in office to have wound looked at and ask questions regarding dressing changes. Wound looks good, minimal drainage. No redness. She asked if she should be putting any ointment on area. I told her to only put dry guaze or bandage over area, no ointments. Patient understood instructions and will follow up with Dr. Luisa Hart on Monday.

## 2012-01-15 ENCOUNTER — Encounter: Payer: Self-pay | Admitting: *Deleted

## 2012-01-15 DIAGNOSIS — C50219 Malignant neoplasm of upper-inner quadrant of unspecified female breast: Secondary | ICD-10-CM

## 2012-01-15 LAB — WOUND CULTURE
Gram Stain: NONE SEEN
Gram Stain: NONE SEEN
Organism ID, Bacteria: NO GROWTH

## 2012-01-18 ENCOUNTER — Other Ambulatory Visit (HOSPITAL_BASED_OUTPATIENT_CLINIC_OR_DEPARTMENT_OTHER): Payer: Federal, State, Local not specified - PPO | Admitting: Lab

## 2012-01-18 ENCOUNTER — Ambulatory Visit (INDEPENDENT_AMBULATORY_CARE_PROVIDER_SITE_OTHER): Payer: Federal, State, Local not specified - PPO | Admitting: Surgery

## 2012-01-18 ENCOUNTER — Encounter: Payer: Self-pay | Admitting: Oncology

## 2012-01-18 ENCOUNTER — Telehealth: Payer: Self-pay | Admitting: *Deleted

## 2012-01-18 ENCOUNTER — Ambulatory Visit (HOSPITAL_BASED_OUTPATIENT_CLINIC_OR_DEPARTMENT_OTHER): Payer: Federal, State, Local not specified - PPO | Admitting: Oncology

## 2012-01-18 ENCOUNTER — Encounter (INDEPENDENT_AMBULATORY_CARE_PROVIDER_SITE_OTHER): Payer: Self-pay | Admitting: Surgery

## 2012-01-18 VITALS — BP 134/68 | HR 104 | Temp 97.3°F | Resp 20 | Ht 65.0 in | Wt 212.8 lb

## 2012-01-18 VITALS — BP 95/63 | HR 116 | Temp 98.7°F | Resp 20 | Ht 65.0 in | Wt 216.6 lb

## 2012-01-18 DIAGNOSIS — Z170 Estrogen receptor positive status [ER+]: Secondary | ICD-10-CM

## 2012-01-18 DIAGNOSIS — C50219 Malignant neoplasm of upper-inner quadrant of unspecified female breast: Secondary | ICD-10-CM

## 2012-01-18 DIAGNOSIS — Z9889 Other specified postprocedural states: Secondary | ICD-10-CM

## 2012-01-18 LAB — COMPREHENSIVE METABOLIC PANEL (CC13)
ALT: 19 U/L (ref 0–55)
AST: 19 U/L (ref 5–34)
Albumin: 3.8 g/dL (ref 3.5–5.0)
Alkaline Phosphatase: 107 U/L (ref 40–150)
BUN: 18 mg/dL (ref 7.0–26.0)
CO2: 24 mEq/L (ref 22–29)
Calcium: 9.3 mg/dL (ref 8.4–10.4)
Chloride: 107 mEq/L (ref 98–107)
Creatinine: 1.1 mg/dL (ref 0.6–1.1)
Glucose: 126 mg/dl — ABNORMAL HIGH (ref 70–99)
Potassium: 4.2 mEq/L (ref 3.5–5.1)
Sodium: 140 mEq/L (ref 136–145)
Total Bilirubin: 0.62 mg/dL (ref 0.20–1.20)
Total Protein: 7.2 g/dL (ref 6.4–8.3)

## 2012-01-18 LAB — CBC WITH DIFFERENTIAL/PLATELET
BASO%: 0.6 % (ref 0.0–2.0)
Basophils Absolute: 0 10*3/uL (ref 0.0–0.1)
EOS%: 2.4 % (ref 0.0–7.0)
Eosinophils Absolute: 0.2 10*3/uL (ref 0.0–0.5)
HCT: 40 % (ref 34.8–46.6)
HGB: 13.3 g/dL (ref 11.6–15.9)
LYMPH%: 17 % (ref 14.0–49.7)
MCH: 30.6 pg (ref 25.1–34.0)
MCHC: 33.3 g/dL (ref 31.5–36.0)
MCV: 92 fL (ref 79.5–101.0)
MONO#: 0.4 10*3/uL (ref 0.1–0.9)
MONO%: 5.3 % (ref 0.0–14.0)
NEUT#: 5.5 10*3/uL (ref 1.5–6.5)
NEUT%: 74.7 % (ref 38.4–76.8)
Platelets: 297 10*3/uL (ref 145–400)
RBC: 4.34 10*6/uL (ref 3.70–5.45)
RDW: 13.8 % (ref 11.2–14.5)
WBC: 7.3 10*3/uL (ref 3.9–10.3)
lymph#: 1.2 10*3/uL (ref 0.9–3.3)

## 2012-01-18 NOTE — Telephone Encounter (Signed)
Gave patient appointment 07-08-2012 starting at 1:15pm

## 2012-01-18 NOTE — Patient Instructions (Addendum)
Continue arimidex  We will see you back in 6 months 

## 2012-01-18 NOTE — Patient Instructions (Addendum)
Return next week.

## 2012-01-18 NOTE — Progress Notes (Signed)
Patient returns after incision and drainage of left breast abscess. Culture shows no growth. I took a Q-tip and probed the wound today. There still a small amount of scant fluid and I repacked it for today. The redness induration or better than last week. She will finish her antibiotics this week. However remove her packing later on this week and return next week for recheck. Overall, she seems to be doing much better.

## 2012-01-18 NOTE — Progress Notes (Signed)
OFFICE PROGRESS NOTE  CC  Katelyn Mountain, MD [address], [address] [employer], [place]. [place] [address] Dr. Lonie Peak Dr. Harriette Bouillon  DIAGNOSIS: 61 year old female with new diagnosis of stage I invasive ductal carcinoma of the left breast diagnosed in May 2013.  PRIOR THERAPY:  #1 patient was originally seen in the multidisciplinary breast clinic after she had a ultrasound core needle biopsy performed that showed a invasive ductal carcinoma of the left breast at the 10:00 position. Since then she has gone on to have a lumpectomy with sentinel node biopsy. Her final pathology Showed a 1.8 cm invasive ductal carcinoma that was ER positive 100% PR Negative HER-2/neu negative grade 2 with KI 67 9%. Patient had 3 sentinel nodes that were negative for metastatic disease.  #2 patient had an Oncotype DX testing performed that showed the recurrence score Of 18. Giving her 11% risk of recurrence at 5 years with tamoxifen alone. This put her in the low-risk category. And does only antiestrogen therapy is being recommended after radiation.  #3 patient is status post radiation therapy.  #3 patient began Arimidex 1 mg daily adjuvantly.   CURRENT THERAPY: Arimidex 1 mg daily   INTERVAL HISTORY: Katelyn Walsh 61 y.o. female returns for Followup. Today she looks much better. She was seen by Dr. Harriette Bouillon and she in fact did have a abscess of the left breast. She has had incision and drainage. Today she has a week. Her bronchitis has resolved. She has not had any fevers chills night sweats headaches no shortness of breath no chest pains no palpitations no myalgias and arthralgias remainder of the 10 point review of systems is negative.  MEDICAL HISTORY: Past Medical History  Diagnosis Date  . Back pain   . Migraines   . Seasonal allergies   . Hypertension   . Asthma     allergies  . Sleep apnea     has not used CPAP in two years  . Cancer     left  breast  . Depression   . Rash and nonspecific skin eruption 08/13/2011  . Mastitis in female 08/13/2011  . S/P radiation therapy 09/15/11 - 10/28/11    Left Breast  . Hot flashes     ALLERGIES:   has no known allergies.  MEDICATIONS:  Current Outpatient Prescriptions  Medication Sig Dispense Refill  . anastrozole (ARIMIDEX) 1 MG tablet       . azithromycin (ZITHROMAX Z-PAK) 250 MG tablet Take 2 today then 1 every day until completed  6 each  0  . cetirizine (ZYRTEC) 10 MG tablet Take 10 mg by mouth daily.      [name] Kitchen doxepin (SINEQUAN) 50 MG capsule       . doxycycline (VIBRA-TABS) 100 MG tablet Take 1 tablet (100 mg total) by mouth 2 (two) times daily.  28 tablet  0  . doxycycline (VIBRAMYCIN) 100 MG capsule Take 100 mg by mouth 2 (two) times daily.      . DULoxetine (CYMBALTA) 60 MG capsule Take 60 mg by mouth daily.      [name] Kitchen emollient (BIAFINE) cream Apply 1 application topically daily.      . fluticasone (FLOVENT HFA) 110 MCG/ACT inhaler Inhale 1 puff into the lungs 2 (two) times daily.  1 Inhaler  12  . HYDROcodone-acetaminophen (NORCO) 5-325 MG per tablet Take 1 tablet by mouth every 6 (six) hours as needed for pain.  30 tablet  0  . HYDROcodone-acetaminophen (VICODIN)  5-500 MG per tablet       . losartan-hydrochlorothiazide (HYZAAR) 100-25 MG per tablet Take 1 tablet by mouth daily.       [name] Kitchen MISC NATURAL PRODUCTS ER PO Take 1 capsule by mouth daily. Align probiotic       . montelukast (SINGULAIR) 10 MG tablet Take 10 mg by mouth at bedtime.      . Multiple Vitamins-Minerals (CENTRUM SILVER PO) Take by mouth daily.      . non-metallic deodorant Thornton Papas) MISC Apply 1 application topically daily as needed.      . potassium chloride SA (K-DUR,KLOR-CON) 20 MEQ tablet TAKE 1 TABLET BY MOUTH TWICE DAILY  180 tablet  0  . predniSONE (STERAPRED UNI-PAK) 5 MG TABS Take by mouth.      . REQUIP 1 MG tablet as needed.      . simvastatin (ZOCOR) 40 MG tablet Take 40 mg by mouth at bedtime.      .  topiramate (TOPAMAX) 100 MG tablet Take 100 mg by mouth 2 (two) times daily.      . Wound Cleansers (RADIAPLEX EX) Apply topically.        SURGICAL HISTORY:  Past Surgical History  Procedure Date  . Tonsillectomy     as child  . Colonoscopy   . Mastectomy partial / lumpectomy w/ axillary lymphadenectomy 07/31/11    Left Breast : Invasive Ductal Carcinoma: 0/3  Nodes Negative: ER 100%, PR 0%, Her 2Neu Negative    REVIEW OF SYSTEMS:  Pertinent items are noted in HPI.   PHYSICAL EXAMINATION: General appearance: alert, cooperative and appears stated age Neck: no adenopathy, no carotid bruit, no JVD, supple, symmetrical, trachea midline and thyroid not enlarged, symmetric, no tenderness/mass/nodules Lymph nodes: Cervical, supraclavicular, and axillary nodes normal. Resp: clear to auscultation bilaterally and normal percussion bilaterally Back: symmetric, no curvature. ROM normal. No CVA tenderness. Cardio: regular rate and rhythm, S1, S2 normal, no murmur, click, rub or gallop GI: soft, non-tender; bowel sounds normal; no masses,  no organomegaly Extremities: extremities normal, atraumatic, no cyanosis or edema Neurologic: Grossly normal Left breast less erythematous nontender not swollen.  ECOG PERFORMANCE STATUS: 0 - Asymptomatic  Blood pressure 95/63, pulse 116, temperature 98.7 F (37.1 C), temperature source Oral, resp. rate 20, height 5\' 5"  (1.651 m), weight 216 lb 9.6 oz (98.249 kg).  LABORATORY DATA: Lab Results  Component Value Date   WBC 7.3 01/18/2012   HGB 13.3 01/18/2012   HCT 40.0 01/18/2012   MCV 92.0 01/18/2012   PLT 297 01/18/2012      Chemistry      Component Value Date/Time   NA 137 01/05/2012 0954   NA 141 07/28/2011 1615   K 3.0* 01/05/2012 0954   K 3.2* 07/28/2011 1615   CL 103 01/05/2012 0954   CL 101 07/28/2011 1615   CO2 25 01/05/2012 0954   CO2 24 07/28/2011 1615   BUN 19.0 01/05/2012 0954   BUN 19 07/28/2011 1615   CREATININE 1.3* 01/05/2012 0954    CREATININE 0.93 07/28/2011 1615      Component Value Date/Time   CALCIUM 9.3 01/05/2012 0954   CALCIUM 9.5 07/28/2011 1615   ALKPHOS 102 01/05/2012 0954   ALKPHOS 94 07/28/2011 1615   AST 15 01/05/2012 0954   AST 27 07/28/2011 1615   ALT 18 01/05/2012 0954   ALT 29 07/28/2011 1615   BILITOT 0.75 01/05/2012 0954   BILITOT 0.9 07/28/2011 1615     REASON FOR ADDENDUM, AMENDMENT OR CORRECTION:  [id number]: Oncotype result 08/12/11 02:17:51 PM (gt) FINAL DIAGNOSIS Diagnosis 1. Lymph node, sentinel, biopsy, Left axilla - ONE LYMPH NODE, NEGATIVE FOR TUMOR (0/1) 2. Lymph node, sentinel, biopsy, Left axilla - ONE LYMPH NODE, NEGATIVE FOR TUMOR (0/1) 3. Lymph node, sentinel, biopsy, Left axilla - ONE LYMPH NODE, NEGATIVE FOR TUMOR (0/1) 4. Breast, lumpectomy, Left - INVASIVE DUCTAL CARCINOMA, GRADE II (1.8 CM). - NO LYMPHOVASCULAR INVASION IDENTIFIED. - INVASIVE TUMOR IS 0.3 CM FROM NEAREST MARGIN (POSTERIOR). - DUCTAL CARCINOMA IN SITU. - SEE TUMOR SYNOPTIC TEMPLATE BELOW. Microscopic Comment 4. BREAST, INVASIVE TUMOR, WITH LYMPH NODE SAMPLING Specimen, including laterality: Left breast Procedure: Lumpectomy Grade: II of III Tubule formation: 3 Nuclear pleomorphism: 2 Mitotic:1 Tumor size (gross measurement): 1.8 cm Margins: Invasive, distance to closest margin: 0.3 cm In-situ, distance to closest margin: 0.3 cm (posterior) If margin positive, focally or broadly: N/A Lymphovascular invasion: Absent Ductal carcinoma in situ: Present Grade: II-III 1 of 3 Amended copy Addendum FINAL for Mazon, Leah A ([id number]) Microscopic Comment(continued) Extensive intraductal component: Absent Lobular neoplasia: Absent Tumor focality: Unifocal Treatment effect: None If present, treatment effect in breast tissue, lymph nodes or both: N/A Extent of tumor: Skin: N/A Nipple: N/A Skeletal muscle: N/A Lymph nodes: # examined: 3 Lymph nodes with metastasis: 0 Breast prognostic  profile: Estrogen receptor: Not repeated, previous study demonstrated 100% positivity ([id number]) Progesterone receptor: Not repeated, previous study demonstrated 0% positivity ([id number]) Her 2 neu: Repeated, previous study demonstrated no amplification ([id number]) Ki-67: Not repeated, previous study demonstrated 9% proliferation rate ([id number]) Non-neoplastic breast: Radial/complex sclerosis with calcifications, microcalcifications and benign ducts and lobules, and previous biopsy site TNM: pT1c, pN0, pMX (CR:mw 08-03-11) ADDENDUM: Per the oncotype DX report, the breast cancer recurrent score is 18. Patient's with a recurrent score of 18 had an average rate of distance recurrence of 11% (see oncotype report, [phone]) for the entire interpretation. (CRR:gt, 08/12/11) [place] RUND DO Pathologist, Electronic Signature (Case signed 08/12/2011) Specimen Gross and Clinical  RADIOGRAPHIC STUDIES:  Chest 2 View  07/28/2011  *RADIOLOGY REPORT*  Clinical Data: Preop for breast surgery  CHEST - 2 VIEW  Comparison: None.  Findings: The lungs are clear.  Mediastinal contours appear normal. The heart is within normal limits in size.  No bony abnormality is seen.  IMPRESSION: No active lung disease.  Original Report Authenticated By: Juline Patch, M.D.   Nm Sentinel Node Inj-no Rpt (breast)  07/30/2011  CLINICAL DATA: left breast cancer   Sulfur colloid was injected intradermally by the nuclear medicine  technologist for breast cancer sentinel node localization.     Mm Breast Surgical Specimen  07/30/2011  *RADIOLOGY REPORT*  Clinical Data:  61 year old female with left breast cancer.  Wire localization prior to left lumpectomy.  NEEDLE LOCALIZATION WITH MAMMOGRAPHIC GUIDANCE AND SPECIMEN RADIOGRAPH  Patient presents for needle localization prior to left lumpectomy. I met with the patient and we discussed the procedure of needle localization including benefits and alternatives. We discussed the high  likelihood of a successful procedure. We discussed the risks of the procedure, including infection, bleeding, tissue injury, and further surgery. Informed, written consent was given.  Using mammographic guidance, sterile technique, 2% lidocaine and a 7 cm modified Kopans needle, the biopsy clip/mass were localized using a medial approach.  The films are marked for Dr. Luisa Hart.  Specimen radiograph was performed at the [facility], and confirms the biopsy clip, wire and mass present in the tissue sample.  The specimen is marked for pathology.  IMPRESSION:  Needle localization left breast.  No apparent complications.  Original Report Authenticated By: Rosendo Gros, M.D.   Mm Breast Wire Localization Left  07/30/2011  *RADIOLOGY REPORT*  Clinical Data:  61 year old female with left breast cancer.  Wire localization prior to left lumpectomy.  NEEDLE LOCALIZATION WITH MAMMOGRAPHIC GUIDANCE AND SPECIMEN RADIOGRAPH  Patient presents for needle localization prior to left lumpectomy. I met with the patient and we discussed the procedure of needle localization including benefits and alternatives. We discussed the high likelihood of a successful procedure. We discussed the risks of the procedure, including infection, bleeding, tissue injury, and further surgery. Informed, written consent was given.  Using mammographic guidance, sterile technique, 2% lidocaine and a 7 cm modified Kopans needle, the biopsy clip/mass were localized using a medial approach.  The films are marked for Dr. Luisa Hart.  Specimen radiograph was performed at the [facility], and confirms the biopsy clip, wire and mass present in the tissue sample.  The specimen is marked for pathology.  IMPRESSION: Needle localization left breast.  No apparent complications.  Original Report Authenticated By: Rosendo Gros, M.D.    ASSESSMENT: 61 year old female with  #1 stage IA invasive ductal carcinoma ER positive PR  negative HER-2/neu negative with Ki-67 9%. Patient is now status post lumpectomy with sentinel node biopsy. The final pathology showed a 1.8 cm tumor 3 sentinel nodes were negative for metastatic disease. She had Oncotype DX testing performed that gave her a score of 18 with her 5 year recurrence at about 11% and in the low risk category.  #2 She will finish up radiation therapy and then begin Arimidex 1 mg daily. Risks and benefits of Arimidex were discussed with the patient. Literature was also given to her. We had an extensive discussion regarding the rationale. #3 left breast abscess patient was seen by Dr. Harriette Bouillon she had incision and drainage done feels much better she has been on oral antibiotics for this.  #4 bronchitis resolved  PLAN:   #1 patient will continue to be seen by Dr. Luisa Hart for her abscess.  #2 I will see her back in 6 months time for followup.  All questions were answered. The patient knows to call the clinic with any problems, questions or concerns. We can certainly see the patient much sooner if necessary.  I spent 25 minutes counseling the patient face to face. The total time spent in the appointment was 30 minutes.    Drue Second, MD Medical/Oncology [facility] [phone] (beeper) [phone] (Office)  01/18/2012, 2:02 PM

## 2012-01-18 NOTE — Progress Notes (Signed)
Quick Note:  Please call patient: call patient potassium normal no need to take anymore potassium ______

## 2012-01-20 ENCOUNTER — Ambulatory Visit: Payer: Federal, State, Local not specified - PPO | Admitting: Oncology

## 2012-01-20 ENCOUNTER — Other Ambulatory Visit: Payer: Federal, State, Local not specified - PPO | Admitting: Lab

## 2012-01-26 ENCOUNTER — Other Ambulatory Visit: Payer: Federal, State, Local not specified - PPO

## 2012-01-29 ENCOUNTER — Encounter (INDEPENDENT_AMBULATORY_CARE_PROVIDER_SITE_OTHER): Payer: Self-pay | Admitting: Surgery

## 2012-01-29 ENCOUNTER — Ambulatory Visit (INDEPENDENT_AMBULATORY_CARE_PROVIDER_SITE_OTHER): Payer: Federal, State, Local not specified - PPO | Admitting: Surgery

## 2012-01-29 VITALS — BP 126/84 | HR 84 | Resp 14 | Ht 65.0 in | Wt 214.6 lb

## 2012-01-29 DIAGNOSIS — N61 Mastitis without abscess: Secondary | ICD-10-CM

## 2012-01-29 DIAGNOSIS — N611 Abscess of the breast and nipple: Secondary | ICD-10-CM

## 2012-01-29 NOTE — Patient Instructions (Signed)
Remove packing  On Monday.

## 2012-01-29 NOTE — Progress Notes (Signed)
Subjective:     Patient ID: Katelyn Walsh, female   DOB: 1950-10-17, 61 y.o.   MRN: [id number]  HPI patient returns due to redness and swelling of her left breast after I and D 2 weeks. ago. Earlier this year she had a left breast lumpectomy with postoperative radiation therapy for breast cancer. She has been on antibiotics with no resolution of her symptoms. The breast is red, hot and very tender to touch. Cultures were negative.  The redness and swelling are increasing since last visit   Review of Systems  Constitutional: Positive for fever and chills.  HENT: Negative.   Respiratory: Negative.        Objective:   Physical Exam  Constitutional: She appears well-developed and well-nourished.  HENT:  Head: Normocephalic and atraumatic.  Eyes: EOM are normal. Pupils are equal, round, and reactive to light.  Neck: Normal range of motion. Neck supple.  Pulmonary/Chest: Left breast exhibits mass and tenderness. Breasts are asymmetrical.   left breast with more swilling and mild erythema      Assessment:     Left breast abscess recurrent  Recommend incision and drainage today which she agrees.    Plan:     Recommend incision and drainage in the office.  She understands and agrees.under sterile conditions left breast was prepped. 1% lidocaine was used. This was infiltrated in the skin. A #11 blade used to puncture the abscess cavity with copious amounts of pus drained. Cultures taken. Quarter-inch packing put in the wound. She will remove this in 3 days. She will finish her antibiotics which is doxycycline. Return to clinic in 10 days ffor recheck. Patient tolerated procedure well with no complications.

## 2012-02-01 ENCOUNTER — Ambulatory Visit
Admission: RE | Admit: 2012-02-01 | Discharge: 2012-02-01 | Disposition: A | Discharge status: Home / Self Care | Payer: Federal, State, Local not specified - PPO | Source: Ambulatory Visit | Attending: Otolaryngology | Admitting: Otolaryngology

## 2012-02-01 DIAGNOSIS — H905 Unspecified sensorineural hearing loss: Secondary | ICD-10-CM

## 2012-02-01 DIAGNOSIS — H903 Sensorineural hearing loss, bilateral: Secondary | ICD-10-CM

## 2012-02-01 MED ORDER — GADOBENATE DIMEGLUMINE 529 MG/ML IV SOLN
20.0000 mL | Freq: Once | INTRAVENOUS | Status: AC | PRN
  Administered 2012-02-01: 20 mL via INTRAVENOUS

## 2012-02-08 ENCOUNTER — Encounter (INDEPENDENT_AMBULATORY_CARE_PROVIDER_SITE_OTHER): Payer: Federal, State, Local not specified - PPO | Admitting: Surgery

## 2012-02-09 ENCOUNTER — Encounter (INDEPENDENT_AMBULATORY_CARE_PROVIDER_SITE_OTHER): Payer: Self-pay | Admitting: Surgery

## 2012-02-09 ENCOUNTER — Ambulatory Visit (INDEPENDENT_AMBULATORY_CARE_PROVIDER_SITE_OTHER): Payer: Federal, State, Local not specified - PPO | Admitting: Surgery

## 2012-02-09 VITALS — BP 128/76 | HR 76 | Temp 97.8°F | Resp 18 | Ht 64.0 in | Wt 212.0 lb

## 2012-02-09 DIAGNOSIS — N61 Mastitis without abscess: Secondary | ICD-10-CM | POA: Insufficient documentation

## 2012-02-09 HISTORY — DX: Mastitis without abscess: N61.0

## 2012-02-09 NOTE — Patient Instructions (Signed)
Change packing every other day.  Remove while in [place].  Return 2 - 3 weeks

## 2012-02-09 NOTE — Progress Notes (Signed)
Subjective:     Patient ID: Katelyn Walsh, female   DOB: 10-02-1950, 61 y.o.   MRN: [id number]  HPI patient returns due to redness and swelling of her left breast after I and D 2 weeks. ago. Earlier this year she had a left breast lumpectomy with postoperative radiation therapy for breast cancer. She has been on antibiotics with no resolution of her symptoms. The breast is red, hot and very tender to touch. Cultures were negative.  The redness and swelling are increasing since last visit   Review of Systems  Constitutional: Positive for fever and chills.  HENT: Negative.   Respiratory: Negative.        Objective:   Physical Exam  Constitutional: She appears well-developed and well-nourished.  HENT:  Head: Normocephalic and atraumatic.  Eyes: EOM are normal. Pupils are equal, round, and reactive to light.  Neck: Normal range of motion. Neck supple.  Pulmonary/Chest: Left breast exhibits mass and tenderness. Breasts are asymmetrical.   left breast with less  swelling and no  Erythema wound repacked today      Assessment:     Left breast abscess improving but repacked today since there is still drainage but much less      Plan:     Continue to change packing every other day.  Return 3  weeks

## 2012-02-26 ENCOUNTER — Telehealth (INDEPENDENT_AMBULATORY_CARE_PROVIDER_SITE_OTHER): Payer: Self-pay

## 2012-02-26 ENCOUNTER — Encounter (INDEPENDENT_AMBULATORY_CARE_PROVIDER_SITE_OTHER): Payer: Federal, State, Local not specified - PPO | Admitting: Surgery

## 2012-02-26 NOTE — Telephone Encounter (Signed)
Called pt and moved her up to Monday

## 2012-02-26 NOTE — Telephone Encounter (Signed)
Message copied by Brennan Bailey on Fri Feb 26, 2012 12:18 PM ------      Message from: Rise Paganini      Created: Fri Feb 26, 2012 11:52 AM      Regarding: [place]      Contact: [phone]       Patient stated that she isn't feeling well today and needed to cancel her appointment. I rescheduled her for 03/07/12 @ 2:50pm with Dr. Luisa Hart. She stated that she would like an appointment next week because she has been dealing with the infection in her breast for weeks. I informed her that I would have to discuss with you. Please call to discuss. Thank you.

## 2012-02-29 ENCOUNTER — Ambulatory Visit (INDEPENDENT_AMBULATORY_CARE_PROVIDER_SITE_OTHER): Payer: Federal, State, Local not specified - PPO | Admitting: Surgery

## 2012-02-29 ENCOUNTER — Encounter (INDEPENDENT_AMBULATORY_CARE_PROVIDER_SITE_OTHER): Payer: Self-pay | Admitting: Surgery

## 2012-02-29 VITALS — BP 128/76 | HR 84 | Temp 97.8°F | Resp 18 | Ht 65.5 in | Wt 211.2 lb

## 2012-02-29 DIAGNOSIS — N61 Mastitis without abscess: Secondary | ICD-10-CM

## 2012-02-29 NOTE — Patient Instructions (Signed)
Stop packing.  Cover if drainage noted.  Return 1 month.

## 2012-02-29 NOTE — Progress Notes (Signed)
Subjective:     Patient ID: Katelyn Walsh, female   DOB: 1950/07/06, 62 y.o.   MRN: [id number]  HPI patient returns in followup due to left breast mastitis. She removed her packing last week. She's having no significant pain.   Review of Systems  Constitutional: Negative for fever and chills.       Objective:   Physical Exam  Constitutional: She appears well-developed and well-nourished.  Pulmonary/Chest: Left breast exhibits skin change.    Skin: Skin is warm and dry.  Psychiatric: She has a normal mood and affect. Her behavior is normal. Thought content normal.       Assessment:     Left breast mastitis   improving    Plan:     Return 1 month.  Stop packing.

## 2012-03-07 ENCOUNTER — Encounter (INDEPENDENT_AMBULATORY_CARE_PROVIDER_SITE_OTHER): Payer: Federal, State, Local not specified - PPO | Admitting: Surgery

## 2012-03-18 ENCOUNTER — Ambulatory Visit (INDEPENDENT_AMBULATORY_CARE_PROVIDER_SITE_OTHER): Payer: Federal, State, Local not specified - PPO | Admitting: General Surgery

## 2012-03-18 ENCOUNTER — Encounter (INDEPENDENT_AMBULATORY_CARE_PROVIDER_SITE_OTHER): Payer: Self-pay | Admitting: General Surgery

## 2012-03-18 VITALS — BP 119/72 | HR 82 | Temp 97.7°F | Resp 16 | Ht 65.5 in | Wt 212.8 lb

## 2012-03-18 DIAGNOSIS — N61 Mastitis without abscess: Secondary | ICD-10-CM

## 2012-03-18 MED ORDER — DOXYCYCLINE HYCLATE 100 MG PO CAPS: 100.0000 mg | ORAL_CAPSULE | Freq: Two times a day (BID) | ORAL | Status: DC

## 2012-03-18 NOTE — Progress Notes (Signed)
Subjective:     Patient ID: Katelyn Walsh, female   DOB: [birth year], 62 y.o.   MRN: [id number]  HPI 62 year old Caucasian female with a history of left breast lumpectomy and radiation who has had a recurring persistent mastitis and abscess of her left breast comes in because of recurrent drainage from her incision. She also has noted some redness in. Her incision. She denies any fevers or chills. It started draining a day or 2 ago. He reports some generalized mild discomfort around the incision but no overt significant pain. She reports a good appetite. PMHx, PSHx, SOCHx, FAMHx, ALL reviewed and unchanged  Review of Systems 6 point ROS performed and all negative except for above    Objective:   Physical Exam BP 119/72  Pulse 82  Temp 97.7 F (36.5 C) (Temporal)  Resp 16  Ht 5' 5.5" (1.664 m)  Wt 212 lb 12.8 oz (96.525 kg)  BMI 34.87 kg/m2 Alert, NAD Left breast - old incisional contracted scar upper inner quad. Has seropurulent drainage on gauze. Medial aspect of old scar is open about 1cm. Tracks about 3cm laterally. Some cellulitis extending about 1.5cm inferiorly. About 3cm of surrounding induration    Assessment:     Persistent Left breast mastitis/abscess    Plan:     I placed quarter inch iodoform gauze into the wound. We then covered with dry gauze. I encouraged to change the packing strip every other day. I gave her prescription for doxycycline. She is to followup with Dr. Luisa Hart the following Monday for checkup. She is concerned that this is persistent and seems to be a recurrent problem  Katelyn Walsh. Andrey Campanile, MD, FACS General, Bariatric, & Minimally Invasive Surgery [facility], [place]

## 2012-03-18 NOTE — Patient Instructions (Signed)
Pick up prescription at pharmacy Change outer gauze daily or as needed. Removed thin packing strip. Using a q-tip, place some new packing strip into the wound. Leave some of the packing strip on the outside of the wound, cover with gauze

## 2012-03-28 ENCOUNTER — Telehealth: Payer: Self-pay | Admitting: Oncology

## 2012-03-28 ENCOUNTER — Encounter (INDEPENDENT_AMBULATORY_CARE_PROVIDER_SITE_OTHER): Payer: Self-pay | Admitting: Surgery

## 2012-03-28 ENCOUNTER — Ambulatory Visit (INDEPENDENT_AMBULATORY_CARE_PROVIDER_SITE_OTHER): Payer: Federal, State, Local not specified - PPO | Admitting: Surgery

## 2012-03-28 VITALS — BP 124/82 | HR 72 | Temp 97.9°F | Resp 14 | Ht 65.5 in | Wt 210.6 lb

## 2012-03-28 DIAGNOSIS — N6019 Diffuse cystic mastopathy of unspecified breast: Secondary | ICD-10-CM | POA: Insufficient documentation

## 2012-03-28 NOTE — Progress Notes (Signed)
Patient ID: Katelyn Walsh, female   DOB: 1950-12-04, 62 y.o.   MRN: [id number]  No chief complaint on file.   HPI Katelyn Walsh is a 62 y.o. female.  Pt continues to have recurrent left breat abscesses and mastitis despite antibiotics and in office drainage. HPI  Past Medical History  Diagnosis Date  . Back pain   . Migraines   . Seasonal allergies   . Hypertension   . Asthma     allergies  . Sleep apnea     has not used CPAP in two years  . Cancer     left breast  . Depression   . Rash and nonspecific skin eruption 08/13/2011  . Mastitis in female 08/13/2011  . S/P radiation therapy 09/15/11 - 10/28/11    Left Breast  . Hot flashes     Past Surgical History  Procedure Date  . Tonsillectomy     as child  . Colonoscopy   . Mastectomy partial / lumpectomy w/ axillary lymphadenectomy 07/31/11    Left Breast : Invasive Ductal Carcinoma: 0/3  Nodes Negative: ER 100%, PR 0%, Her 2Neu Negative    Family History  Problem Relation Age of Onset  . Cancer Father     brain    Social History History  Substance Use Topics  . Smoking status: Never Smoker   . Smokeless tobacco: Never Used  . Alcohol Use: Yes     Comment: occasional glass of wine    No Known Allergies  Current Outpatient Prescriptions  Medication Sig Dispense Refill  . anastrozole (ARIMIDEX) 1 MG tablet       . azithromycin (ZITHROMAX Z-PAK) 250 MG tablet Take 2 today then 1 every day until completed  6 each  0  . cetirizine (ZYRTEC) 10 MG tablet Take 10 mg by mouth daily.      [name] Kitchen doxepin (SINEQUAN) 50 MG capsule       . doxycycline (VIBRA-TABS) 100 MG tablet Take 1 tablet (100 mg total) by mouth 2 (two) times daily.  28 tablet  0  . doxycycline (VIBRAMYCIN) 100 MG capsule Take 1 capsule (100 mg total) by mouth 2 (two) times daily.  14 capsule  7  . DULoxetine (CYMBALTA) 60 MG capsule Take 60 mg by mouth daily.      [name] Kitchen emollient (BIAFINE) cream Apply 1 application topically daily.      . fluticasone (FLOVENT HFA)  110 MCG/ACT inhaler Inhale 1 puff into the lungs 2 (two) times daily.  1 Inhaler  12  . HYDROcodone-acetaminophen (NORCO) 5-325 MG per tablet Take 1 tablet by mouth every 6 (six) hours as needed for pain.  30 tablet  0  . HYDROcodone-acetaminophen (VICODIN) 5-500 MG per tablet       . losartan-hydrochlorothiazide (HYZAAR) 100-25 MG per tablet Take 1 tablet by mouth daily.       [name] Kitchen MISC NATURAL PRODUCTS ER PO Take 1 capsule by mouth daily. Align probiotic       . montelukast (SINGULAIR) 10 MG tablet Take 10 mg by mouth at bedtime.      . Multiple Vitamins-Minerals (CENTRUM SILVER PO) Take by mouth daily.      . non-metallic deodorant Thornton Papas) MISC Apply 1 application topically daily as needed.      . potassium chloride SA (K-DUR,KLOR-CON) 20 MEQ tablet TAKE 1 TABLET BY MOUTH TWICE DAILY  180 tablet  0  . predniSONE (STERAPRED UNI-PAK) 5 MG TABS Take by mouth.      [name] Kitchen  REQUIP 1 MG tablet as needed.      . simvastatin (ZOCOR) 40 MG tablet Take 40 mg by mouth at bedtime.      . topiramate (TOPAMAX) 100 MG tablet Take 100 mg by mouth 2 (two) times daily.      . Wound Cleansers (RADIAPLEX EX) Apply topically.        Review of Systems Review of Systems  Constitutional: Negative.   HENT: Negative.   Cardiovascular: Negative.   Gastrointestinal: Negative.     Blood pressure 124/82, pulse 72, temperature 97.9 F (36.6 C), resp. rate 14, height 5' 5.5" (1.664 m), weight 210 lb 9.6 oz (95.528 kg).  Physical Exam Physical Exam  Constitutional: She appears well-developed and well-nourished.  Neck: Normal range of motion. Neck supple.  Cardiovascular: Normal rate and regular rhythm.   Pulmonary/Chest: Effort normal and breath sounds normal.    Skin: Skin is warm and dry.      Assessment    Chronic left breast abscess and mastitis secondary to previous lumpectomy and radiation therapy    Plan    She has failed conservative management and will benefit from debridement in the OR.  The  procedure has been discussed with the patient.  Alternative therapies have been discussed with the patient.  Operative risks include bleeding,  Infection,  Organ injury,  Nerve injury,  Blood vessel injury,  DVT,  Pulmonary embolism,  Death,  And possible reoperation.  Medical management risks include worsening of present situation.  The success of the procedure is 50 -90 % at treating patients symptoms.  The patient understands and agrees to proceed.       Pradeep Beaubrun A. 03/28/2012, 9:23 AM

## 2012-03-28 NOTE — Patient Instructions (Signed)
Schedule for left breast debridement.

## 2012-03-28 NOTE — Telephone Encounter (Signed)
Faxed pt medical records to Dr. Holwerda °

## 2012-03-31 ENCOUNTER — Encounter (HOSPITAL_COMMUNITY): Payer: Self-pay | Admitting: Pharmacy Technician

## 2012-04-04 ENCOUNTER — Encounter (HOSPITAL_COMMUNITY): Payer: Self-pay

## 2012-04-04 ENCOUNTER — Encounter (HOSPITAL_COMMUNITY)
Admission: RE | Admit: 2012-04-04 | Discharge: 2012-04-04 | Disposition: A | Discharge status: Home / Self Care | Payer: Federal, State, Local not specified - PPO | Source: Ambulatory Visit | Attending: Surgery | Admitting: Surgery

## 2012-04-04 DIAGNOSIS — Z01812 Encounter for preprocedural laboratory examination: Secondary | ICD-10-CM | POA: Insufficient documentation

## 2012-04-04 LAB — CBC
HCT: 41 % (ref 36.0–46.0)
Hemoglobin: 13.3 g/dL (ref 12.0–15.0)
MCH: 29.8 pg (ref 26.0–34.0)
MCHC: 32.4 g/dL (ref 30.0–36.0)
MCV: 91.7 fL (ref 78.0–100.0)
Platelets: 227 10*3/uL (ref 150–400)
RBC: 4.47 MIL/uL (ref 3.87–5.11)
RDW: 13.1 % (ref 11.5–15.5)
WBC: 5.3 10*3/uL (ref 4.0–10.5)

## 2012-04-04 LAB — BASIC METABOLIC PANEL
BUN: 18 mg/dL (ref 6–23)
CO2: 24 mEq/L (ref 19–32)
Calcium: 9.1 mg/dL (ref 8.4–10.5)
Chloride: 104 mEq/L (ref 96–112)
Creatinine, Ser: 0.93 mg/dL (ref 0.50–1.10)
GFR calc Af Amer: 75 mL/min — ABNORMAL LOW (ref >90)
GFR calc non Af Amer: 65 mL/min — ABNORMAL LOW (ref >90)
Glucose, Bld: 114 mg/dL — ABNORMAL HIGH (ref 70–99)
Potassium: 3.8 mEq/L (ref 3.5–5.1)
Sodium: 139 mEq/L (ref 135–145)

## 2012-04-04 LAB — SURGICAL PCR SCREEN
MRSA, PCR: NEGATIVE
Staphylococcus aureus: POSITIVE — AB

## 2012-04-04 MED ORDER — CHLORHEXIDINE GLUCONATE 4 % EX LIQD
1.0000 "application " | Freq: Once | CUTANEOUS | Status: DC
  Filled 2012-04-04: qty 15

## 2012-04-04 MED ORDER — DEXTROSE 5 % IV SOLN
3.0000 g | INTRAVENOUS | Status: DC
  Filled 2012-04-04: qty 3000

## 2012-04-04 NOTE — Patient Instructions (Addendum)
20 Katelyn Walsh  04/04/2012   Your procedure is scheduled on: 04/06/12  [facility]   Report to [facility] at  0630     AM.  Call this number if you have problems the morning of surgery: [phone]      BRING FLOVENT WITH YOU TO HOSPITAL  Remember:   Do not eat food  Or drink :After Midnight.TUESDAY NIGHT   Take these medicines the morning of surgery with A SIP OF WATER:  Zyrtec, Cymbalta, Topamax, Arimidex       Flovent if needed   .  Contacts, dentures or partial plates can not be worn to surgery  Leave suitcase in the car. After surgery it may be brought to your room.  For patients admitted to the hospital, checkout time is 11:00 AM day of  discharge.             SPECIAL INSTRUCTIONS- SEE [facility] PREPARING FOR SURGERY INSTRUCTION SHEET-     DO NOT WEAR JEWELRY, LOTIONS, POWDERS, OR PERFUMES.  WOMEN-- DO NOT SHAVE LEGS OR UNDERARMS FOR 12 HOURS BEFORE SHOWERS. MEN MAY SHAVE FACE.  Patients discharged the day of surgery will not be allowed to drive home. IF going home the day of surgery, you must have a driver and someone to stay with you for the first 24 hours  Name and phone number of your driver:     CONNIE   Sister in law                                                                   Please read over the following fact sheets that you were given: MRSA Information, Incentive Spirometry Sheet, Blood Transfusion Sheet  Information                                                                                   [name]  PST 336  [id number]                 FAILURE TO FOLLOW THESE INSTRUCTIONS MAY RESULT IN  CANCELLATION   OF YOUR SURGERY                                                  Patient Signature _____________________________                       Cain Sieve RN CALLED AND SPOKE WITH PT BY PHONE UPDATED MEDS, RESPONSIBLE DRIVER FOR DAY OF SURGERY IS TONNIE JONES CELL [phone], NPO AFTER MIDNIGHT ARRIVE 0745 AM 04-27-2012  [facility] ST[facility], FOLLOW ALL PRE OP  HIBICLENS SHOWERS AS PREVIOUSLY INSTRUCTED AND TAKE ALL MEDS AS PREVIOUSLY INSTRUCTED BY PRE OP NURSE. BRING CPAP MASK AND TUBING

## 2012-04-04 NOTE — Progress Notes (Signed)
Chest x ray 11/13, EKG 6/13, LOV Dr [place] Breed 11/13 ALL IN EPIC

## 2012-04-12 ENCOUNTER — Encounter: Payer: Self-pay | Admitting: Medical Oncology

## 2012-04-12 NOTE — Progress Notes (Signed)
Fax sent to [employer] @ [phone] GN:[id number] use of Mastectomy products.

## 2012-04-12 NOTE — Progress Notes (Addendum)
CBC, BMET,  SURG SCREEN2-11-2012 EPIC, 01-05-2012 CHEST 2 VIEW EPIC 07-28-2011 EKG EPIC LOV DR [facility] 01-18-2012 EPIC

## 2012-04-15 ENCOUNTER — Ambulatory Visit (INDEPENDENT_AMBULATORY_CARE_PROVIDER_SITE_OTHER): Payer: Federal, State, Local not specified - PPO | Admitting: Surgery

## 2012-04-15 ENCOUNTER — Encounter (INDEPENDENT_AMBULATORY_CARE_PROVIDER_SITE_OTHER): Payer: Self-pay | Admitting: Surgery

## 2012-04-15 VITALS — BP 112/78 | HR 72 | Temp 97.8°F | Resp 20 | Ht 65.0 in | Wt 211.0 lb

## 2012-04-15 DIAGNOSIS — N6012 Diffuse cystic mastopathy of left breast: Secondary | ICD-10-CM

## 2012-04-15 DIAGNOSIS — N6019 Diffuse cystic mastopathy of unspecified breast: Secondary | ICD-10-CM

## 2012-04-15 HISTORY — DX: Diffuse cystic mastopathy of unspecified breast: N60.19

## 2012-04-15 NOTE — Progress Notes (Signed)
Patient ID: Katelyn Walsh, female   DOB: Nov 02, 1950, 62 y.o.   MRN: [id number]  Chief Complaint  Patient presents with  . Routine Post Op    po breast    HPI Katelyn Walsh is a 62 y.o. female.  Pt continues to have recurrent left breat abscesses and mastitis despite antibiotics and in office drainage. HPI  Past Medical History  Diagnosis Date  . Back pain   . Migraines   . Seasonal allergies   . Hypertension   . Asthma     allergies  . Sleep apnea     has not used CPAP in two years  . Cancer     left breast  . Depression   . Rash and nonspecific skin eruption 08/13/2011  . Mastitis in female 08/13/2011  . S/P radiation therapy 09/15/11 - 10/28/11    Left Breast  . Hot flashes   . History of blood transfusion     Past Surgical History  Procedure Laterality Date  . Tonsillectomy      as child  . Colonoscopy    . Mastectomy partial / lumpectomy w/ axillary lymphadenectomy  07/31/11    Left Breast : Invasive Ductal Carcinoma: 0/3  Nodes Negative: ER 100%, PR 0%, Her 2Neu Negative  . Back surgery  2005    microdiscectomy lumbar 5-6  . Breast surgery      Family History  Problem Relation Age of Onset  . Cancer Father     brain    Social History History  Substance Use Topics  . Smoking status: Never Smoker   . Smokeless tobacco: Never Used  . Alcohol Use: Yes     Comment: occasional glass of wine    No Known Allergies  Current Outpatient Prescriptions  Medication Sig Dispense Refill  . anastrozole (ARIMIDEX) 1 MG tablet Take 1 mg by mouth daily.       [name] Kitchen aspirin EC 81 MG tablet Take 81 mg by mouth daily.      . cetirizine (ZYRTEC) 10 MG tablet Take 10 mg by mouth daily.      [name] Kitchen doxepin (SINEQUAN) 50 MG capsule Take 50 mg by mouth at bedtime.       [name] Kitchen doxycycline (VIBRAMYCIN) 100 MG capsule Take 1 capsule (100 mg total) by mouth 2 (two) times daily.  14 capsule  7  . DULoxetine (CYMBALTA) 60 MG capsule Take 60 mg by mouth every morning.       . fluticasone (FLOVENT  HFA) 110 MCG/ACT inhaler Inhale 1 puff into the lungs 2 (two) times daily.  1 Inhaler  12  . losartan-hydrochlorothiazide (HYZAAR) 100-25 MG per tablet Take 1 tablet by mouth every morning.       . montelukast (SINGULAIR) 10 MG tablet Take 10 mg by mouth at bedtime.      . non-metallic deodorant Thornton Papas) MISC Apply 1 application topically daily as needed.      . Probiotic Product (ALIGN) 4 MG CAPS Take 1 capsule by mouth daily with breakfast.       . REQUIP 1 MG tablet Take 1 mg by mouth daily as needed. For restless leg      . simvastatin (ZOCOR) 40 MG tablet Take 40 mg by mouth at bedtime.      . topiramate (TOPAMAX) 100 MG tablet Take 100 mg by mouth 2 (two) times daily.       No current facility-administered medications for this visit.    Review of Systems Review of Systems  Constitutional: Negative.   HENT: Negative.   Cardiovascular: Negative.   Gastrointestinal: Negative.     Blood pressure 112/78, pulse 72, temperature 97.8 F (36.6 C), resp. rate 20, height 5\' 5"  (1.651 m), weight 211 lb (95.709 kg).  Physical Exam Physical Exam  Constitutional: She appears well-developed and well-nourished.  Neck: Normal range of motion. Neck supple.  Cardiovascular: Normal rate and regular rhythm.   Pulmonary/Chest: Effort normal and breath sounds normal.    Skin: Skin is warm and dry.      Assessment    Chronic left breast abscess and mastitis secondary to previous lumpectomy and radiation therapy    Plan    She has failed conservative management and will benefit from debridement in the OR.  The procedure has been discussed with the patient.  Alternative therapies have been discussed with the patient.  Operative risks include bleeding,  Infection,  Organ injury,  Nerve injury,  Blood vessel injury,  DVT,  Pulmonary embolism,  Death,  And possible reoperation.  Medical management risks include worsening of present situation.  The success of the procedure is 50 -90 % at treating  patients symptoms.  The patient understands and agrees to proceed.       Mikhi Athey A. 04/15/2012, 12:16 PM

## 2012-04-15 NOTE — Patient Instructions (Signed)
Can reschedule surgery if needed.

## 2012-04-22 ENCOUNTER — Encounter (INDEPENDENT_AMBULATORY_CARE_PROVIDER_SITE_OTHER): Payer: Federal, State, Local not specified - PPO | Admitting: Surgery

## 2012-05-09 ENCOUNTER — Encounter (INDEPENDENT_AMBULATORY_CARE_PROVIDER_SITE_OTHER): Payer: Federal, State, Local not specified - PPO | Admitting: Surgery

## 2012-05-27 ENCOUNTER — Other Ambulatory Visit (INDEPENDENT_AMBULATORY_CARE_PROVIDER_SITE_OTHER): Payer: Self-pay | Admitting: Surgery

## 2012-05-27 NOTE — Progress Notes (Signed)
Dr. Luisa Hart,             Please enter preop orders in Epic for Katelyn Walsh - surgery date is 4/16  -  But she is coming to [facility] on Wed 4/9 for her preop labs.                                                     Thanks

## 2012-05-30 ENCOUNTER — Encounter (HOSPITAL_COMMUNITY): Payer: Self-pay | Admitting: Pharmacy Technician

## 2012-06-01 ENCOUNTER — Encounter (HOSPITAL_COMMUNITY): Payer: Self-pay

## 2012-06-01 ENCOUNTER — Encounter (HOSPITAL_COMMUNITY)
Admission: RE | Admit: 2012-06-01 | Discharge: 2012-06-01 | Disposition: A | Discharge status: Home / Self Care | Payer: Federal, State, Local not specified - PPO | Source: Ambulatory Visit | Attending: Surgery | Admitting: Surgery

## 2012-06-01 DIAGNOSIS — N61 Mastitis without abscess: Secondary | ICD-10-CM | POA: Insufficient documentation

## 2012-06-01 DIAGNOSIS — Z01812 Encounter for preprocedural laboratory examination: Secondary | ICD-10-CM | POA: Insufficient documentation

## 2012-06-01 HISTORY — DX: Unspecified open wound of unspecified breast, initial encounter: S21.009A

## 2012-06-01 LAB — BASIC METABOLIC PANEL
BUN: 16 mg/dL (ref 6–23)
CO2: 24 mEq/L (ref 19–32)
Calcium: 9.6 mg/dL (ref 8.4–10.5)
Chloride: 103 mEq/L (ref 96–112)
Creatinine, Ser: 0.92 mg/dL (ref 0.50–1.10)
GFR calc Af Amer: 75 mL/min — ABNORMAL LOW (ref >90)
GFR calc non Af Amer: 65 mL/min — ABNORMAL LOW (ref >90)
Glucose, Bld: 127 mg/dL — ABNORMAL HIGH (ref 70–99)
Potassium: 3.9 mEq/L (ref 3.5–5.1)
Sodium: 138 mEq/L (ref 135–145)

## 2012-06-01 LAB — CBC
HCT: 41.8 % (ref 36.0–46.0)
Hemoglobin: 13.9 g/dL (ref 12.0–15.0)
MCH: 30.3 pg (ref 26.0–34.0)
MCHC: 33.3 g/dL (ref 30.0–36.0)
MCV: 91.3 fL (ref 78.0–100.0)
Platelets: 212 10*3/uL (ref 150–400)
RBC: 4.58 MIL/uL (ref 3.87–5.11)
RDW: 13.3 % (ref 11.5–15.5)
WBC: 5.8 10*3/uL (ref 4.0–10.5)

## 2012-06-01 LAB — SURGICAL PCR SCREEN
MRSA, PCR: NEGATIVE
Staphylococcus aureus: POSITIVE — AB

## 2012-06-01 NOTE — Pre-Procedure Instructions (Addendum)
06-01-12 EKG 6'13, CXR 11'13-Epic. 06-01-12 1650 Pt. Notified of Positive Staph aureus PCR screen- will use Mupirocin as directed.W. Kennon Portela

## 2012-06-01 NOTE — Patient Instructions (Addendum)
20 RIDA LOUDIN  06/01/2012   Your procedure is scheduled on:  4-16 -2014  Report to [facility] at    0930     AM .  Call this number if you have problems the morning of surgery: [phone]  Or Presurgical Testing [phone]([place])   Do not eat food:After Midnight.    Take these medicines the morning of surgery with A SIP OF WATER: Zyrtec. Cymbalta. Topamax. Stop Aspirin x 5 days prior.   Do not wear jewelry, make-up or nail polish.  Do not wear lotions, powders, or perfumes. You may wear deodorant.  Do not shave 12 hours prior to first CHG shower(legs and under arms).(face and neck okay.)  Do not bring valuables to the hospital.  Contacts, dentures or bridgework,body piercing,  may not be worn into surgery.  Leave suitcase in the car. After surgery it may be brought to your room.  For patients admitted to the hospital, checkout time is 11:00 AM the day of discharge.   Patients discharged the day of surgery will not be allowed to drive home. Must have responsible person with you x 24 hours once discharged.  Name and phone number of your driver: [phone] cell  Special Instructions: CHG(Chlorhedine 4%-"Hibiclens","Betasept","Aplicare") Shower Use Special Wash: see special instructions.(avoid face and genitals)   Please read over the following fact sheets that you were given: MRSA Information.    Failure to follow these instructions may result in Cancellation of your surgery.   Patient signature_______________________________________________________

## 2012-06-22 ENCOUNTER — Telehealth: Payer: Self-pay | Admitting: Medical Oncology

## 2012-06-22 NOTE — Progress Notes (Signed)
Dr Luisa Hart-  PLEASE ADD NEW PRE OP ORDERS- some of previous orders were already completed from last PST visit at [facility]  Thanks

## 2012-06-22 NOTE — Telephone Encounter (Signed)
Patient called to inform office that she is scheduled for a left breast debridement on 05/15. Asking if Dr Welton Flakes wants to see her before this appt, pt sched to see lab/NP 05/16. Reviewed with MD, patient to keep scheduled appt on 05/16 d/t her debridement appt should not affect her office visit with [place]. LVMOM for patient to call back with any other questions or concerns.  Patient did state she will be out of town next week, so unable to r/s anything for next week.

## 2012-06-23 ENCOUNTER — Telehealth: Payer: Self-pay | Admitting: Medical Oncology

## 2012-06-23 NOTE — Telephone Encounter (Signed)
During f/u conversation regarding patient keeping appt sched for 05/16, patient stated that she has been itching and has a rash over different areas on her body from the arimidex for about 6-7 weeks now.  Will review with MD.

## 2012-06-23 NOTE — Telephone Encounter (Signed)
Per MD, patient to stop arimidex until patient's sched appt 05/16 and will review further options. Patient expressed verbal understanding. Knows to call office with questions or concerns.

## 2012-06-27 ENCOUNTER — Other Ambulatory Visit (INDEPENDENT_AMBULATORY_CARE_PROVIDER_SITE_OTHER): Payer: Self-pay | Admitting: Surgery

## 2012-07-01 ENCOUNTER — Other Ambulatory Visit (HOSPITAL_COMMUNITY): Payer: Self-pay | Admitting: Surgery

## 2012-07-04 ENCOUNTER — Encounter (HOSPITAL_COMMUNITY)
Admission: RE | Admit: 2012-07-04 | Discharge: 2012-07-04 | Disposition: A | Discharge status: Home / Self Care | Payer: Federal, State, Local not specified - PPO | Source: Ambulatory Visit | Attending: Surgery | Admitting: Surgery

## 2012-07-04 ENCOUNTER — Encounter (HOSPITAL_COMMUNITY): Payer: Self-pay

## 2012-07-04 LAB — CBC
HCT: 41.2 % (ref 36.0–46.0)
Hemoglobin: 13.4 g/dL (ref 12.0–15.0)
MCH: 29.7 pg (ref 26.0–34.0)
MCHC: 32.5 g/dL (ref 30.0–36.0)
MCV: 91.4 fL (ref 78.0–100.0)
Platelets: 226 10*3/uL (ref 150–400)
RBC: 4.51 MIL/uL (ref 3.87–5.11)
RDW: 13.4 % (ref 11.5–15.5)
WBC: 5.8 10*3/uL (ref 4.0–10.5)

## 2012-07-04 LAB — BASIC METABOLIC PANEL
BUN: 13 mg/dL (ref 6–23)
CO2: 23 mEq/L (ref 19–32)
Calcium: 9.4 mg/dL (ref 8.4–10.5)
Chloride: 104 mEq/L (ref 96–112)
Creatinine, Ser: 0.96 mg/dL (ref 0.50–1.10)
GFR calc Af Amer: 71 mL/min — ABNORMAL LOW (ref >90)
GFR calc non Af Amer: 62 mL/min — ABNORMAL LOW (ref >90)
Glucose, Bld: 116 mg/dL — ABNORMAL HIGH (ref 70–99)
Potassium: 3.5 mEq/L (ref 3.5–5.1)
Sodium: 138 mEq/L (ref 135–145)

## 2012-07-04 LAB — SURGICAL PCR SCREEN
MRSA, PCR: NEGATIVE
Staphylococcus aureus: NEGATIVE

## 2012-07-04 NOTE — Patient Instructions (Addendum)
Katelyn Walsh  07/04/2012                           YOUR PROCEDURE IS SCHEDULED ON: 07/07/12               PLEASE REPORT TO [facility] STAY CENTER AT : 11:30 am               CALL THIS NUMBER IF ANY PROBLEMS THE DAY OF SURGERY :               [phone]                      REMEMBER:   Do not eat food or drink liquids AFTER MIDNIGHT  May have clear liquids UNTIL 6 HOURS BEFORE SURGERY (8:00 am)  Clear liquids include soda, tea, black coffee, apple or grape juice, broth.  Take these medicines the morning of surgery with A SIP OF WATER:  ZYRTEC / CYMBALTA / TOPAMAX   Do not wear jewelry, make-up   Do not wear lotions, powders, or perfumes.   Do not shave legs or underarms 12 hrs. before surgery (men may shave face)  Do not bring valuables to the hospital.  Contacts, dentures or bridgework may not be worn into surgery.  Leave suitcase in the car. After surgery it may be brought to your room.  For patients admitted to the hospital more than one night, checkout time is 11:00                          The day of discharge.   Patients discharged the day of surgery will not be allowed to drive home                             If going home same day of surgery, must have someone stay with you first                           24 hrs at home and arrange for some one to drive you home from hospital.    Special Instructions:   Please read over the following fact sheets that you were given:               1. MRSA  INFORMATION                      2. [place] PREPARING FOR SURGERY SHEET               3. DISCONTINUE ASPIRIN AND HERBAL MEDICATIONS 5 DAYS PREOP                                                X_____________________________________________________________________        Failure to follow these instructions may result in cancellation of your surgery

## 2012-07-07 ENCOUNTER — Ambulatory Visit (HOSPITAL_COMMUNITY)
Admission: RE | Admit: 2012-07-07 | Discharge: 2012-07-07 | Disposition: A | Discharge status: Home / Self Care | Payer: Federal, State, Local not specified - PPO | Source: Ambulatory Visit | Attending: Surgery | Admitting: Surgery

## 2012-07-07 ENCOUNTER — Encounter (HOSPITAL_COMMUNITY): Payer: Self-pay | Admitting: *Deleted

## 2012-07-07 ENCOUNTER — Telehealth (INDEPENDENT_AMBULATORY_CARE_PROVIDER_SITE_OTHER): Payer: Self-pay | Admitting: General Surgery

## 2012-07-07 ENCOUNTER — Ambulatory Visit (HOSPITAL_COMMUNITY): Payer: Federal, State, Local not specified - PPO | Admitting: Anesthesiology

## 2012-07-07 ENCOUNTER — Encounter (HOSPITAL_COMMUNITY)
Admission: RE | Disposition: A | Discharge status: Home / Self Care | Payer: Self-pay | Source: Ambulatory Visit | Attending: Surgery

## 2012-07-07 ENCOUNTER — Encounter (HOSPITAL_COMMUNITY): Payer: Self-pay | Admitting: Anesthesiology

## 2012-07-07 DIAGNOSIS — F3289 Other specified depressive episodes: Secondary | ICD-10-CM | POA: Insufficient documentation

## 2012-07-07 DIAGNOSIS — G43909 Migraine, unspecified, not intractable, without status migrainosus: Secondary | ICD-10-CM | POA: Insufficient documentation

## 2012-07-07 DIAGNOSIS — Z7982 Long term (current) use of aspirin: Secondary | ICD-10-CM | POA: Insufficient documentation

## 2012-07-07 DIAGNOSIS — G4730 Sleep apnea, unspecified: Secondary | ICD-10-CM | POA: Insufficient documentation

## 2012-07-07 DIAGNOSIS — Z9889 Other specified postprocedural states: Secondary | ICD-10-CM

## 2012-07-07 DIAGNOSIS — N61 Mastitis without abscess: Secondary | ICD-10-CM

## 2012-07-07 DIAGNOSIS — N6039 Fibrosclerosis of unspecified breast: Secondary | ICD-10-CM

## 2012-07-07 DIAGNOSIS — Z79899 Other long term (current) drug therapy: Secondary | ICD-10-CM | POA: Insufficient documentation

## 2012-07-07 DIAGNOSIS — N611 Abscess of the breast and nipple: Secondary | ICD-10-CM

## 2012-07-07 DIAGNOSIS — J45909 Unspecified asthma, uncomplicated: Secondary | ICD-10-CM | POA: Insufficient documentation

## 2012-07-07 DIAGNOSIS — M549 Dorsalgia, unspecified: Secondary | ICD-10-CM | POA: Insufficient documentation

## 2012-07-07 DIAGNOSIS — I10 Essential (primary) hypertension: Secondary | ICD-10-CM | POA: Insufficient documentation

## 2012-07-07 DIAGNOSIS — Z923 Personal history of irradiation: Secondary | ICD-10-CM | POA: Insufficient documentation

## 2012-07-07 DIAGNOSIS — IMO0002 Reserved for concepts with insufficient information to code with codable children: Secondary | ICD-10-CM | POA: Insufficient documentation

## 2012-07-07 DIAGNOSIS — T8189XA Other complications of procedures, not elsewhere classified, initial encounter: Secondary | ICD-10-CM

## 2012-07-07 DIAGNOSIS — Y838 Other surgical procedures as the cause of abnormal reaction of the patient, or of later complication, without mention of misadventure at the time of the procedure: Secondary | ICD-10-CM | POA: Insufficient documentation

## 2012-07-07 DIAGNOSIS — N6012 Diffuse cystic mastopathy of left breast: Secondary | ICD-10-CM

## 2012-07-07 DIAGNOSIS — C50212 Malignant neoplasm of upper-inner quadrant of left female breast: Secondary | ICD-10-CM

## 2012-07-07 DIAGNOSIS — Z853 Personal history of malignant neoplasm of breast: Secondary | ICD-10-CM | POA: Insufficient documentation

## 2012-07-07 DIAGNOSIS — F329 Major depressive disorder, single episode, unspecified: Secondary | ICD-10-CM | POA: Insufficient documentation

## 2012-07-07 DIAGNOSIS — R21 Rash and other nonspecific skin eruption: Secondary | ICD-10-CM

## 2012-07-07 HISTORY — PX: BREAST BIOPSY: SHX20

## 2012-07-07 SURGERY — BREAST BIOPSY
Anesthesia: General | Site: Breast | Laterality: Left | Wound class: Contaminated

## 2012-07-07 MED ORDER — LACTATED RINGERS IV SOLN: INTRAVENOUS | Status: DC

## 2012-07-07 MED ORDER — ACETAMINOPHEN 10 MG/ML IV SOLN
INTRAVENOUS | Status: DC | PRN
  Administered 2012-07-07: 1000 mg via INTRAVENOUS

## 2012-07-07 MED ORDER — BUPIVACAINE-EPINEPHRINE 0.25% -1:200000 IJ SOLN
INTRAMUSCULAR | Status: AC
  Filled 2012-07-07: qty 1

## 2012-07-07 MED ORDER — OXYCODONE-ACETAMINOPHEN 5-325 MG PO TABS: 2.0000 | ORAL_TABLET | ORAL | Status: DC | PRN

## 2012-07-07 MED ORDER — CEFAZOLIN SODIUM-DEXTROSE 2-3 GM-% IV SOLR
INTRAVENOUS | Status: AC
  Filled 2012-07-07: qty 50

## 2012-07-07 MED ORDER — PROPOFOL INFUSION 10 MG/ML OPTIME
INTRAVENOUS | Status: DC | PRN
  Administered 2012-07-07: 200 mL via INTRAVENOUS
  Administered 2012-07-07: 50 mL via INTRAVENOUS

## 2012-07-07 MED ORDER — LACTATED RINGERS IV SOLN
INTRAVENOUS | Status: DC
  Administered 2012-07-07: 1000 mL via INTRAVENOUS

## 2012-07-07 MED ORDER — LIDOCAINE HCL (CARDIAC) 20 MG/ML IV SOLN
INTRAVENOUS | Status: DC | PRN
  Administered 2012-07-07: 100 mg via INTRAVENOUS

## 2012-07-07 MED ORDER — CHLORHEXIDINE GLUCONATE 4 % EX LIQD: 1.0000 "application " | Freq: Once | CUTANEOUS | Status: DC

## 2012-07-07 MED ORDER — FENTANYL CITRATE 0.05 MG/ML IJ SOLN
INTRAMUSCULAR | Status: DC | PRN
  Administered 2012-07-07 (×2): 50 ug via INTRAVENOUS

## 2012-07-07 MED ORDER — FENTANYL CITRATE 0.05 MG/ML IJ SOLN: 25.0000 ug | INTRAMUSCULAR | Status: DC | PRN

## 2012-07-07 MED ORDER — ACETAMINOPHEN 10 MG/ML IV SOLN
INTRAVENOUS | Status: AC
  Filled 2012-07-07: qty 100

## 2012-07-07 MED ORDER — MEPERIDINE HCL 50 MG/ML IJ SOLN: 6.2500 mg | INTRAMUSCULAR | Status: DC | PRN

## 2012-07-07 MED ORDER — CEFAZOLIN SODIUM-DEXTROSE 2-3 GM-% IV SOLR
2.0000 g | INTRAVENOUS | Status: AC
  Administered 2012-07-07: 2 g via INTRAVENOUS

## 2012-07-07 MED ORDER — PROMETHAZINE HCL 25 MG/ML IJ SOLN: 6.2500 mg | INTRAMUSCULAR | Status: DC | PRN

## 2012-07-07 MED ORDER — LACTATED RINGERS IV SOLN
INTRAVENOUS | Status: DC | PRN
  Administered 2012-07-07: 13:00:00 via INTRAVENOUS

## 2012-07-07 MED ORDER — MIDAZOLAM HCL 5 MG/5ML IJ SOLN
INTRAMUSCULAR | Status: DC | PRN
  Administered 2012-07-07 (×2): 1 mg via INTRAVENOUS

## 2012-07-07 MED ORDER — DIPHENHYDRAMINE HCL 50 MG/ML IJ SOLN
INTRAMUSCULAR | Status: AC
  Filled 2012-07-07: qty 1

## 2012-07-07 MED ORDER — 0.9 % SODIUM CHLORIDE (POUR BTL) OPTIME
TOPICAL | Status: DC | PRN
  Administered 2012-07-07: 1000 mL

## 2012-07-07 MED ORDER — DIPHENHYDRAMINE HCL 50 MG/ML IJ SOLN
12.5000 mg | Freq: Once | INTRAMUSCULAR | Status: AC
  Administered 2012-07-07: 12.5 mg via INTRAVENOUS

## 2012-07-07 MED ORDER — ONDANSETRON HCL 4 MG/2ML IJ SOLN
INTRAMUSCULAR | Status: DC | PRN
  Administered 2012-07-07 (×2): 2 mg via INTRAVENOUS

## 2012-07-07 MED ORDER — OXYCODONE-ACETAMINOPHEN 5-325 MG PO TABS
2.0000 | ORAL_TABLET | ORAL | Status: DC | PRN
  Administered 2012-07-07: 2 via ORAL
  Filled 2012-07-07: qty 2

## 2012-07-07 SURGICAL SUPPLY — 32 items
BLADE HEX COATED 2.75 (ELECTRODE) ×2 IMPLANT
BLADE SURG 15 STRL LF DISP TIS (BLADE) ×1 IMPLANT
BLADE SURG 15 STRL SS (BLADE) ×2
BLADE SURG SZ10 CARB STEEL (BLADE) ×2 IMPLANT
CANISTER SUCTION 2500CC (MISCELLANEOUS) ×2 IMPLANT
CLOTH BEACON ORANGE TIMEOUT ST (SAFETY) ×2 IMPLANT
DECANTER SPIKE VIAL GLASS SM (MISCELLANEOUS) ×2 IMPLANT
DRAPE LAPAROTOMY TRNSV 102X78 (DRAPE) ×2 IMPLANT
ELECT REM PT RETURN 9FT ADLT (ELECTROSURGICAL) ×2
ELECTRODE REM PT RTRN 9FT ADLT (ELECTROSURGICAL) ×1 IMPLANT
GAUZE SPONGE 4X4 16PLY XRAY LF (GAUZE/BANDAGES/DRESSINGS) ×2 IMPLANT
GLOVE BIOGEL PI IND STRL 7.0 (GLOVE) ×1 IMPLANT
GLOVE BIOGEL PI INDICATOR 7.0 (GLOVE) ×1
GLOVE INDICATOR 8.0 STRL GRN (GLOVE) ×4 IMPLANT
GLOVE SS BIOGEL STRL SZ 8 (GLOVE) ×1 IMPLANT
GLOVE SUPERSENSE BIOGEL SZ 8 (GLOVE) ×1
GOWN STRL NON-REIN LRG LVL3 (GOWN DISPOSABLE) ×2 IMPLANT
GOWN STRL REIN XL XLG (GOWN DISPOSABLE) ×4 IMPLANT
KIT BASIN OR (CUSTOM PROCEDURE TRAY) ×2 IMPLANT
MARKER SKIN DUAL TIP RULER LAB (MISCELLANEOUS) ×2 IMPLANT
NEEDLE HYPO 22GX1.5 SAFETY (NEEDLE) ×2 IMPLANT
NEEDLE HYPO 25X1 1.5 SAFETY (NEEDLE) IMPLANT
NS IRRIG 1000ML POUR BTL (IV SOLUTION) ×2 IMPLANT
PACK BASIC VI WITH GOWN DISP (CUSTOM PROCEDURE TRAY) ×2 IMPLANT
PENCIL BUTTON HOLSTER BLD 10FT (ELECTRODE) ×2 IMPLANT
SOL PREP POV-IOD 16OZ 10% (MISCELLANEOUS) ×2 IMPLANT
SPONGE GAUZE 4X4 12PLY (GAUZE/BANDAGES/DRESSINGS) ×4 IMPLANT
SYR BULB IRRIGATION 50ML (SYRINGE) ×1 IMPLANT
SYR CONTROL 10ML LL (SYRINGE) ×2 IMPLANT
TAPE CLOTH SURG 4X10 WHT LF (GAUZE/BANDAGES/DRESSINGS) ×1 IMPLANT
TOWEL OR 17X26 10 PK STRL BLUE (TOWEL DISPOSABLE) ×2 IMPLANT
YANKAUER SUCT BULB TIP 10FT TU (MISCELLANEOUS) ×2 IMPLANT

## 2012-07-07 NOTE — Consult Note (Signed)
  Currently admitted as of 07/07/2012  Demographics Katelyn Walsh 62 year old female  Comm Pref: None [id number]  [place]  [id number] [phone] (M) [phone] (H) Works at OTHER [RETIRED  Problem ListHospitalization ProblemNon-Hospital  Cancer of upper-inner quadrant of female breast  Post-operative state  Rash and nonspecific skin eruption  Mastitis in female  Breast abscess  Acute mastitis of left breast  Chronic mastitis  Mastitis chronic  Significant History/Details  Smoking: Never Smoker   Smokeless Tobacco: Never Used  Alcohol: Yes  5 open orders  Preferred Language: English   Specialty CommentsEditShow AllReportDOS:6/6/13[facility]-OP-left NL(11:15 lump w/slnbx(1:15)/gen/kh 07/02/11 07/02/2011 patient scheduled for op surgery 07/30/2011 @ [facility] no precert required. (kh,chm) 08/07/11 Pt signed PHI for Connie Jones (09/08/?)bnr DOS 04/06/12 [facility]-OP- Lt breast debridement/de 03/28/12 03/28/2012 patient scheduled for op surgery 04/06/2012 @ [facility] no precert required. (de,chm)  04/05/2012 patient surgery originally scheduled for 04/06/2012 has been rescheduled for a new date of 04/27/2012 @ [facility] per change sheet rec'd Tiranda C no precert required. (chm)  2.11.14 pt called and cancelled DOS 2.12.14/change sheet out/tlc 2.24.14 pt called and requested a new date for surgery/change sheet out/tlc DOS 4.3.14 [facility]-OP- Lt breast debridement/2.24.14 tlc 04/18/2012 patient surgery 04/27/2012 rescheduled for 05/26/2012 per change sheet rec'd Tiranda C no precert required. (chm)  3.6.14 pt called and requested new date for surgery/change sheet out/tlc DOS 4.16.14 [facility]-OP- Lt breast debridement/3.6.14 tlc 04/28/2012 patient surgery 05/26/2012 has been rescheduled for a new date 06/08/2012 @ [facility] per change sheet Tiranda C no precert required. (chm)  4.14.14 DOS 4.16.14 change to 5.15.14/change sheet out/tlc DOS 5.15.14 [facility]-OP- Lt breast debridement/4.14.14 tlc 06/06/2012 patient  surgery resch 06/08/2012 has now been changed to a new date of 07/07/2012 @ [facility] per change sheet rec'd Tiranda C no precert required. (chm)    MedicationsHospital Medications Outpatient Medications  ceFAZolin (ANCEF) IVPB 2 g/50 mL premix  chlorhexidine (HIBICLENS) 4 % liquid 1 application  lactated ringers infusion    Relevant Labs (3 years)  Na K Cl C02 WBC Hgb Hct Plts  07/04/12 1335 138 3.5 104 -- -- -- -- --  07/04/12 1335 -- -- -- -- 5.8 13.4 41.2 226  06/01/12 1445 -- -- -- -- 5.8 13.9 41.8 212  06/01/12 1445 138 3.9 103 -- -- -- -- --  04/04/12 1440 -- -- -- -- 5.3 13.3 41.0 227                  Relevant Encounters (Maximum of 5 visits)Date Type Department Provider Description  07/07/2012 Surgery [facility]OPER[facility]. Drucilla Cumber A., MD   07/01/2012 Orders Only [facility]PRE-S[facility] Nashiya Disbrow A., MD   06/27/2012 Orders Only [facility] Suhaib Guzzo A., MD   05/27/2012 Orders Only [facility] Jaylie Neaves A., MD   04/15/2012 Office Visit [facility] Tippi Mccrae A., MD Mastitis Chronic, Left (Primary Dx)          My Last Outpatient Progress NoteStatus Last Edited Encounter Date  Signed Fri Apr 15, 2012 12:16 PM EST 04/15/2012  Patient ID: Katelyn Walsh, female   DOB: 12/25/1950, 61 y.o.   MRN: [id number]    Chief Complaint   Patient presents with   .  Routine Post Op       po breast      HPI Katelyn Walsh is a 61 y.o. female.  Pt continues to have recurrent left breat abscesses and mastitis despite antibiotics and in office   drainage. HPI    Past Medical History   Diagnosis  Date   .  Back pain     .  Migraines     .  Seasonal allergies     .  Hypertension     .  Asthma         allergies   .  Sleep apnea         has not used CPAP in two years   .  Cancer         left breast   .  Depression     .  Rash and nonspecific skin eruption  08/13/2011    .  Mastitis in female  08/13/2011   .  S/P radiation therapy  09/15/11 - 10/28/11       Left Breast   .  Hot flashes     .  History of blood transfusion         Past Surgical History   Procedure  Laterality  Date   .  Tonsillectomy           as child   .  Colonoscopy       .  Mastectomy partial / lumpectomy w/ axillary lymphadenectomy    07/31/11       Left Breast : Invasive Ductal Carcinoma: 0/3  Nodes Negative: ER 100%, PR 0%, Her 2Neu Negative   .  Back surgery    2005       microdiscectomy lumbar 5-6   .  Breast surgery           Family History   Problem  Relation  Age of Onset   .  Cancer  Father         brain      Social History History   Substance Use Topics   .  Smoking status:  Never Smoker    .  Smokeless tobacco:  Never Used   .  Alcohol Use:  Yes         Comment: occasional glass of wine      No Known Allergies    Current Outpatient Prescriptions   Medication  Sig  Dispense  Refill   .  anastrozole (ARIMIDEX) 1 MG tablet  Take 1 mg by mouth daily.          .  aspirin EC 81 MG tablet  Take 81 mg by mouth daily.         .  cetirizine (ZYRTEC) 10 MG tablet  Take 10 mg by mouth daily.         .  doxepin (SINEQUAN) 50 MG capsule  Take 50 mg by mouth at bedtime.          .  doxycycline (VIBRAMYCIN) 100 MG capsule  Take 1 capsule (100 mg total) by mouth 2 (two) times daily.   14 capsule   7   .  DULoxetine (CYMBALTA) 60 MG capsule  Take 60 mg by mouth every morning.          .  fluticasone (FLOVENT HFA) 110 MCG/ACT inhaler  Inhale 1 puff into the lungs 2 (two) times daily.   1 Inhaler   12   .  losartan-hydrochlorothiazide (HYZAAR) 100-25 MG per tablet  Take 1 tablet by mouth every morning.          .  montelukast (SINGULAIR) 10 MG tablet  Take 10 mg by mouth at bedtime.         .    non-metallic deodorant (ALRA) MISC  Apply 1 application topically daily as needed.         .  Probiotic Product (ALIGN) 4 MG CAPS  Take 1 capsule by mouth daily with breakfast.           .  REQUIP 1 MG tablet  Take 1 mg by mouth daily as needed. For restless leg         .  simvastatin (ZOCOR) 40 MG tablet  Take 40 mg by mouth at bedtime.         .  topiramate (TOPAMAX) 100 MG tablet  Take 100 mg by mouth 2 (two) times daily.             No current facility-administered medications for this visit.      Review of Systems Review of Systems  Constitutional: Negative.   HENT: Negative.   Cardiovascular: Negative.   Gastrointestinal: Negative.       Blood pressure 112/78, pulse 72, temperature 97.8 F (36.6 C), resp. rate 20, height 5' 5" (1.651 m), weight 211 lb (95.709 kg).   Physical Exam Physical Exam  Constitutional: She appears well-developed and well-nourished.  Neck: Normal range of motion. Neck supple.  Cardiovascular: Normal rate and regular rhythm.   Pulmonary/Chest: Effort normal and breath sounds normal.    Skin: Skin is warm and dry.          Assessment Chronic left breast abscess and mastitis secondary to previous lumpectomy and radiation therapy   Plan She has failed conservative management and will benefit from debridement in the OR.  The procedure has been discussed with the patient.  Alternative therapies have been discussed with the patient.  Operative risks include bleeding,  Infection,  Organ injury,  Nerve injury,  Blood vessel injury,  DVT,  Pulmonary embolism,  Death,  And possible reoperation.  Medical management risks include worsening of present situation.  The success of the procedure is 50 -90 % at treating patients symptoms.  The patient understands and agrees to proceed.       Tillmon Kisling A. 07/07/2012 

## 2012-07-07 NOTE — Telephone Encounter (Signed)
Arline Asp at [facility] was contacted for skilled nursing [facility] for 07-08-12  Wet to dry dressings BID  They will call back on confirm if they can start on 07/08/12  If not they will be able to start with care on 07/09/12

## 2012-07-07 NOTE — Anesthesia Preprocedure Evaluation (Signed)
Anesthesia Evaluation  Patient identified by MRN, date of birth, ID band Patient awake    Reviewed: Allergy & Precautions, H&P , NPO status , Patient's Chart, lab work & pertinent test results, reviewed documented beta blocker date and time   Airway Mallampati: II TM Distance: >3 FB Neck ROM: full    Dental   Pulmonary asthma , sleep apnea and Continuous Positive Airway Pressure Ventilation ,          Cardiovascular hypertension, On Medications     Neuro/Psych  Headaches, PSYCHIATRIC DISORDERS negative neurological ROS     GI/Hepatic negative GI ROS, Neg liver ROS,   Endo/Other  negative endocrine ROSMorbid obesity  Renal/GU negative Renal ROS  negative genitourinary   Musculoskeletal   Abdominal   Peds  Hematology negative hematology ROS (+)   Anesthesia Other Findings See surgeon's H&P   Reproductive/Obstetrics negative OB ROS                           Anesthesia Physical  Anesthesia Plan  ASA: III  Anesthesia Plan: General   Post-op Pain Management:    Induction: Intravenous  Airway Management Planned: LMA  Additional Equipment:   Intra-op Plan:   Post-operative Plan: Extubation in OR  Informed Consent: I have reviewed the patients History and Physical, chart, labs and discussed the procedure including the risks, benefits and alternatives for the proposed anesthesia with the patient or authorized representative who has indicated his/her understanding and acceptance.   Dental Advisory Given  Plan Discussed with: CRNA and Surgeon  Anesthesia Plan Comments:         Anesthesia Quick Evaluation

## 2012-07-07 NOTE — Interval H&P Note (Signed)
History and Physical Interval Note:  07/07/2012 12:57 PM  Katelyn Walsh  has presented today for surgery, with the diagnosis of left breast mastitis  The various methods of treatment have been discussed with the patient and family. After consideration of risks, benefits and other options for treatment, the patient has consented to  Procedure(s) with comments: Left Breast Debridement (Left) - Left Breast Debridement as a surgical intervention .  The patient's history has been reviewed, patient examined, no change in status, stable for surgery.  I have reviewed the patient's chart and labs.  Questions were answered to the patient's satisfaction.     [name]   

## 2012-07-07 NOTE — Transfer of Care (Signed)
Immediate Anesthesia Transfer of Care Note  Patient: Katelyn Walsh  Procedure(s) Performed: Procedure(s) with comments: Left Breast Debridement (Left) - Left Breast Debridement  Patient Location: PACU  Anesthesia Type:General  Level of Consciousness: awake, alert , oriented and patient cooperative  Airway & Oxygen Therapy: Patient Spontanous Breathing and aerosol face mask  Post-op Assessment: Report given to PACU RN and Post -op Vital signs reviewed and stable  Post vital signs: stable  Complications: No apparent anesthesia complications

## 2012-07-07 NOTE — Progress Notes (Signed)
Pt c/o itchy nose; Dr. Acey Lav notified, med ordered and given

## 2012-07-07 NOTE — Brief Op Note (Signed)
07/07/2012  1:43 PM  PATIENT:  Cassandria Santee  62 y.o. female  PRE-OPERATIVE DIAGNOSIS:  left breast mastitis  POST-OPERATIVE DIAGNOSIS:  left breast mastitis  PROCEDURE:  Procedure(s) with comments: Left Breast Debridement (Left) - Left Breast Debridement  SURGEON:  Surgeon(s) and Role:    * Nygeria Lager A. Mujtaba Bollig, MD - Primary   ASSISTANTS: none   ANESTHESIA:   local and general  EBL:     BLOOD ADMINISTERED:none  DRAINS: none   LOCAL MEDICATIONS USED:  BUPIVICAINE   SPECIMEN:  Source of Specimen:  left breast lumpectomy cavity  DISPOSITION OF SPECIMEN:  PATHOLOGY  COUNTS:  YES  TOURNIQUET:  * No tourniquets in log *  DICTATION: .Other Dictation: Dictation Number [id number]  PLAN OF CARE: Discharge to home after PACU  PATIENT DISPOSITION:  PACU - hemodynamically stable.   Delay start of Pharmacological VTE agent (>24hrs) due to surgical blood loss or risk of bleeding: not applicable

## 2012-07-07 NOTE — Anesthesia Postprocedure Evaluation (Signed)
  Anesthesia Post-op Note  Patient: Katelyn Walsh  Procedure(s) Performed: Procedure(s) (LRB): Left Breast Debridement (Left)  Patient Location: PACU  Anesthesia Type: General  Level of Consciousness: awake and alert   Airway and Oxygen Therapy: Patient Spontanous Breathing  Post-op Pain: mild  Post-op Assessment: Post-op Vital signs reviewed, Patient's Cardiovascular Status Stable, Respiratory Function Stable, Patent Airway and No signs of Nausea or vomiting  Last Vitals:  Filed Vitals:   07/07/12 1430  BP: 123/66  Pulse: 78  Temp:   Resp: 14    Post-op Vital Signs: stable   Complications: No apparent anesthesia complications

## 2012-07-07 NOTE — Interval H&P Note (Signed)
History and Physical Interval Note:  07/07/2012 12:57 PM  Katelyn Walsh  has presented today for surgery, with the diagnosis of left breast mastitis  The various methods of treatment have been discussed with the patient and family. After consideration of risks, benefits and other options for treatment, the patient has consented to  Procedure(s) with comments: Left Breast Debridement (Left) - Left Breast Debridement as a surgical intervention .  The patient's history has been reviewed, patient examined, no change in status, stable for surgery.  I have reviewed the patient's chart and labs.  Questions were answered to the patient's satisfaction.     [name] A.

## 2012-07-07 NOTE — H&P (View-Only) (Signed)
Currently admitted as of 07/07/2012  Demographics Katelyn Walsh 62 year old female  Comm Pref: None [id number]  [place] [address] [phone] (H) Works at OTHER [RETIRED  Problem ListHospitalization ProblemNon-Hospital  Cancer of upper-inner quadrant of female breast  Post-operative state  Rash and nonspecific skin eruption  Mastitis in female  Breast abscess  Acute mastitis of left breast  Chronic mastitis  Mastitis chronic  Significant History/Details  Smoking: Never Smoker   Smokeless Tobacco: Never Used  Alcohol: Yes  5 open orders  Preferred Language: English   Specialty CommentsEditShow AllReportDOS:6/6/13TC-[facility]-OP-left NL(11:15 lump w/slnbx(1:15)/gen/kh 07/02/11 07/02/2011 patient scheduled for op surgery 07/30/2011 @ [facility] no precert required. (kh,chm) 08/07/11 Pt signed PHI for Jorene Minors (09/08/?)bnr DOS 04/06/12 [facility]-OP- Lt breast debridement/de 03/28/12 03/28/2012 patient scheduled for op surgery 04/06/2012 @ [facility] no precert required. (de,chm)  04/05/2012 patient surgery originally scheduled for 04/06/2012 has been rescheduled for a new date of 04/27/2012 @ [facility] per change sheet rec'd Tiranda C no precert required. (chm)  2.11.14 pt called and cancelled DOS 2.12.14/change sheet out/tlc 2.24.14 pt called and requested a new date for surgery/change sheet out/tlc DOS 4.3.14 [facility]-OP- Lt breast debridement/2.24.14 tlc 04/18/2012 patient surgery 04/27/2012 rescheduled for 05/26/2012 per change sheet rec'd Tiranda C no precert required. (chm)  3.6.14 pt called and requested new date for surgery/change sheet out/tlc DOS 4.16.14 [facility]-OP- Lt breast debridement/3.6.14 tlc 04/28/2012 patient surgery 05/26/2012 has been rescheduled for a new date 06/08/2012 @ [facility] per change sheet Tiranda C no precert required. (chm)  4.14.14 DOS 4.16.14 change to 5.15.14/change sheet out/tlc DOS 5.15.14 [facility]-OP- Lt breast debridement/4.14.14 tlc 06/06/2012 patient  surgery resch 06/08/2012 has now been changed to a new date of 07/07/2012 @ [facility] per change sheet rec'd Tiranda C no precert required. (chm)    MedicationsHospital Medications Outpatient Medications  ceFAZolin (ANCEF) IVPB 2 g/50 mL premix  chlorhexidine (HIBICLENS) 4 % liquid 1 application  lactated ringers infusion    Relevant Labs (3 years)  Na K Cl C02 WBC Hgb Hct Plts  07/04/12 1335 138 3.5 104 -- -- -- -- --  07/04/12 1335 -- -- -- -- 5.8 13.4 41.2 226  06/01/12 1445 -- -- -- -- 5.8 13.9 41.8 212  06/01/12 1445 138 3.9 103 -- -- -- -- --  04/04/12 1440 -- -- -- -- 5.3 13.3 41.0 227                  Relevant Encounters (Maximum of 5 visits)Date Type Department Provider Description  07/07/2012 Surgery [facility]-OPERATING [facility]. Sally-Anne Wamble A., MD   07/01/2012 Orders Only [facility]-PRE-[facility] Hideko Esselman A., MD   06/27/2012 Orders Only [facility] Harriette Bouillon A., MD   05/27/2012 Orders Only [facility] Dortha Schwalbe., MD   04/15/2012 Office Visit [facility] [place] Dortha Schwalbe., MD Mastitis Chronic, Left (Primary Dx)          My Last Outpatient Progress NoteStatus Last Edited Encounter Date  Signed Fri Apr 15, 2012 12:16 PM EST 04/15/2012  Patient ID: Katelyn Walsh, female   DOB: 1950-04-06, 62 y.o.   MRN: [id number]    Chief Complaint   Patient presents with   .  Routine Post Op       po breast      HPI Katelyn Walsh is a 62 y.o. female.  Pt continues to have recurrent left breat abscesses and mastitis despite antibiotics and in office  drainage. HPI    Past Medical History   Diagnosis  Date   .  Back pain     .  Migraines     .  Seasonal allergies     .  Hypertension     .  Asthma         allergies   .  Sleep apnea         has not used CPAP in two years   .  Cancer         left breast   .  Depression     .  Rash and nonspecific skin eruption  08/13/2011    .  Mastitis in female  08/13/2011   .  S/P radiation therapy  09/15/11 - 10/28/11       Left Breast   .  Hot flashes     .  History of blood transfusion         Past Surgical History   Procedure  Laterality  Date   .  Tonsillectomy           as child   .  Colonoscopy       .  Mastectomy partial / lumpectomy w/ axillary lymphadenectomy    07/31/11       Left Breast : Invasive Ductal Carcinoma: 0/3  Nodes Negative: ER 100%, PR 0%, Her 2Neu Negative   .  Back surgery    2005       microdiscectomy lumbar 5-6   .  Breast surgery           Family History   Problem  Relation  Age of Onset   .  Cancer  Father         brain      Social History History   Substance Use Topics   .  Smoking status:  Never Smoker    .  Smokeless tobacco:  Never Used   .  Alcohol Use:  Yes         Comment: occasional glass of wine      No Known Allergies    Current Outpatient Prescriptions   Medication  Sig  Dispense  Refill   .  anastrozole (ARIMIDEX) 1 MG tablet  Take 1 mg by mouth daily.          [name] Kitchen  aspirin EC 81 MG tablet  Take 81 mg by mouth daily.         .  cetirizine (ZYRTEC) 10 MG tablet  Take 10 mg by mouth daily.         [name] Kitchen  doxepin (SINEQUAN) 50 MG capsule  Take 50 mg by mouth at bedtime.          [name] Kitchen  doxycycline (VIBRAMYCIN) 100 MG capsule  Take 1 capsule (100 mg total) by mouth 2 (two) times daily.   14 capsule   7   .  DULoxetine (CYMBALTA) 60 MG capsule  Take 60 mg by mouth every morning.          .  fluticasone (FLOVENT HFA) 110 MCG/ACT inhaler  Inhale 1 puff into the lungs 2 (two) times daily.   1 Inhaler   12   .  losartan-hydrochlorothiazide (HYZAAR) 100-25 MG per tablet  Take 1 tablet by mouth every morning.          .  montelukast (SINGULAIR) 10 MG tablet  Take 10 mg by mouth at bedtime.         [name] Kitchen  non-metallic deodorant (ALRA) MISC  Apply 1 application topically daily as needed.         .  Probiotic Product (ALIGN) 4 MG CAPS  Take 1 capsule by mouth daily with breakfast.           .  REQUIP 1 MG tablet  Take 1 mg by mouth daily as needed. For restless leg         .  simvastatin (ZOCOR) 40 MG tablet  Take 40 mg by mouth at bedtime.         .  topiramate (TOPAMAX) 100 MG tablet  Take 100 mg by mouth 2 (two) times daily.             No current facility-administered medications for this visit.      Review of Systems Review of Systems  Constitutional: Negative.   HENT: Negative.   Cardiovascular: Negative.   Gastrointestinal: Negative.       Blood pressure 112/78, pulse 72, temperature 97.8 F (36.6 C), resp. rate 20, height 5\' 5"  (1.651 m), weight 211 lb (95.709 kg).   Physical Exam Physical Exam  Constitutional: She appears well-developed and well-nourished.  Neck: Normal range of motion. Neck supple.  Cardiovascular: Normal rate and regular rhythm.   Pulmonary/Chest: Effort normal and breath sounds normal.    Skin: Skin is warm and dry.          Assessment Chronic left breast abscess and mastitis secondary to previous lumpectomy and radiation therapy   Plan She has failed conservative management and will benefit from debridement in the OR.  The procedure has been discussed with the patient.  Alternative therapies have been discussed with the patient.  Operative risks include bleeding,  Infection,  Organ injury,  Nerve injury,  Blood vessel injury,  DVT,  Pulmonary embolism,  Death,  And possible reoperation.  Medical management risks include worsening of present situation.  The success of the procedure is 50 -90 % at treating patients symptoms.  The patient understands and agrees to proceed.       Andilyn Bettcher A. 07/07/2012

## 2012-07-08 ENCOUNTER — Other Ambulatory Visit: Payer: Federal, State, Local not specified - PPO | Admitting: Lab

## 2012-07-08 ENCOUNTER — Telehealth (INDEPENDENT_AMBULATORY_CARE_PROVIDER_SITE_OTHER): Payer: Self-pay | Admitting: General Surgery

## 2012-07-08 ENCOUNTER — Encounter (HOSPITAL_COMMUNITY): Payer: Self-pay | Admitting: Surgery

## 2012-07-08 ENCOUNTER — Ambulatory Visit (HOSPITAL_BASED_OUTPATIENT_CLINIC_OR_DEPARTMENT_OTHER): Payer: Federal, State, Local not specified - PPO | Admitting: Adult Health

## 2012-07-08 ENCOUNTER — Encounter (INDEPENDENT_AMBULATORY_CARE_PROVIDER_SITE_OTHER): Payer: Federal, State, Local not specified - PPO

## 2012-07-08 ENCOUNTER — Telehealth: Payer: Self-pay | Admitting: *Deleted

## 2012-07-08 VITALS — BP 95/59 | HR 89 | Temp 98.6°F | Resp 20 | Ht 65.0 in | Wt 225.1 lb

## 2012-07-08 DIAGNOSIS — C50212 Malignant neoplasm of upper-inner quadrant of left female breast: Secondary | ICD-10-CM

## 2012-07-08 DIAGNOSIS — C50219 Malignant neoplasm of upper-inner quadrant of unspecified female breast: Secondary | ICD-10-CM

## 2012-07-08 DIAGNOSIS — E559 Vitamin D deficiency, unspecified: Secondary | ICD-10-CM

## 2012-07-08 NOTE — Op Note (Signed)
NAME:  Katelyn Walsh, Katelyn Walsh                  ACCOUNT NO.:  [id number]  MEDICAL RECORD NO.:  [id number]  LOCATION:  [facility]                         FACILITY:  [facility]  PHYSICIAN:  Breniya Goertzen A. Elzy Tomasello, M.D.DATE OF BIRTH:  15-Mar-1950  DATE OF PROCEDURE:  07/07/2012 DATE OF DISCHARGE:  07/07/2012                              OPERATIVE REPORT   PREOPERATIVE DIAGNOSIS:  History of left breast cancer, status post lumpectomy with chronic left breast wound and chronic mastitis.  POSTOPERATIVE DIAGNOSIS:  History of left breast cancer, status post lumpectomy with chronic left breast wound and chronic mastitis.  PROCEDURE:  Debridement of left breast open wound and the lumpectomy cavity.  SURGEON:  Maisie Fus A. Edda Orea, M.D.  ANESTHESIA:  LMA with 0.25% Sensorcaine local.  ESTIMATED BLOOD LOSS:  Minimal.  SPECIMEN:  Lumpectomy cavity and chronic sinus tract to pathology.  DRAINS:  None.  IV FLUIDS:  Approximately 500 mL of crystalloid.  INDICATIONS FOR PROCEDURE:  The patient presents with a nonhealing left breast wound.  This had been packed and opened since earlier this year. She underwent lumpectomy last year followed by radiation therapy, initially did quite well.  She did develop an infected seroma, has been drained and unfortunately has not been able to heal on its own.  It has been opened and drained.  Conservative measures have been tried.  I recommended debridement at this point in time, back to healthy tissue to see if we get this area to heal on her left breast.  Risks, benefits, and alternative therapies were discussed with the patient.  She voiced understanding and wished to proceed.  DESCRIPTION OF PROCEDURE:  The patient was met in the holding area. Questions were answered.  Left breast was marked.  She was taken back to the operative room where she was placed supine on the operating room table.  After induction of LMA anesthesia, the left breast was prepped and draped in the sterile  fashion.  Time-out was done.  She received preoperative antibiotics.  The old packing was removed.  In the left central upper breast, there was a 1 cm opening.  I excised this with a scalpel.  I went ahead and excised the entire lumpectomy cavity until the entire cavity of breast tissue without any signs of chronic inflammation.  This was then irrigated and found be hemostatic.  It was packed with a saline soaked 4 x 4 dressing.  Dry dressings were applied.  All final counts of sponge, needle, and instruments found to be correct at this portion of the case.  The patient was then extubated, taken to recovery in satisfactory condition. All final counts were found to be correct.     Drue Harr A. Leviathan Macera, M.D.     TAC/MEDQ  D:  07/07/2012  T:  07/08/2012  Job:  [id number]

## 2012-07-08 NOTE — Progress Notes (Signed)
OFFICE PROGRESS NOTE  CC  Katelyn Penna, MD [address] [place] [address] Dr. Lonie Peak Dr. Harriette Bouillon  DIAGNOSIS: 62 year old female with new diagnosis of stage I invasive ductal carcinoma of the left breast diagnosed in May 2013.  PRIOR THERAPY:  #1 patient was originally seen in the multidisciplinary breast clinic after she had a ultrasound core needle biopsy performed that showed a invasive ductal carcinoma of the left breast at the 10:00 position. Since then she has gone on to have a lumpectomy with sentinel node biopsy. Her final pathology Showed a 1.8 cm invasive ductal carcinoma that was ER positive 100% PR Negative HER-2/neu negative grade 2 with KI 67 9%. Patient had 3 sentinel nodes that were negative for metastatic disease.  #2 patient had an Oncotype DX testing performed that showed the recurrence score Of 18. Giving her 11% risk of recurrence at 5 years with tamoxifen alone. This put her in the low-risk category. And does only antiestrogen therapy is being recommended after radiation.  #3 patient is status post radiation therapy.  #3 patient began Arimidex 1 mg daily adjuvantly.   CURRENT THERAPY: Arimidex 1 mg daily   INTERVAL HISTORY: Katelyn Walsh 62 y.o. female returns for Followup. She was prescribed Arimidex in 12/2012, and has been itching accompanied with her hot flashes, and that started in February 2014.  She stopped the arimidex a couple of weeks ago, however she does continue to intermittently itch, just not as much.  She denies fevers, dryness, joint aches, or any further concerns. She did have her left breast debrided by Dr. Luisa Hart yesterday for a non-healing abscess.  She is recovering well from the procedure. We updated her health maintenance below. Otherwise, a 10 point ROS is neg.    MEDICAL HISTORY: Past Medical History  Diagnosis Date  . Back pain   . Migraines   . Seasonal allergies   . Hypertension   . Asthma     allergies  .  Sleep apnea     has not used CPAP in two years  . Depression   . Rash and nonspecific skin eruption 08/13/2011  . Mastitis in female 08/13/2011  . S/P radiation therapy 09/15/11 - 10/28/11    Left Breast  . Hot flashes   . History of blood transfusion   . Cancer     left breast-last radiation 9'13  . Open breast wound     left -none draining-uses iodoform gauze daily.    ALLERGIES:  has No Known Allergies.  MEDICATIONS:  Current Outpatient Prescriptions  Medication Sig Dispense Refill  . anastrozole (ARIMIDEX) 1 MG tablet Take 1 mg by mouth daily.       [name] Kitchen aspirin EC 81 MG tablet Take 81 mg by mouth daily.      . cetirizine (ZYRTEC) 10 MG tablet Take 10 mg by mouth daily.      [name] Kitchen doxepin (SINEQUAN) 50 MG capsule Take 50 mg by mouth at bedtime.       . DULoxetine (CYMBALTA) 60 MG capsule Take 60 mg by mouth every morning.       . fluticasone (FLOVENT HFA) 110 MCG/ACT inhaler Inhale 1 puff into the lungs 2 (two) times daily.  1 Inhaler  12  . losartan-hydrochlorothiazide (HYZAAR) 100-25 MG per tablet Take 1 tablet by mouth every morning.       . montelukast (SINGULAIR) 10 MG tablet Take 10 mg by mouth at bedtime.      . non-metallic deodorant Thornton Papas) MISC  Apply 1 application topically daily as needed.      [name] Kitchen oxyCODONE-acetaminophen (ROXICET) 5-325 MG per tablet Take 2 tablets by mouth every 4 (four) hours as needed for pain.  30 tablet  0  . Probiotic Product (ALIGN) 4 MG CAPS Take 1 capsule by mouth daily with breakfast.       . REQUIP 1 MG tablet Take 1 mg by mouth daily as needed. For restless leg      . simvastatin (ZOCOR) 40 MG tablet Take 40 mg by mouth at bedtime.      . topiramate (TOPAMAX) 100 MG tablet Take 100 mg by mouth 2 (two) times daily.       No current facility-administered medications for this visit.    SURGICAL HISTORY:  Past Surgical History  Procedure Laterality Date  . Tonsillectomy      as child  . Colonoscopy      polyps removed in past  . Mastectomy  partial / lumpectomy w/ axillary lymphadenectomy  07/31/11    Left Breast : Invasive Ductal Carcinoma: 0/3  Nodes Negative: ER 100%, PR 0%, Her 2Neu Negative  . Back surgery  2005    microdiscectomy lumbar 5-6  . Breast surgery    . Breast biopsy Left 07/07/2012    Procedure: Left Breast Debridement;  Surgeon: Maisie Fus A. Cornett, MD;  Location: [facility];  Service: General;  Laterality: Left;  Left Breast Debridement    REVIEW OF SYSTEMS:  Pertinent items are noted in HPI.   Health Maintenance Mammogram: 06/2011 Colonoscopy: 01/2012 Bone Density Scan: 01/14/2012  Pap Smear: 06/18/2011 Eye Exam: 03/29/2012 Vitamin D Level: 12/2011 Lipid Panel: 06/2011   PHYSICAL EXAMINATION: BP 95/59  Pulse 89  Temp(Src) 98.6 F (37 C) (Oral)  Resp 20  Ht 5\' 5"  (1.651 m)  Wt 225 lb 1.6 oz (102.105 kg)  BMI 37.46 kg/m2 General: Patient is a well appearing female in no acute distress HEENT: PERRLA, sclerae anicteric no conjunctival pallor, MMM Neck: supple, no palpable adenopathy Lungs: clear to auscultation bilaterally, no wheezes, rhonchi, or rales Cardiovascular: regular rate rhythm, S1, S2, no murmurs, rubs or gallops Abdomen: Soft, non-tender, non-distended, normoactive bowel sounds, no HSM Extremities: warm and well perfused, no clubbing, cyanosis, or edema Skin: No rashes or lesions Neuro: Non-focal Left breast with bandage covering upper aspect, dressing clean dry and intact.  Right breast, no masses, nodularity.   ECOG PERFORMANCE STATUS: 0 - Asymptomatic    LABORATORY DATA: Lab Results  Component Value Date   WBC 5.8 07/04/2012   HGB 13.4 07/04/2012   HCT 41.2 07/04/2012   MCV 91.4 07/04/2012   PLT 226 07/04/2012      Chemistry      Component Value Date/Time   NA 138 07/04/2012 1335   NA 140 01/18/2012 1314   K 3.5 07/04/2012 1335   K 4.2 01/18/2012 1314   CL 104 07/04/2012 1335   CL 107 01/18/2012 1314   CO2 23 07/04/2012 1335   CO2 24 01/18/2012 1314   BUN 13 07/04/2012 1335    BUN 18.0 01/18/2012 1314   CREATININE 0.96 07/04/2012 1335   CREATININE 1.1 01/18/2012 1314      Component Value Date/Time   CALCIUM 9.4 07/04/2012 1335   CALCIUM 9.3 01/18/2012 1314   ALKPHOS 107 01/18/2012 1314   ALKPHOS 94 07/28/2011 1615   AST 19 01/18/2012 1314   AST 27 07/28/2011 1615   ALT 19 01/18/2012 1314   ALT 29 07/28/2011 1615   BILITOT 0.62  01/18/2012 1314   BILITOT 0.9 07/28/2011 1615     REASON FOR ADDENDUM, AMENDMENT OR CORRECTION: [id number]: Oncotype result 08/12/11 02:17:51 PM (gt) FINAL DIAGNOSIS Diagnosis 1. Lymph node, sentinel, biopsy, Left axilla - ONE LYMPH NODE, NEGATIVE FOR TUMOR (0/1) 2. Lymph node, sentinel, biopsy, Left axilla - ONE LYMPH NODE, NEGATIVE FOR TUMOR (0/1) 3. Lymph node, sentinel, biopsy, Left axilla - ONE LYMPH NODE, NEGATIVE FOR TUMOR (0/1) 4. Breast, lumpectomy, Left - INVASIVE DUCTAL CARCINOMA, GRADE II (1.8 CM). - NO LYMPHOVASCULAR INVASION IDENTIFIED. - INVASIVE TUMOR IS 0.3 CM FROM NEAREST MARGIN (POSTERIOR). - DUCTAL CARCINOMA IN SITU. - SEE TUMOR SYNOPTIC TEMPLATE BELOW. Microscopic Comment 4. BREAST, INVASIVE TUMOR, WITH LYMPH NODE SAMPLING Specimen, including laterality: Left breast Procedure: Lumpectomy Grade: II of III Tubule formation: 3 Nuclear pleomorphism: 2 Mitotic:1 Tumor size (gross measurement): 1.8 cm Margins: Invasive, distance to closest margin: 0.3 cm In-situ, distance to closest margin: 0.3 cm (posterior) If margin positive, focally or broadly: N/A Lymphovascular invasion: Absent Ductal carcinoma in situ: Present Grade: II-III 1 of 3 Amended copy Addendum FINAL for Delay, Ailyne A ([id number]) Microscopic Comment(continued) Extensive intraductal component: Absent Lobular neoplasia: Absent Tumor focality: Unifocal Treatment effect: None If present, treatment effect in breast tissue, lymph nodes or both: N/A Extent of tumor: Skin: N/A Nipple: N/A Skeletal muscle: N/A Lymph nodes: # examined:  3 Lymph nodes with metastasis: 0 Breast prognostic profile: Estrogen receptor: Not repeated, previous study demonstrated 100% positivity ([id number]) Progesterone receptor: Not repeated, previous study demonstrated 0% positivity ([id number]) Her 2 neu: Repeated, previous study demonstrated no amplification ([id number]) Ki-67: Not repeated, previous study demonstrated 9% proliferation rate ([id number]) Non-neoplastic breast: Radial/complex sclerosis with calcifications, microcalcifications and benign ducts and lobules, and previous biopsy site TNM: pT1c, pN0, pMX (CR:mw 08-03-11) ADDENDUM: Per the oncotype DX report, the breast cancer recurrent score is 18. Patient's with a recurrent score of 18 had an average rate of distance recurrence of 11% (see oncotype report, [phone]) for the entire interpretation. (CRR:gt, 08/12/11) [place] RUND DO Pathologist, Electronic Signature (Case signed 08/12/2011) Specimen Gross and Clinical  RADIOGRAPHIC STUDIES:  Chest 2 View  07/28/2011  *RADIOLOGY REPORT*  Clinical Data: Preop for breast surgery  CHEST - 2 VIEW  Comparison: None.  Findings: The lungs are clear.  Mediastinal contours appear normal. The heart is within normal limits in size.  No bony abnormality is seen.  IMPRESSION: No active lung disease.  Original Report Authenticated By: Juline Patch, M.D.   Nm Sentinel Node Inj-no Rpt (breast)  07/30/2011  CLINICAL DATA: left breast cancer   Sulfur colloid was injected intradermally by the nuclear medicine  technologist for breast cancer sentinel node localization.     Mm Breast Surgical Specimen  07/30/2011  *RADIOLOGY REPORT*  Clinical Data:  62 year old female with left breast cancer.  Wire localization prior to left lumpectomy.  NEEDLE LOCALIZATION WITH MAMMOGRAPHIC GUIDANCE AND SPECIMEN RADIOGRAPH  Patient presents for needle localization prior to left lumpectomy. I met with the patient and we discussed the procedure of needle localization including  benefits and alternatives. We discussed the high likelihood of a successful procedure. We discussed the risks of the procedure, including infection, bleeding, tissue injury, and further surgery. Informed, written consent was given.  Using mammographic guidance, sterile technique, 2% lidocaine and a 7 cm modified Kopans needle, the biopsy clip/mass were localized using a medial approach.  The films are marked for Dr. Luisa Hart.  Specimen radiograph was performed at the [facility], and confirms the biopsy  clip, wire and mass present in the tissue sample.  The specimen is marked for pathology.  IMPRESSION: Needle localization left breast.  No apparent complications.  Original Report Authenticated By: Rosendo Gros, M.D.   Mm Breast Wire Localization Left  07/30/2011  *RADIOLOGY REPORT*  Clinical Data:  62 year old female with left breast cancer.  Wire localization prior to left lumpectomy.  NEEDLE LOCALIZATION WITH MAMMOGRAPHIC GUIDANCE AND SPECIMEN RADIOGRAPH  Patient presents for needle localization prior to left lumpectomy. I met with the patient and we discussed the procedure of needle localization including benefits and alternatives. We discussed the high likelihood of a successful procedure. We discussed the risks of the procedure, including infection, bleeding, tissue injury, and further surgery. Informed, written consent was given.  Using mammographic guidance, sterile technique, 2% lidocaine and a 7 cm modified Kopans needle, the biopsy clip/mass were localized using a medial approach.  The films are marked for Dr. Luisa Hart.  Specimen radiograph was performed at the [facility], and confirms the biopsy clip, wire and mass present in the tissue sample.  The specimen is marked for pathology.  IMPRESSION: Needle localization left breast.  No apparent complications.  Original Report Authenticated By: Rosendo Gros, M.D.    ASSESSMENT: 62 year old female with  #1 stage  IA invasive ductal carcinoma ER positive PR negative HER-2/neu negative with Ki-67 9%. Patient is now status post lumpectomy with sentinel node biopsy. The final pathology showed a 1.8 cm tumor 3 sentinel nodes were negative for metastatic disease. She had Oncotype DX testing performed that gave her a score of 18 with her 5 year recurrence at about 11% and in the low risk category.  #2 She will finish up radiation therapy and then begin Arimidex 1 mg daily. Risks and benefits of Arimidex were discussed with the patient. Literature was also given to her. We had an extensive discussion regarding the rationale.  #3 left breast abscess patient was seen by Dr. Harriette Bouillon she ultimately required debridement on 07/07/12.   PLAN:   #1 Doing well.  Patient will restart Arimidex daily.  Should she begin itching she will call us and we will switch her to a different aromatase inhibitor.    #2 I will see her back in 6 months time for followup.  #3 She will continue to f/u with Dr. Luisa Hart regarding her recent surgical debridement/abscess.   All questions were answered. The patient knows to call the clinic with any problems, questions or concerns. We can certainly see the patient much sooner if necessary.  I spent 25 minutes counseling the patient face to face. The total time spent in the appointment was 30 minutes.   Cherie Ouch Lyn Hollingshead, NP Medical Oncology [facility] Phone: [phone] 07/08/2012, 1:51 PM

## 2012-07-08 NOTE — Patient Instructions (Addendum)
Doing well.  Restart the Arimidex, and if the itching returns, we will change your therapy.  Please call us if you have any questions or concerns.

## 2012-07-08 NOTE — Telephone Encounter (Signed)
appts made and printed...td 

## 2012-07-08 NOTE — Telephone Encounter (Signed)
Spoke with patient she is aware of po f/u visit for 07/20/12. [facility]  Home health will be doing start of care with her  On 07/09/12. She thought [facility]

## 2012-07-12 ENCOUNTER — Telehealth (INDEPENDENT_AMBULATORY_CARE_PROVIDER_SITE_OTHER): Payer: Self-pay

## 2012-07-12 NOTE — Telephone Encounter (Signed)
Message copied by Brennan Bailey on Tue Jul 12, 2012  3:54 PM ------      Message from: Harriette Bouillon A      Created: Tue Jul 12, 2012  7:13 AM       Benign no cancer. ------

## 2012-07-12 NOTE — Telephone Encounter (Signed)
LMOM to return my call. Path benign.

## 2012-07-14 ENCOUNTER — Telehealth (INDEPENDENT_AMBULATORY_CARE_PROVIDER_SITE_OTHER): Payer: Self-pay

## 2012-07-14 NOTE — Telephone Encounter (Signed)
Tammy at [facility] called stating wound bed has increased areas on necrotic tissue. She request pt to be seen before weekend. Pt given appt for 07-15-12 with Dr Luisa Hart. Pt advised of appt.

## 2012-07-15 ENCOUNTER — Ambulatory Visit (INDEPENDENT_AMBULATORY_CARE_PROVIDER_SITE_OTHER): Payer: Federal, State, Local not specified - PPO | Admitting: Surgery

## 2012-07-15 ENCOUNTER — Telehealth (INDEPENDENT_AMBULATORY_CARE_PROVIDER_SITE_OTHER): Payer: Self-pay

## 2012-07-15 ENCOUNTER — Encounter (INDEPENDENT_AMBULATORY_CARE_PROVIDER_SITE_OTHER): Payer: Self-pay | Admitting: Surgery

## 2012-07-15 VITALS — BP 98/74 | HR 88 | Temp 97.9°F | Resp 16 | Ht 65.25 in | Wt 217.0 lb

## 2012-07-15 DIAGNOSIS — Z9889 Other specified postprocedural states: Secondary | ICD-10-CM

## 2012-07-15 MED ORDER — OXYCODONE-ACETAMINOPHEN 5-325 MG PO TABS: 2.0000 | ORAL_TABLET | ORAL | Status: DC | PRN

## 2012-07-15 NOTE — Patient Instructions (Signed)
Will ask [facility] to do dressing changes.

## 2012-07-15 NOTE — Telephone Encounter (Signed)
I called patient back to let her know I spoke with [facility] and they will increase her nursing visits to 3 times a week. I told her that they will be going out tomorrow for dressing change and that she can keep that same dressing until they come out either Monday or Tuesday. Then we would change her dressing on Wednesday at her appointment. I told her she may need to come in the office 1-2 times a week on the days [facility] does not go out to her house. I advised we would come up with a schedule when she is here on Wednesday.

## 2012-07-15 NOTE — Progress Notes (Signed)
Patient here after left breast debridement secondary to lumpectomy and poor wound healing. She is having problems with home health care and wet-to-dry dressings daily.  Exam: Left breast wound unpacked. The wound is 4 cm x 4 cm x 3 cm in size with good granulation tissue.  Impression: Status post left breast lumpectomy for breast cancer with postop radiation therapy and chronic left breast wound status post debridement  Plan:  Will ask a different home health agency for wound care since he requires daily wound care and is not capable of doing so. Return one week. Refill pain medicine.

## 2012-07-20 ENCOUNTER — Ambulatory Visit (INDEPENDENT_AMBULATORY_CARE_PROVIDER_SITE_OTHER): Payer: Federal, State, Local not specified - PPO | Admitting: Surgery

## 2012-07-20 ENCOUNTER — Encounter (INDEPENDENT_AMBULATORY_CARE_PROVIDER_SITE_OTHER): Payer: Self-pay | Admitting: Surgery

## 2012-07-20 ENCOUNTER — Telehealth (INDEPENDENT_AMBULATORY_CARE_PROVIDER_SITE_OTHER): Payer: Self-pay

## 2012-07-20 VITALS — BP 102/68 | HR 68 | Resp 16 | Ht 65.75 in | Wt 219.6 lb

## 2012-07-20 DIAGNOSIS — Z9889 Other specified postprocedural states: Secondary | ICD-10-CM

## 2012-07-20 NOTE — Patient Instructions (Signed)
Return 2 weeks.

## 2012-07-20 NOTE — Progress Notes (Signed)
Patient here after left breast debridement secondary to lumpectomy and poor wound healing. She is having problems with home health care and wet-to-dry dressings daily.  Exam: Left breast wound unpacked. The wound is 4 cm x 4 cm x 3 cm in size with good granulation tissue.  Impression: Status post left breast lumpectomy for breast cancer with postop radiation therapy and chronic left breast wound status post debridement  Plan: return 2 weeks.  May need to stop in office for wound care since  [facility] inconsistent

## 2012-07-20 NOTE — Telephone Encounter (Signed)
Pam at [facility] called stating pt was given warning of d/c. [facility] was unable to go to home for wd care on Saturday due to pt away from home running errands. They have policy that pt must be home bound for continued home care. Pam states they gave pt verbal warning that pt will be d/c from home care if she is not at home again for wd care unless she is going to MD office. Pam wanted Dr Luisa Hart to be aware.

## 2012-07-22 ENCOUNTER — Telehealth (INDEPENDENT_AMBULATORY_CARE_PROVIDER_SITE_OTHER): Payer: Self-pay | Admitting: General Surgery

## 2012-07-22 ENCOUNTER — Ambulatory Visit (INDEPENDENT_AMBULATORY_CARE_PROVIDER_SITE_OTHER): Payer: Federal, State, Local not specified - PPO | Admitting: General Surgery

## 2012-07-22 ENCOUNTER — Encounter (INDEPENDENT_AMBULATORY_CARE_PROVIDER_SITE_OTHER): Payer: Self-pay

## 2012-07-22 VITALS — BP 127/70 | HR 72 | Temp 97.9°F | Resp 14 | Ht 65.0 in | Wt 216.2 lb

## 2012-07-22 DIAGNOSIS — Z5189 Encounter for other specified aftercare: Secondary | ICD-10-CM

## 2012-07-22 NOTE — Telephone Encounter (Signed)
Patient came in today to have dressing changes on left breast. I packed the wound with two 4 X 4 in the breast and placed a dry gauze over the wound and the patient stated that there will be home health nurses coming out on Monday June 2 to start doing the dressing changes. And I remind the patient to take her apt with Dr Luisa Hart

## 2012-07-22 NOTE — Telephone Encounter (Signed)
Patient calling in stating home health is only coming 3 x weekly for dressing changes. I spoke to Caney and she states per Dr Luisa Hart this was okay. Patient states since Monday was a holiday they came out yesterday to change dressing. Nurse appt made for dressing change today. Nurses will start dressing changes Monday, Wednesday, Friday next week.

## 2012-07-26 ENCOUNTER — Ambulatory Visit (INDEPENDENT_AMBULATORY_CARE_PROVIDER_SITE_OTHER): Payer: Federal, State, Local not specified - PPO

## 2012-07-26 DIAGNOSIS — Z4801 Encounter for change or removal of surgical wound dressing: Secondary | ICD-10-CM

## 2012-07-26 NOTE — Progress Notes (Signed)
Pt is here today for wet to dry dressing change of her left breast.  Wound is clean, but still draining.  Wound redressed with moist gauze and covered with dry gauze.  Pt tolerated very well.

## 2012-07-27 ENCOUNTER — Telehealth: Payer: Self-pay | Admitting: Medical Oncology

## 2012-07-27 NOTE — Telephone Encounter (Signed)
2.5mg  daily disp 30.  Stay off Arimidex.  This is the plan we reviewed at her last appt   Thanks

## 2012-07-27 NOTE — Telephone Encounter (Signed)
Please call in Letrozole for patient.

## 2012-07-27 NOTE — Telephone Encounter (Signed)
Patient called to report that she has started to itch again since restarting Arimidex 05/16. Advised patient to stop Arimidex till further notice and will review with MD/NP and informed patient that I will call her with new instructions. Patient verbalized understanding.  sched appt 01/09/13 lab/NP

## 2012-07-28 ENCOUNTER — Encounter (INDEPENDENT_AMBULATORY_CARE_PROVIDER_SITE_OTHER): Payer: Federal, State, Local not specified - PPO

## 2012-07-28 ENCOUNTER — Telehealth: Payer: Self-pay | Admitting: Medical Oncology

## 2012-07-28 ENCOUNTER — Other Ambulatory Visit: Payer: Self-pay | Admitting: Adult Health

## 2012-07-28 MED ORDER — LETROZOLE 2.5 MG PO TABS: 2.5000 mg | ORAL_TABLET | Freq: Every day | ORAL | Status: DC

## 2012-07-28 NOTE — Telephone Encounter (Signed)
Per NP, informed patient to stop Arimidex and to start Letrozole. Prescription escribed to patient's listed pharmacy. Instructions and possible s/e explained to patient. Patient verbalized understanding. Encouraged pt to call office with any questions or concerns as well as to let us know how she is doing on the Letrozole.

## 2012-08-02 ENCOUNTER — Encounter (INDEPENDENT_AMBULATORY_CARE_PROVIDER_SITE_OTHER): Payer: Federal, State, Local not specified - PPO

## 2012-08-02 ENCOUNTER — Telehealth (INDEPENDENT_AMBULATORY_CARE_PROVIDER_SITE_OTHER): Payer: Self-pay | Admitting: General Surgery

## 2012-08-02 NOTE — Telephone Encounter (Signed)
Spoke with patient and explained to her that after speaking with Dr. Rosezena Sensor nurse, Marcelino Duster, that it has been explained to her by Dr. Luisa Hart before that this patient can go every other day in between packing changes.  She said she might come in for her nurse only appt on Thursday but if not then she will definitely be there on her appt on Friday with Dr. Luisa Hart.

## 2012-08-04 ENCOUNTER — Encounter (INDEPENDENT_AMBULATORY_CARE_PROVIDER_SITE_OTHER): Payer: Federal, State, Local not specified - PPO

## 2012-08-05 ENCOUNTER — Ambulatory Visit (INDEPENDENT_AMBULATORY_CARE_PROVIDER_SITE_OTHER): Payer: Federal, State, Local not specified - PPO | Admitting: Surgery

## 2012-08-05 ENCOUNTER — Encounter (INDEPENDENT_AMBULATORY_CARE_PROVIDER_SITE_OTHER): Payer: Self-pay | Admitting: Surgery

## 2012-08-05 VITALS — BP 102/68 | HR 84 | Temp 97.6°F | Resp 16 | Ht 65.25 in | Wt 213.6 lb

## 2012-08-05 DIAGNOSIS — Z9889 Other specified postprocedural states: Secondary | ICD-10-CM

## 2012-08-05 MED ORDER — OXYCODONE-ACETAMINOPHEN 5-325 MG PO TABS: 2.0000 | ORAL_TABLET | ORAL | Status: DC | PRN

## 2012-08-05 NOTE — Patient Instructions (Signed)
Return 3 weeks.  Wear a pad to help with absorbtion.

## 2012-08-05 NOTE — Progress Notes (Signed)
Patient here after left breast debridement secondary to lumpectomy and poor wound healing. She is having problems with home health care and wet-to-dry dressings daily.  Exam: Left breast wound unpacked. The wound is 3 cm x 4 cm x 3 cm in size with good granulation tissue.  Impression: Status post left breast lumpectomy for breast cancer with postop radiation therapy and chronic left breast wound status post debridement  Plan: return 2 weeks.  May need to stop in office for wound care since  [facility] inconsistent

## 2012-08-08 ENCOUNTER — Encounter (INDEPENDENT_AMBULATORY_CARE_PROVIDER_SITE_OTHER): Payer: Self-pay

## 2012-08-08 ENCOUNTER — Ambulatory Visit (INDEPENDENT_AMBULATORY_CARE_PROVIDER_SITE_OTHER): Payer: Federal, State, Local not specified - PPO

## 2012-08-08 VITALS — BP 118/78 | HR 71 | Temp 97.8°F | Resp 16 | Ht 65.25 in | Wt 212.8 lb

## 2012-08-08 DIAGNOSIS — Z4801 Encounter for change or removal of surgical wound dressing: Secondary | ICD-10-CM

## 2012-08-08 NOTE — Progress Notes (Signed)
Patient comes into office today for dressing change.  Removed gauze and placed wet to dry dressing on patient left breast wound.  Wound appears to be healing well, no redness around opening.  Patient denies having any fevers, patient reports that the wound continues to drain.  Dry gauze placed over wound.  Patient has nurse visit appointment on 08/09/12 @ 2:30 pm.

## 2012-08-09 ENCOUNTER — Ambulatory Visit (INDEPENDENT_AMBULATORY_CARE_PROVIDER_SITE_OTHER): Payer: Federal, State, Local not specified - PPO

## 2012-08-09 ENCOUNTER — Encounter (INDEPENDENT_AMBULATORY_CARE_PROVIDER_SITE_OTHER): Payer: Self-pay

## 2012-08-09 VITALS — BP 132/86 | HR 80 | Temp 97.0°F | Resp 16

## 2012-08-09 DIAGNOSIS — Z4801 Encounter for change or removal of surgical wound dressing: Secondary | ICD-10-CM

## 2012-08-09 NOTE — Progress Notes (Signed)
Patient comes into office today for wound check and dressing change.  Removed gauze and repack wound with wet to dry gauze.  Patient concerned with healing of her breast wound and would like to have Dr. Luisa Hart recheck to make sure breast tissue is healing properly and without infection.  Patient given a follow up appointment with Dr. Luisa Hart on 08/10/12 @ 4:15 pm.

## 2012-08-10 ENCOUNTER — Encounter (INDEPENDENT_AMBULATORY_CARE_PROVIDER_SITE_OTHER): Payer: Federal, State, Local not specified - PPO

## 2012-08-10 ENCOUNTER — Encounter (INDEPENDENT_AMBULATORY_CARE_PROVIDER_SITE_OTHER): Payer: Self-pay | Admitting: Surgery

## 2012-08-10 ENCOUNTER — Ambulatory Visit (INDEPENDENT_AMBULATORY_CARE_PROVIDER_SITE_OTHER): Payer: Federal, State, Local not specified - PPO | Admitting: Surgery

## 2012-08-10 VITALS — BP 118/68 | HR 80 | Temp 98.1°F | Resp 16 | Ht 65.25 in | Wt 216.0 lb

## 2012-08-10 DIAGNOSIS — Z9889 Other specified postprocedural states: Secondary | ICD-10-CM

## 2012-08-10 MED ORDER — HYDROXYZINE HCL 10 MG PO TABS: 10.0000 mg | ORAL_TABLET | Freq: Three times a day (TID) | ORAL | Status: DC | PRN

## 2012-08-10 NOTE — Patient Instructions (Signed)
Wound healing well.  Return 2 weeks.  Will see about wound vac.

## 2012-08-10 NOTE — Progress Notes (Signed)
Patient returns for wound check. The nurse noticed some yellow material the patient wished to have her rechecked. She is tired and  Itching  Temperature 98 vital signs otherwise stable  Left breast wound clean with good granulation tissue. There is exudate noted. This is minimal.t. No signs of infection.  Impression left breast with status post lumpectomy, radiation therapy and persistent seroma status post debridement  Plan: Patient may benefit from wound VAC. We'll begin to look at that. Return to clinic in 2 weeks. Given her radiation therapy the breasts there may be a benefit to the wound VAC in speeding up her healing.

## 2012-08-11 ENCOUNTER — Encounter (INDEPENDENT_AMBULATORY_CARE_PROVIDER_SITE_OTHER): Payer: Federal, State, Local not specified - PPO

## 2012-08-12 ENCOUNTER — Encounter (INDEPENDENT_AMBULATORY_CARE_PROVIDER_SITE_OTHER): Payer: Federal, State, Local not specified - PPO

## 2012-08-12 ENCOUNTER — Telehealth: Payer: Self-pay | Admitting: Medical Oncology

## 2012-08-12 NOTE — Telephone Encounter (Signed)
Patient called to report having itching on letrozole and she is going on vacation next week, concerned about this. Reviewed pt's concerns with Augustin Schooling, NP and per NP informed patient to stop letrozole and to call office when she returns from her vacation. Patient verbalized understanding and states she will call office July 1st. Patient with no further questions, knows to call office with any further questions or concerns.

## 2012-08-29 ENCOUNTER — Encounter (INDEPENDENT_AMBULATORY_CARE_PROVIDER_SITE_OTHER): Payer: Self-pay | Admitting: Surgery

## 2012-08-29 ENCOUNTER — Ambulatory Visit (INDEPENDENT_AMBULATORY_CARE_PROVIDER_SITE_OTHER): Payer: Federal, State, Local not specified - PPO | Admitting: Surgery

## 2012-08-29 VITALS — BP 138/86 | HR 68 | Temp 97.8°F | Resp 15 | Ht 65.25 in | Wt 214.2 lb

## 2012-08-29 DIAGNOSIS — Z9889 Other specified postprocedural states: Secondary | ICD-10-CM

## 2012-08-29 NOTE — Patient Instructions (Signed)
Return 1 week.

## 2012-08-29 NOTE — Progress Notes (Signed)
Patient returns for wound check. She is now and a wound VAC.    Temperature 98 vital signs otherwise stable  Left breast wound clean with good granulation tissue. There is  No exudate noted. . No signs of infection. Wound is 3 cm x 2 cm x 1 cm superior left breast.  Impression left breast with status post lumpectomy, radiation therapy and persistent seroma status post debridement  Plan: wound looks better with wound VAC. It was changed today. Return to clinic 1 week. May be able to discontinuing back in next 1-2 weeks.

## 2012-09-09 ENCOUNTER — Ambulatory Visit (INDEPENDENT_AMBULATORY_CARE_PROVIDER_SITE_OTHER): Payer: Federal, State, Local not specified - PPO | Admitting: Surgery

## 2012-09-09 ENCOUNTER — Encounter (INDEPENDENT_AMBULATORY_CARE_PROVIDER_SITE_OTHER): Payer: Self-pay | Admitting: Surgery

## 2012-09-09 VITALS — BP 124/78 | HR 92 | Temp 98.3°F | Resp 16 | Ht 65.25 in | Wt 214.4 lb

## 2012-09-09 DIAGNOSIS — Z9889 Other specified postprocedural states: Secondary | ICD-10-CM

## 2012-09-09 NOTE — Patient Instructions (Signed)
Wet to dry daily.  Return 1 month.  Stop wound vac.

## 2012-09-09 NOTE — Progress Notes (Signed)
Patient returns for wound check. She has had  a wound VAC.    Temperature 98 vital signs otherwise stable  Left breast wound clean with good granulation tissue. There is  No exudate noted. . No signs of infection. Wound is 1 cm x cm x 1 cm superior left breast.  Impression left breast with status post lumpectomy, radiation therapy and persistent seroma status post debridement  Plan: wound looks better with wound VAC. It was removed today since it was much smaller than previous visit. Start wet to dry dressings daily. Return to clinic one month.

## 2012-09-20 IMAGING — CR DG CHEST 2V
2 series · 2 of 2 positions shown · non-contrast
Comparison: None.

CLINICAL DATA: Preop for breast surgery

CHEST - 2 VIEW

[view not recorded (1 of 2)]
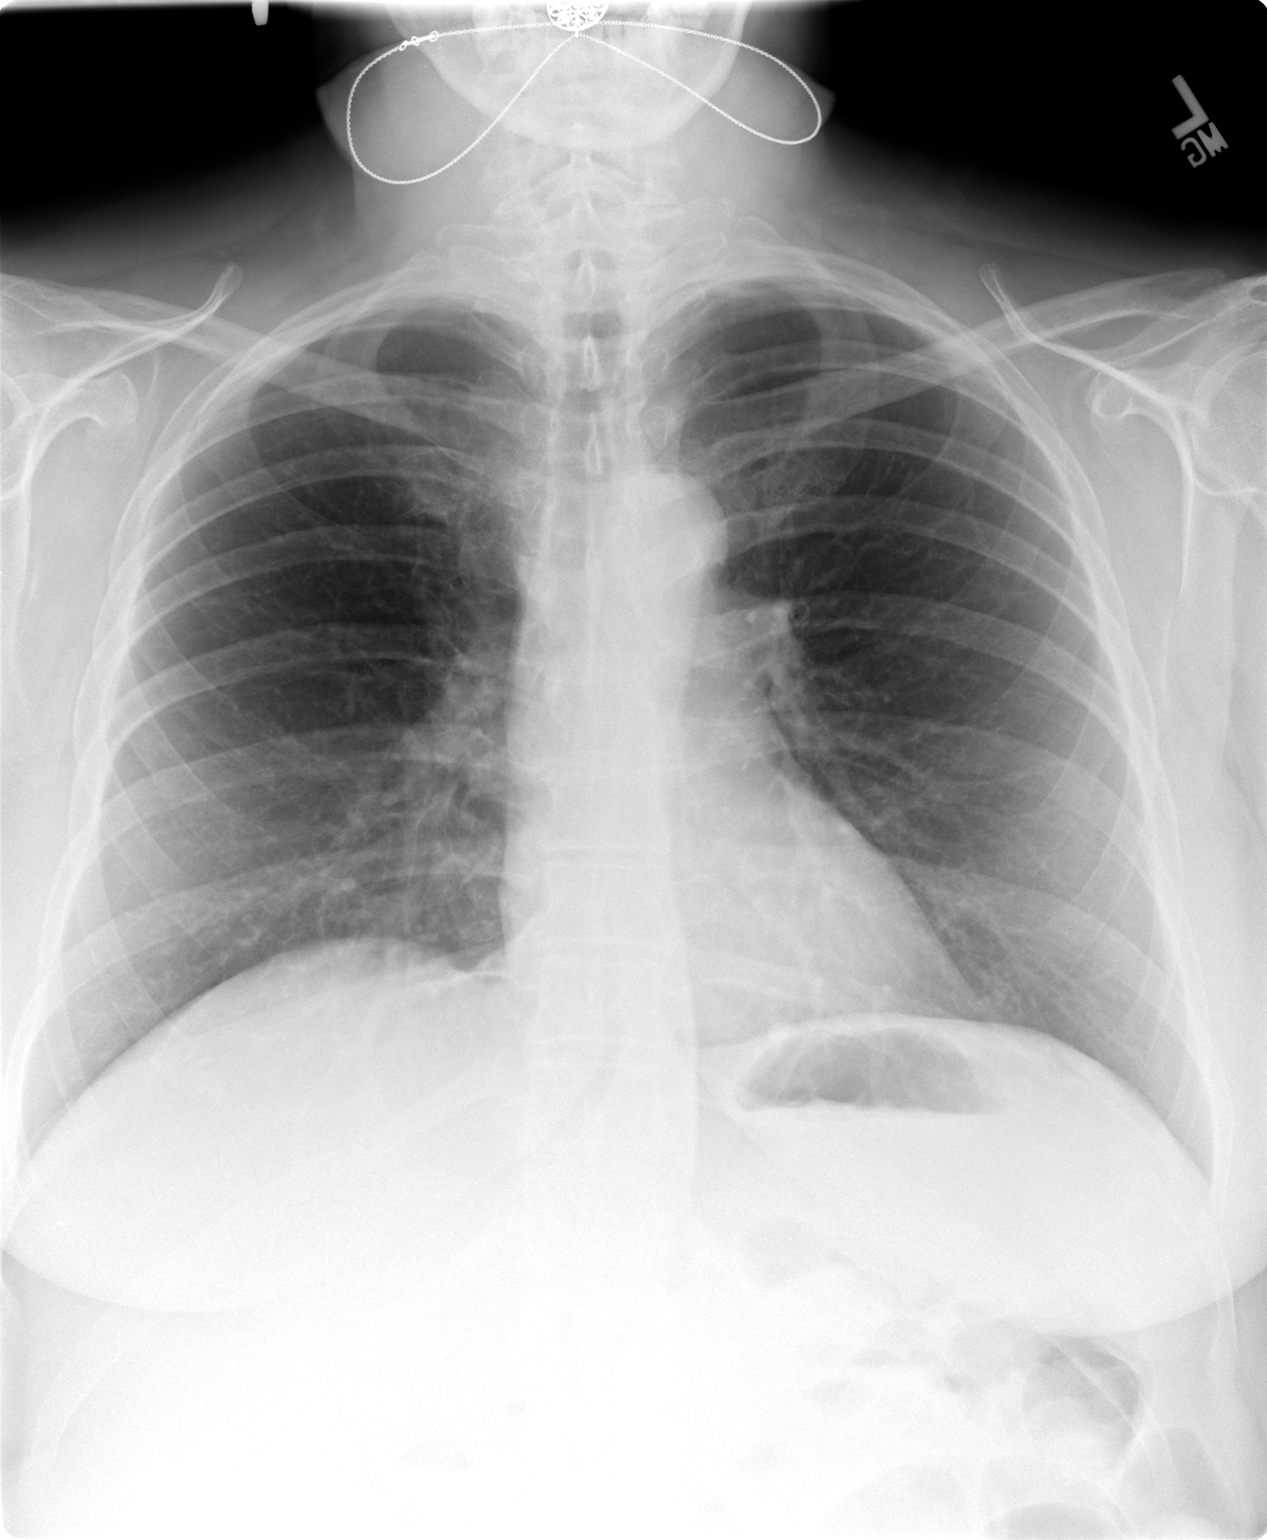

[view not recorded (2 of 2)]
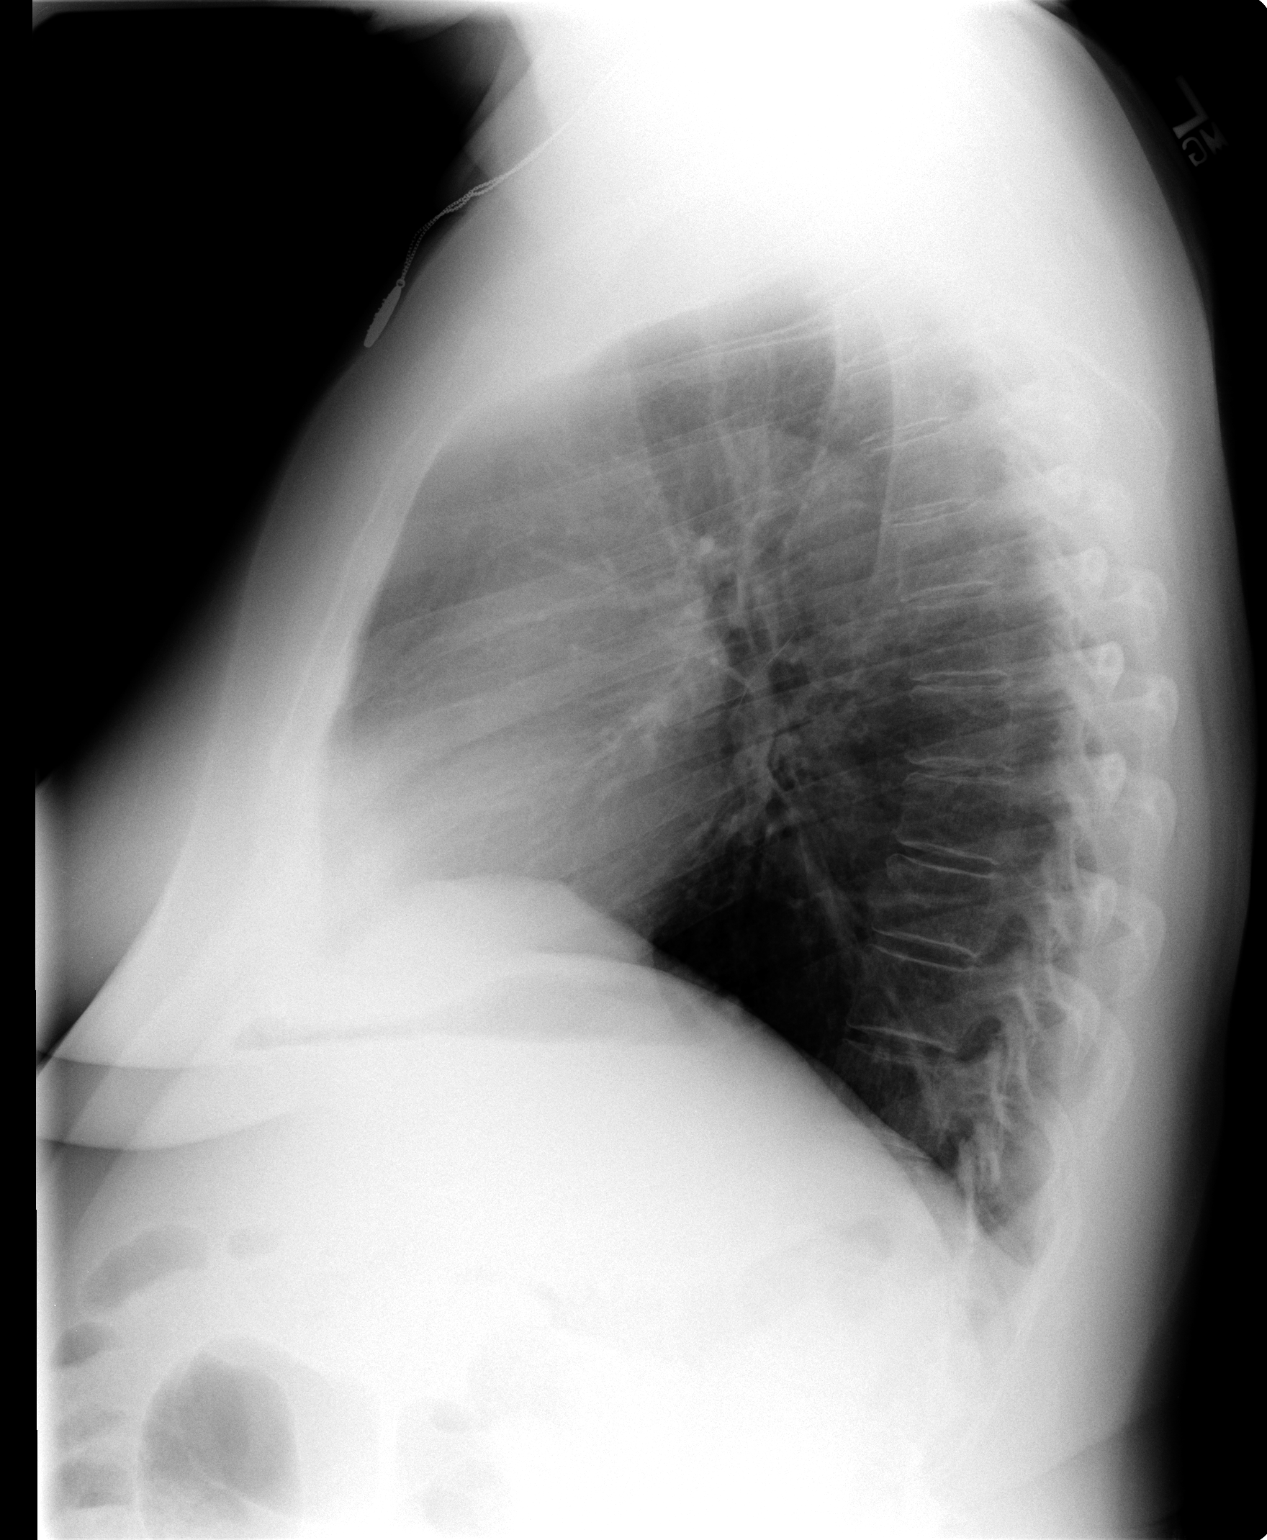

[2 of 2 positions shown; findings below may reference images not displayed]

FINDINGS: The lungs are clear.  Mediastinal contours appear normal.
The heart is within normal limits in size.  No bony abnormality is
seen.
IMPRESSION: No active lung disease.

## 2012-09-30 ENCOUNTER — Ambulatory Visit (INDEPENDENT_AMBULATORY_CARE_PROVIDER_SITE_OTHER): Payer: Federal, State, Local not specified - PPO | Admitting: Surgery

## 2012-09-30 ENCOUNTER — Encounter (INDEPENDENT_AMBULATORY_CARE_PROVIDER_SITE_OTHER): Payer: Self-pay | Admitting: Surgery

## 2012-09-30 VITALS — BP 112/80 | HR 72 | Temp 98.3°F | Resp 15 | Ht 65.25 in | Wt 212.2 lb

## 2012-09-30 DIAGNOSIS — Z9889 Other specified postprocedural states: Secondary | ICD-10-CM

## 2012-09-30 MED ORDER — HYDROXYZINE HCL 10 MG PO TABS: 10.0000 mg | ORAL_TABLET | Freq: Three times a day (TID) | ORAL | Status: DC | PRN

## 2012-09-30 NOTE — Patient Instructions (Signed)
Continue present care.  Can call next week if needed to have nurse check  Wound it if you wish. It is improving.

## 2012-09-30 NOTE — Progress Notes (Signed)
Patient returns for wound check. Doing wet to dry dressings.     Temperature 98 vital signs otherwise stable  Left breast wound clean with good granulation tissue. There is  Min  exudate noted. . No signs of infection. Wound is 1 cm x cm x 1 cm By 0.5 cm  superior left breast.  Impression left breast with status post lumpectomy, radiation therapy and persistent seroma status post debridement  Plan:Continue wet to dry dressing changes it is  Return to clinic three weeks.  LOOKS CLEAN.[name] Kitchen

## 2012-10-03 ENCOUNTER — Encounter (INDEPENDENT_AMBULATORY_CARE_PROVIDER_SITE_OTHER): Payer: Self-pay | Admitting: Surgery

## 2012-10-03 ENCOUNTER — Ambulatory Visit (INDEPENDENT_AMBULATORY_CARE_PROVIDER_SITE_OTHER): Payer: Federal, State, Local not specified - PPO | Admitting: Surgery

## 2012-10-03 ENCOUNTER — Encounter (INDEPENDENT_AMBULATORY_CARE_PROVIDER_SITE_OTHER): Payer: Federal, State, Local not specified - PPO | Admitting: Surgery

## 2012-10-03 VITALS — BP 120/80 | HR 68 | Temp 97.0°F | Resp 14 | Ht 65.25 in | Wt 216.4 lb

## 2012-10-03 DIAGNOSIS — Z9889 Other specified postprocedural states: Secondary | ICD-10-CM

## 2012-10-03 NOTE — Progress Notes (Signed)
Patient returns for wound check. Doing wet to dry dressings.     Temperature 98 vital signs otherwise stable  Left breast wound clean with good granulation tissue. There is  Min  exudate noted. . No signs of infection. Wound is 1 cm x cm x 1 cm By 0.5 cm  superior left breast.  Impression left breast with status post lumpectomy, radiation therapy and persistent seroma status post debridement  Plan:Continue wet to dry dressing changes it is  Return to clinic wed for dressing change.  LOOKS CLEAN.[name] Kitchen

## 2012-10-03 NOTE — Patient Instructions (Signed)
Return wed for dressing change

## 2012-10-04 ENCOUNTER — Telehealth: Payer: Self-pay | Admitting: *Deleted

## 2012-10-04 MED ORDER — EXEMESTANE 25 MG PO TABS: 25.0000 mg | ORAL_TABLET | Freq: Every day | ORAL | Status: DC

## 2012-10-04 NOTE — Telephone Encounter (Signed)
Rx  For Aromasin 25mg  escribed to pt pharmacy. Notified pt

## 2012-10-04 NOTE — Telephone Encounter (Signed)
Patient may begin aromasin 25 mg daily

## 2012-10-04 NOTE — Telephone Encounter (Signed)
Pt called [facility], states " I am back from [place] and need a different cancer medication. They took me off the other one because I was itching. Can they give me another one?" Will review with MD. Next appt 01/09/13

## 2012-10-05 ENCOUNTER — Encounter (INDEPENDENT_AMBULATORY_CARE_PROVIDER_SITE_OTHER): Payer: Self-pay

## 2012-10-05 ENCOUNTER — Ambulatory Visit (INDEPENDENT_AMBULATORY_CARE_PROVIDER_SITE_OTHER): Payer: Federal, State, Local not specified - PPO

## 2012-10-05 ENCOUNTER — Other Ambulatory Visit: Payer: Self-pay | Admitting: *Deleted

## 2012-10-05 VITALS — BP 142/88 | HR 68 | Temp 97.2°F | Resp 16 | Wt 214.8 lb

## 2012-10-05 DIAGNOSIS — Z5189 Encounter for other specified aftercare: Secondary | ICD-10-CM

## 2012-10-05 MED ORDER — EXEMESTANE 25 MG PO TABS: 25.0000 mg | ORAL_TABLET | Freq: Every day | ORAL | Status: DC

## 2012-10-05 NOTE — Progress Notes (Signed)
Patient comes into office today for wound check and dressing change/repacking of left breast from previous NL Lumpectomy.  Removed gauze, repacked wound with wet to dry gauze.  Patient tolerated well.  Patient reports no change in drainage since last seen.  Patient has nurse visit appointment scheduled for 10/07/12 for wound check and dressing change.

## 2012-10-07 ENCOUNTER — Encounter (INDEPENDENT_AMBULATORY_CARE_PROVIDER_SITE_OTHER): Payer: Federal, State, Local not specified - PPO

## 2012-10-10 ENCOUNTER — Ambulatory Visit (INDEPENDENT_AMBULATORY_CARE_PROVIDER_SITE_OTHER): Payer: Federal, State, Local not specified - PPO

## 2012-10-10 DIAGNOSIS — Z4800 Encounter for change or removal of nonsurgical wound dressing: Secondary | ICD-10-CM

## 2012-10-10 NOTE — Progress Notes (Signed)
Pt comes in today for packing change of left breast post NL Lumpectomy and  debridement 5/15.  Removed gauze.  Repacked with wet to dry dressing.  Pt states need to come back Wed.  appt for nurse only visit made for Wed at 1030.

## 2012-10-12 ENCOUNTER — Encounter (INDEPENDENT_AMBULATORY_CARE_PROVIDER_SITE_OTHER): Payer: Federal, State, Local not specified - PPO

## 2012-10-13 ENCOUNTER — Ambulatory Visit (INDEPENDENT_AMBULATORY_CARE_PROVIDER_SITE_OTHER): Payer: Federal, State, Local not specified - PPO | Admitting: General Surgery

## 2012-10-13 DIAGNOSIS — Z4800 Encounter for change or removal of nonsurgical wound dressing: Secondary | ICD-10-CM

## 2012-10-13 NOTE — Patient Instructions (Addendum)
Patient came in today for a dressing change on rt. breast. The 4x4 dressing was removed, then the wet dressing was removed. The wound was cleaned with some saline solution, there was no foul odor or drainage, wound looked pink and healthy. Patient stated there was no pain and wound did not feel warm to the touch. A new wet dressing was applied with a dry 4x4 over it. Patient tolerated the procedure well and will be back tomorrow for another dressing change. I informed patient that if she noticed any odor, pain, or drainage to call us. RA, CMA

## 2012-10-14 ENCOUNTER — Ambulatory Visit (INDEPENDENT_AMBULATORY_CARE_PROVIDER_SITE_OTHER): Payer: Federal, State, Local not specified - PPO

## 2012-10-14 DIAGNOSIS — Z4800 Encounter for change or removal of nonsurgical wound dressing: Secondary | ICD-10-CM

## 2012-10-14 NOTE — Progress Notes (Signed)
Patient came in for dressing change of left breast wound. I removed the guaze dressing. Wound looked good with zero signs of infection.  I packed wound with saline guaze and placed a dry 4x4 over wound. Patient will come in Monday for another dressing change.

## 2012-10-17 ENCOUNTER — Encounter (INDEPENDENT_AMBULATORY_CARE_PROVIDER_SITE_OTHER): Payer: Self-pay | Admitting: General Surgery

## 2012-10-17 ENCOUNTER — Ambulatory Visit (INDEPENDENT_AMBULATORY_CARE_PROVIDER_SITE_OTHER): Payer: Federal, State, Local not specified - PPO | Admitting: General Surgery

## 2012-10-17 VITALS — BP 122/72 | HR 86 | Temp 97.8°F | Resp 16 | Ht 65.25 in | Wt 218.4 lb

## 2012-10-17 DIAGNOSIS — Z4800 Encounter for change or removal of nonsurgical wound dressing: Secondary | ICD-10-CM

## 2012-10-17 NOTE — Progress Notes (Signed)
Patient comes in for her daily Left breast w-t-d dressing change..I packed about 1/2 of a 4x4 into the wound that is healing very nicely.[name] KitchenMarland KitchenZero redness, drainage, N/V, chills or fever..patient is coming in tomorrow 8/26 @12 :30 for nurse only

## 2012-10-18 ENCOUNTER — Ambulatory Visit (INDEPENDENT_AMBULATORY_CARE_PROVIDER_SITE_OTHER): Payer: Federal, State, Local not specified - PPO | Admitting: General Surgery

## 2012-10-18 ENCOUNTER — Encounter (INDEPENDENT_AMBULATORY_CARE_PROVIDER_SITE_OTHER): Payer: Self-pay | Admitting: General Surgery

## 2012-10-18 VITALS — BP 118/80 | HR 84 | Temp 98.6°F | Resp 16 | Ht 65.25 in | Wt 216.2 lb

## 2012-10-18 DIAGNOSIS — Z4800 Encounter for change or removal of nonsurgical wound dressing: Secondary | ICD-10-CM

## 2012-10-18 NOTE — Progress Notes (Signed)
Patient comes in for her daily Left breast w-t-d dressing change..I packed about 1/2 of a  4x4 into the wound that is healing very nicely.[name] KitchenMarland KitchenZero redness, drainage, N/V, chills or fever..patient tolerated very well and will come in tomorrow 8/27 @ 1:00 for nurse only visit

## 2012-10-19 ENCOUNTER — Ambulatory Visit (INDEPENDENT_AMBULATORY_CARE_PROVIDER_SITE_OTHER): Payer: Federal, State, Local not specified - PPO

## 2012-10-19 DIAGNOSIS — Z4800 Encounter for change or removal of nonsurgical wound dressing: Secondary | ICD-10-CM

## 2012-10-19 NOTE — Progress Notes (Signed)
Patient comes in for her daily Left breast w-t-d dressing change..I packed about 1/2 of a 4x4 into the wound that is healing very nicely.[name] KitchenMarland KitchenZero redness, drainage, N/V, chills or fever..patient tolerated very well and will come in tomorrow

## 2012-10-20 ENCOUNTER — Encounter (INDEPENDENT_AMBULATORY_CARE_PROVIDER_SITE_OTHER): Payer: Federal, State, Local not specified - PPO

## 2012-10-21 ENCOUNTER — Encounter (INDEPENDENT_AMBULATORY_CARE_PROVIDER_SITE_OTHER): Payer: Federal, State, Local not specified - PPO | Admitting: Surgery

## 2012-10-25 ENCOUNTER — Telehealth (INDEPENDENT_AMBULATORY_CARE_PROVIDER_SITE_OTHER): Payer: Self-pay

## 2012-10-25 NOTE — Telephone Encounter (Signed)
Pt called in stating her breast wound has been bleeding this weekend. It is not saturating her entire guaze. She is scheduled to see Dr Davina Poke tomorrow. She cannot come in today to have [place] look at it because she is out of town. She is on her way back now. I moved her appointment up to the morning tomorrow. I advised to keep guaze over area.

## 2012-10-26 ENCOUNTER — Encounter (INDEPENDENT_AMBULATORY_CARE_PROVIDER_SITE_OTHER): Payer: Self-pay | Admitting: Surgery

## 2012-10-26 ENCOUNTER — Ambulatory Visit (INDEPENDENT_AMBULATORY_CARE_PROVIDER_SITE_OTHER): Payer: Federal, State, Local not specified - PPO | Admitting: Surgery

## 2012-10-26 VITALS — BP 118/78 | HR 72 | Resp 14 | Ht 65.0 in | Wt 215.2 lb

## 2012-10-26 DIAGNOSIS — Z5189 Encounter for other specified aftercare: Secondary | ICD-10-CM

## 2012-10-26 NOTE — Progress Notes (Signed)
Patient returns for wound check. Doing wet to dry dressings.     Temperature 98 vital signs otherwise stable  Left breast wound clean with good granulation tissue. There is  Min  exudate noted. . No signs of infection. Wound is 1 cm x cm x 0.6 cm By 0.5 cm  superior left breast.  Impression left breast with status post lumpectomy, radiation therapy and persistent seroma status post debridement  Plan:Continue wet to dry dressing changes.  Return to clinic thursday for dressing change.  LOOKS CLEAN.Slow progress.  If no smaller in 1 month will refer to wound clinic.

## 2012-10-26 NOTE — Patient Instructions (Signed)
Return 1 month. Return for dressing change as needed.

## 2012-10-27 ENCOUNTER — Ambulatory Visit (INDEPENDENT_AMBULATORY_CARE_PROVIDER_SITE_OTHER): Payer: Federal, State, Local not specified - PPO

## 2012-10-27 DIAGNOSIS — Z4800 Encounter for change or removal of nonsurgical wound dressing: Secondary | ICD-10-CM

## 2012-10-27 NOTE — Progress Notes (Signed)
Patient came in for dressing change. Dry guaze was removed. I put a 2x2 wet guaze in patients wound and then placed dry 4x4 guaze over it. Patient will come in tomorrow for another dressing change.

## 2012-10-28 ENCOUNTER — Ambulatory Visit (INDEPENDENT_AMBULATORY_CARE_PROVIDER_SITE_OTHER): Payer: Federal, State, Local not specified - PPO

## 2012-10-28 DIAGNOSIS — Z4800 Encounter for change or removal of nonsurgical wound dressing: Secondary | ICD-10-CM

## 2012-10-28 NOTE — Progress Notes (Signed)
Patient comes in today for daily dressing change. Wound looks good. Dressing was removed without difficulty. Saline 2x2 guaze was [placedin wound and dry 4x4 placed over wet dressing. [patient will return next week.

## 2012-10-31 ENCOUNTER — Encounter (INDEPENDENT_AMBULATORY_CARE_PROVIDER_SITE_OTHER): Payer: Federal, State, Local not specified - PPO

## 2012-11-01 ENCOUNTER — Ambulatory Visit (INDEPENDENT_AMBULATORY_CARE_PROVIDER_SITE_OTHER): Payer: Federal, State, Local not specified - PPO

## 2012-11-01 DIAGNOSIS — Z4800 Encounter for change or removal of nonsurgical wound dressing: Secondary | ICD-10-CM

## 2012-11-01 NOTE — Progress Notes (Signed)
Patient comes in for dressing change. Dressing removed. Wound looks good. Wet to dry dressing applied to area. Patient will come in tomorrow for another wet to dry dressing.

## 2012-11-02 ENCOUNTER — Ambulatory Visit (INDEPENDENT_AMBULATORY_CARE_PROVIDER_SITE_OTHER): Payer: Federal, State, Local not specified - PPO

## 2012-11-02 DIAGNOSIS — Z4800 Encounter for change or removal of nonsurgical wound dressing: Secondary | ICD-10-CM

## 2012-11-02 NOTE — Progress Notes (Signed)
Pt is here today for her dressing change.  Bandage removed and wound repacked with sterile 2x2 moistened with saline.  Wound covered with dry gauze.  Pt requested an early morning appointment for tomorrow.

## 2012-11-03 ENCOUNTER — Ambulatory Visit (INDEPENDENT_AMBULATORY_CARE_PROVIDER_SITE_OTHER): Payer: Federal, State, Local not specified - PPO

## 2012-11-03 DIAGNOSIS — Z4800 Encounter for change or removal of nonsurgical wound dressing: Secondary | ICD-10-CM

## 2012-11-03 NOTE — Progress Notes (Signed)
Pt comes in for dressing change on her left breast.  I removed the existing dressing.  The wound looks good.  I then packed with wet gauze and covered with dry gauze.  Nurse only visit scheduled for tomorrow.

## 2012-11-04 ENCOUNTER — Encounter (INDEPENDENT_AMBULATORY_CARE_PROVIDER_SITE_OTHER): Payer: Self-pay

## 2012-11-04 ENCOUNTER — Ambulatory Visit (INDEPENDENT_AMBULATORY_CARE_PROVIDER_SITE_OTHER): Payer: Federal, State, Local not specified - PPO

## 2012-11-04 VITALS — HR 76 | Temp 98.0°F | Resp 18

## 2012-11-04 DIAGNOSIS — Z4800 Encounter for change or removal of nonsurgical wound dressing: Secondary | ICD-10-CM

## 2012-11-04 NOTE — Progress Notes (Signed)
Patient arrived for nurse only; Left breast no redness, drainage, temp noted; patient afebrile ; Removed packing ; cleansed area with cholera prep ; apllied 2x2 wet to dry ; Covered with 4x4 dressing; secured with tape; advised to call if redness,odor,drainage from area or temp 100.3 or greater.Patient verbalized understanding Nurse only scheduled for 9/15/ 14@230p 

## 2012-11-07 ENCOUNTER — Encounter (INDEPENDENT_AMBULATORY_CARE_PROVIDER_SITE_OTHER): Payer: Federal, State, Local not specified - PPO

## 2012-11-25 ENCOUNTER — Ambulatory Visit (INDEPENDENT_AMBULATORY_CARE_PROVIDER_SITE_OTHER): Payer: Federal, State, Local not specified - PPO | Admitting: Surgery

## 2012-11-25 ENCOUNTER — Encounter (INDEPENDENT_AMBULATORY_CARE_PROVIDER_SITE_OTHER): Payer: Self-pay | Admitting: Surgery

## 2012-11-25 VITALS — BP 130/80 | HR 80 | Temp 98.6°F | Resp 14 | Ht 65.25 in | Wt 218.0 lb

## 2012-11-25 DIAGNOSIS — Z5189 Encounter for other specified aftercare: Secondary | ICD-10-CM

## 2012-11-25 NOTE — Patient Instructions (Signed)
Will refer to wound clinic. Return 4 weeks.

## 2012-11-25 NOTE — Progress Notes (Signed)
Patient returns for wound check. Doing wet to dry dressings.     Temperature 98 vital signs otherwise stable  Left breast wound clean with good granulation tissue. There is  Min  exudate noted. . No signs of infection. Wound is 1 cm x cm x 0.6 cm By 0.4 cm  superior left breast.  Impression left breast with status post lumpectomy, radiation therapy and persistent seroma status post debridement  Plan:Continue wet to dry dressing changes.    LOOKS CLEAN.Slow progress.   will refer to wound clinic.  Return 1 month.

## 2012-11-28 ENCOUNTER — Telehealth (INDEPENDENT_AMBULATORY_CARE_PROVIDER_SITE_OTHER): Payer: Self-pay | Admitting: *Deleted

## 2012-11-28 NOTE — Telephone Encounter (Signed)
Called pt to inform her of her appt with Dr. Jimmey Ralph at [facility] located at [address] on 12/12/12 with an arrival time of 12:45pm.  Gave pt their phone number of [phone] if for any reason she needs to reschedule.

## 2012-12-01 ENCOUNTER — Other Ambulatory Visit: Payer: Self-pay | Admitting: Emergency Medicine

## 2012-12-01 NOTE — Progress Notes (Signed)
Patient called stating that she has been experiencing itching, headache and weight gain due to Exemestane. Patient requesting to be seen prior to scheduled appointment on 11/17.

## 2012-12-02 ENCOUNTER — Telehealth: Payer: Self-pay | Admitting: *Deleted

## 2012-12-02 NOTE — Telephone Encounter (Signed)
sw pt gv appt for 12/20/12 w/ labs@ 8:45 and ov@ 9:15am. Pt is aware....td

## 2012-12-12 ENCOUNTER — Other Ambulatory Visit (HOSPITAL_BASED_OUTPATIENT_CLINIC_OR_DEPARTMENT_OTHER): Payer: Self-pay | Admitting: General Surgery

## 2012-12-12 ENCOUNTER — Encounter (HOSPITAL_BASED_OUTPATIENT_CLINIC_OR_DEPARTMENT_OTHER): Payer: Federal, State, Local not specified - PPO | Attending: General Surgery

## 2012-12-12 ENCOUNTER — Ambulatory Visit (HOSPITAL_COMMUNITY)
Admission: RE | Admit: 2012-12-12 | Discharge: 2012-12-12 | Disposition: A | Discharge status: Home / Self Care | Payer: Federal, State, Local not specified - PPO | Source: Ambulatory Visit | Attending: General Surgery | Admitting: General Surgery

## 2012-12-12 DIAGNOSIS — Y842 Radiological procedure and radiotherapy as the cause of abnormal reaction of the patient, or of later complication, without mention of misadventure at the time of the procedure: Secondary | ICD-10-CM | POA: Insufficient documentation

## 2012-12-12 DIAGNOSIS — I10 Essential (primary) hypertension: Secondary | ICD-10-CM | POA: Insufficient documentation

## 2012-12-12 DIAGNOSIS — F3289 Other specified depressive episodes: Secondary | ICD-10-CM | POA: Insufficient documentation

## 2012-12-12 DIAGNOSIS — IMO0002 Reserved for concepts with insufficient information to code with codable children: Secondary | ICD-10-CM | POA: Insufficient documentation

## 2012-12-12 DIAGNOSIS — J45909 Unspecified asthma, uncomplicated: Secondary | ICD-10-CM | POA: Insufficient documentation

## 2012-12-12 DIAGNOSIS — C50919 Malignant neoplasm of unspecified site of unspecified female breast: Secondary | ICD-10-CM

## 2012-12-12 DIAGNOSIS — F329 Major depressive disorder, single episode, unspecified: Secondary | ICD-10-CM | POA: Insufficient documentation

## 2012-12-12 DIAGNOSIS — J8410 Pulmonary fibrosis, unspecified: Secondary | ICD-10-CM | POA: Insufficient documentation

## 2012-12-12 DIAGNOSIS — E785 Hyperlipidemia, unspecified: Secondary | ICD-10-CM | POA: Insufficient documentation

## 2012-12-12 DIAGNOSIS — Z923 Personal history of irradiation: Secondary | ICD-10-CM | POA: Insufficient documentation

## 2012-12-12 DIAGNOSIS — T66XXXS Radiation sickness, unspecified, sequela: Secondary | ICD-10-CM | POA: Insufficient documentation

## 2012-12-12 DIAGNOSIS — G43909 Migraine, unspecified, not intractable, without status migrainosus: Secondary | ICD-10-CM | POA: Insufficient documentation

## 2012-12-12 DIAGNOSIS — Z853 Personal history of malignant neoplasm of breast: Secondary | ICD-10-CM | POA: Insufficient documentation

## 2012-12-13 NOTE — Progress Notes (Signed)
[facility]  NAME:  Thalmann, Cruz                       ACCOUNT NO.:  MEDICAL RECORD NO.:  [id number]      DATE OF BIRTH:  Oct 15, 1950  PHYSICIAN:  Wayland Denis, DO       VISIT DATE:  12/12/2012                                  OFFICE VISIT   The patient is a 62 year old female, who is here for evaluation of her left breast area.  She underwent a lumpectomy over a year ago and then was radiated, then had an abscess in the area.  This was debrided.  She has been getting better over the last several months.  This was done in May but due to the radiation she is very slow to heal.  Her past medical history is positive for breast cancer, depression, asthma, migraines, hypertension, sinus problems, hearing loss in the right ear, and hyperlipidemia.  Her surgeries include a lumpectomy of the left breast, abscess removal of left breast, back surgery, tonsillectomy, and she has been radiated.  Her medications include Kenalog, Topamax, simvastatin, Requip, probiotics, Singulair, losartan, hydroxyzine, Lasix, Flovent, __________, duloxetine, doxepin, vitamin D, cetirizine, and aspirin.  No known drug allergies.  SOCIAL HISTORY:  She lost her husband recently but does have a little bit of family support, although she does take care of her aging mom. She does otherwise live alone.  On exam, she is alert, oriented, cooperative, not in any acute distress. She is pleasant.  Her pupils are equal.  Her extraocular muscles are intact.  No cervical lymphadenopathy.  Her breathing is unlabored.  Her heart rate is regular.  The wound is located on the left breast in the 10-11 o'clock position.  She does have some nipple areolar contraction. I do not feel any abscess.  No overt draining.  Due to the radiation history, this may give [place] some trouble healing.  She is a now a late effect radiation and may be a candidate for hyperbaric oxygen treatment as this could lead to pretty  severe loss of breast tissue, which we would like to prevent if at all possible.  Based on that, I did call Dr. Luisa Hart who did a lumpectomy and he is okay with me moving ahead with debridement if needed. I will know this in the next week or two.  We are also going to check a prealbumin to make sure she has got what she needs to heal and we will see her back in a week.  We will use silver on it in the meantime.     Wayland Denis, DO     CS/MEDQ  D:  12/12/2012  T:  12/13/2012  Job:  [id number]

## 2012-12-20 ENCOUNTER — Telehealth: Payer: Self-pay | Admitting: Oncology

## 2012-12-20 ENCOUNTER — Encounter: Payer: Federal, State, Local not specified - PPO | Admitting: Adult Health

## 2012-12-20 ENCOUNTER — Other Ambulatory Visit: Payer: Federal, State, Local not specified - PPO

## 2012-12-20 NOTE — Telephone Encounter (Signed)
, °

## 2012-12-20 NOTE — Progress Notes (Signed)
[facility]  NAME:  Katelyn Walsh, Katelyn Walsh NO.:  [id number]  MEDICAL RECORD NO.:  [id number]      DATE OF BIRTH:  02-13-1951  PHYSICIAN:  Wayland Denis, DO       VISIT DATE:  12/19/2012                                  OFFICE VISIT   The patient is a 62 year old female, who is here for a followup on her left breast ulcer secondary to radiation damage from treatment of a lumpectomy and breast cancer.  She has been using Silvercel with very good improvement over the past week.  Her pre-albumin came back at 23, and her chest x-ray was within normal limits.  There has been no change in her medications or social history.  PHYSICAL EXAMINATION:  On exam, she is alert, oriented, cooperative, not in any acute distress.  She is pleasant.  Her pupils are equal.  Her extraocular muscles are intact.  She does not have any cervical lymphadenopathy.  Her breathing is unlabored.  Her heart rate is regular.  Her abdomen is soft and nontender.  The wound has improved with less depth and width, and we will continue with the Silvercel and encourage protein intake.  We will also continue to work on getting her approved for HBO and surgery.     Wayland Denis, DO     CS/MEDQ  D:  12/19/2012  T:  12/20/2012  Job:  [id number]

## 2012-12-21 ENCOUNTER — Encounter: Payer: Self-pay | Admitting: Family

## 2012-12-21 ENCOUNTER — Telehealth: Payer: Self-pay | Admitting: Oncology

## 2012-12-21 ENCOUNTER — Other Ambulatory Visit (HOSPITAL_BASED_OUTPATIENT_CLINIC_OR_DEPARTMENT_OTHER): Payer: Federal, State, Local not specified - PPO | Admitting: Lab

## 2012-12-21 ENCOUNTER — Ambulatory Visit (HOSPITAL_BASED_OUTPATIENT_CLINIC_OR_DEPARTMENT_OTHER): Payer: Federal, State, Local not specified - PPO | Admitting: Family

## 2012-12-21 VITALS — BP 120/78 | HR 93 | Temp 98.3°F | Resp 19 | Ht 65.0 in | Wt 218.7 lb

## 2012-12-21 DIAGNOSIS — L0390 Cellulitis, unspecified: Secondary | ICD-10-CM

## 2012-12-21 DIAGNOSIS — C50219 Malignant neoplasm of upper-inner quadrant of unspecified female breast: Secondary | ICD-10-CM

## 2012-12-21 DIAGNOSIS — C50212 Malignant neoplasm of upper-inner quadrant of left female breast: Secondary | ICD-10-CM

## 2012-12-21 DIAGNOSIS — Z171 Estrogen receptor negative status [ER-]: Secondary | ICD-10-CM

## 2012-12-21 DIAGNOSIS — N61 Mastitis without abscess: Secondary | ICD-10-CM

## 2012-12-21 DIAGNOSIS — Z853 Personal history of malignant neoplasm of breast: Secondary | ICD-10-CM

## 2012-12-21 LAB — CBC WITH DIFFERENTIAL/PLATELET
BASO%: 0.6 % (ref 0.0–2.0)
Basophils Absolute: 0 10*3/uL (ref 0.0–0.1)
EOS%: 0.4 % (ref 0.0–7.0)
Eosinophils Absolute: 0 10*3/uL (ref 0.0–0.5)
HCT: 40 % (ref 34.8–46.6)
HGB: 13.3 g/dL (ref 11.6–15.9)
LYMPH%: 14.4 % (ref 14.0–49.7)
MCH: 29.6 pg (ref 25.1–34.0)
MCHC: 33.2 g/dL (ref 31.5–36.0)
MCV: 89.2 fL (ref 79.5–101.0)
MONO#: 0.7 10*3/uL (ref 0.1–0.9)
MONO%: 8.9 % (ref 0.0–14.0)
NEUT#: 6.1 10*3/uL (ref 1.5–6.5)
NEUT%: 75.7 % (ref 38.4–76.8)
Platelets: 188 10*3/uL (ref 145–400)
RBC: 4.48 10*6/uL (ref 3.70–5.45)
RDW: 13.9 % (ref 11.2–14.5)
WBC: 8.1 10*3/uL (ref 3.9–10.3)
lymph#: 1.2 10*3/uL (ref 0.9–3.3)

## 2012-12-21 LAB — COMPREHENSIVE METABOLIC PANEL (CC13)
ALT: 15 U/L (ref 0–55)
AST: 15 U/L (ref 5–34)
Albumin: 3.8 g/dL (ref 3.5–5.0)
Alkaline Phosphatase: 79 U/L (ref 40–150)
Anion Gap: 9 mEq/L (ref 3–11)
BUN: 11.5 mg/dL (ref 7.0–26.0)
CO2: 20 mEq/L — ABNORMAL LOW (ref 22–29)
Calcium: 9.4 mg/dL (ref 8.4–10.4)
Chloride: 110 mEq/L — ABNORMAL HIGH (ref 98–109)
Creatinine: 1 mg/dL (ref 0.6–1.1)
Glucose: 140 mg/dl (ref 70–140)
Potassium: 3.9 mEq/L (ref 3.5–5.1)
Sodium: 139 mEq/L (ref 136–145)
Total Bilirubin: 1.3 mg/dL — ABNORMAL HIGH (ref 0.20–1.20)
Total Protein: 7.7 g/dL (ref 6.4–8.3)

## 2012-12-21 MED ORDER — CEPHALEXIN 500 MG PO CAPS: 500.0000 mg | ORAL_CAPSULE | Freq: Three times a day (TID) | ORAL | Status: DC

## 2012-12-21 MED ORDER — TAMOXIFEN CITRATE 20 MG PO TABS: 20.0000 mg | ORAL_TABLET | Freq: Every day | ORAL | Status: DC

## 2012-12-21 MED ORDER — MUPIROCIN 2 % EX OINT: TOPICAL_OINTMENT | Freq: Three times a day (TID) | CUTANEOUS | Status: DC

## 2012-12-21 NOTE — Progress Notes (Addendum)
[facility]  [phone]) [phone] [phone]) [phone]  OFFICE PROGRESS NOTE  ID: Katelyn Walsh   DOB: 1950/03/10  MR#: [id number]  AYT#:[id number]   PCP: Alysia Penna, MD SU: Harriette Bouillon, MD RAD ONC: Lonie Peak, MD   DIAGNOSIS:  Katelyn Walsh is a 62 y.o.  female diagnosed with stage I invasive ductal carcinoma of the left breast in May 2013.   PRIOR THERAPY: #1 Patient originally seen in the multidisciplinary breast clinic after she had a left breast core needle biopsy on 06/25/2011 that showed a invasive ductal carcinoma at the 10 o'clock position.  She is status post left breast lumpectomy with sentinel node biopsy on 07/30/2011 with final pathology showing a stage I, pT1c, pN0, pMX, 1.8 cm invasive ductal carcinoma, estrogen receptor positive 100%, progesterone receptor negative, HER-2/neu negative, with KI 67 9%, grade 2.  Patient had 0/3 metastatic left axillary sentinel lymph nodes.  #2  Oncotype DX testing performed showed a recurrence score of 18. Giving her 11% risk of recurrence at 5 years with tamoxifen alone. This put her in the low-risk category with only antiestrogen therapy being recommended after radiation.  #3 Radiation therapy from 09/15/2011 through 10/28/2011.  #3 Adjuvant antiestrogen therapy with Arimidex 1 mg by mouth daily started in 12/2011 and discontinued in 03/2012 secondary to itching.  She started antiestrogen therapy with Letrozole in 07/2012 and this was discontinued in 09/2012 due to itching.  She started antiestrogen therapy with Exemestane in 09/2012 and this was discontinued in 11/2012 due to the patient complains of weight gain and migraine headaches.  Antiestrogen therapy will be switched to Tamoxifen today.  #4 Delayed left breast wound healing including wound debridement by Dr. Luisa Hart on 07/07/2012.  Patient now being seen at weekly at the [facility] by Dr. Kelly Splinter with additional breast surgery scheduled for  01/04/2013.   CURRENT THERAPY: Patient to start antiestrogen therapy with Tamoxifen 20 mg by mouth daily today.    INTERVAL HISTORY: Katelyn Walsh 62 y.o. female returns for followup of invasive ductal carcinoma of the left breast.  Since her last office visit on 07/08/2012, she states she stopped taking antiestrogen therapy with Exemestane a couple of days ago secondary to weight gain and severe migraine headaches.  She has continuing complaints of itching, hot flashes, fatigue and states she had a few episodes of loose stools without diarrhea.  She is seeing Dr. Kelly Splinter at the wound center for delayed wound healing of her left breast.  She states Dr. Kelly Splinter as planned additional breast surgery on 01/04/2013 including irrigation/debridement/and A-cell therapy.  The symptoms has complaints of left breast redness and warmth that developed within the past 24 hours.   Ms. Flanagin remains a participant in the [facility] research study.  She was asked to start daily walking (as tolerated) for her fatigue.  Her interval history is otherwise stable and unremarkable.   MEDICAL HISTORY: Past Medical History  Diagnosis Date  . Back pain   . Migraines   . Seasonal allergies   . Hypertension   . Asthma     allergies  . Sleep apnea     has not used CPAP in two years  . Depression   . Rash and nonspecific skin eruption 08/13/2011  . Mastitis in female 08/13/2011  . S/P radiation therapy 09/15/11 - 10/28/11    Left Breast  . Hot flashes   . History of blood transfusion   . Cancer     left breast-last radiation 9'13  .  Open breast wound     left -none draining-uses iodoform gauze daily.    ALLERGIES:   No Known Allergies   MEDICATIONS:  Current Outpatient Prescriptions  Medication Sig Dispense Refill  . aspirin EC 81 MG tablet Take 81 mg by mouth daily.      . cetirizine (ZYRTEC) 10 MG tablet Take 10 mg by mouth daily.      . cholecalciferol (VITAMIN D) 1000 UNITS tablet Take 2,000 Units by mouth daily.        [name] Kitchen doxepin (SINEQUAN) 50 MG capsule Take 50 mg by mouth at bedtime.       [name] Kitchen escitalopram (LEXAPRO) 10 MG tablet Take 10 mg by mouth daily.       . fluticasone (FLOVENT HFA) 110 MCG/ACT inhaler Inhale 1 puff into the lungs 2 (two) times daily.  1 Inhaler  12  . furosemide (LASIX) 20 MG tablet Take 20 mg by mouth as needed.       . hydrOXYzine (ATARAX/VISTARIL) 10 MG tablet Take 1 tablet (10 mg total) by mouth 3 (three) times daily as needed for itching.  30 tablet  0  . losartan (COZAAR) 100 MG tablet Take 100 mg by mouth daily.       . montelukast (SINGULAIR) 10 MG tablet Take 10 mg by mouth at bedtime.      . Probiotic Product (ALIGN) 4 MG CAPS Take 1 capsule by mouth daily with breakfast.       . REQUIP 1 MG tablet Take 1 mg by mouth daily as needed. For restless leg      . simvastatin (ZOCOR) 40 MG tablet Take 40 mg by mouth at bedtime.      . topiramate (TOPAMAX) 100 MG tablet Take 100 mg by mouth 2 (two) times daily.      [name] Kitchen triamcinolone cream (KENALOG) 0.1 % Apply 1 application topically as needed.       . zolpidem (AMBIEN) 10 MG tablet Take 10 mg by mouth at bedtime as needed.       . cephALEXin (KEFLEX) 500 MG capsule Take 1 capsule (500 mg total) by mouth 3 (three) times daily.  21 capsule  0  . mupirocin ointment (BACTROBAN) 2 % Apply topically 3 (three) times daily.  22 g  0  . tamoxifen (NOLVADEX) 20 MG tablet Take 1 tablet (20 mg total) by mouth daily.  30 tablet  12   No current facility-administered medications for this visit.    SURGICAL HISTORY:  Past Surgical History  Procedure Laterality Date  . Tonsillectomy      as child  . Colonoscopy      polyps removed in past  . Mastectomy partial / lumpectomy w/ axillary lymphadenectomy  07/31/11    Left Breast : Invasive Ductal Carcinoma: 0/3  Nodes Negative: ER 100%, PR 0%, Her 2Neu Negative  . Back surgery  2005    microdiscectomy lumbar 5-6  . Breast surgery    . Breast biopsy Left 07/07/2012    Procedure: Left Breast  Debridement;  Surgeon: Maisie Fus A. Cornett, MD;  Location: [facility];  Service: General;  Laterality: Left;  Left Breast Debridement  . Wound debridement  07/07/2012    REVIEW OF SYSTEMS:  Pertinent items are noted in HPI.   A 10 point review of systems was completed and is negative except as noted above.  Ms. Sharol Harness denies any other symptomatology including  fever or chills, vision changes, swollen glands, cough or shortness of breath, chest pain or  discomfort, nausea, vomiting, diarrhea, constipation, current change in urinary or bowel habits, arthralgias/myalgias, unusual bleeding/bruising or any other symptomatology.   Health Maintenance Mammogram: 06/23/2011 Colonoscopy: 01/2012 Bone Density Scan: 01/14/2012  Pap Smear: 06/18/2011 Eye Exam: 03/29/2012 Vitamin D Level: 12/2011 Lipid Panel: 06/2011   PHYSICAL EXAMINATION: BP 120/78  Pulse 93  Temp(Src) 98.3 F (36.8 C) (Oral)  Resp 19  Ht 5\' 5"  (1.651 m)  Wt 218 lb 11.2 oz (99.202 kg)  BMI 36.39 kg/m2   ECOG PERFORMANCE STATUS: 1 - Symptomatic but completely ambulatory  General appearance: Alert, cooperative, well nourished, no apparent distress Head: Normocephalic, without obvious abnormality, atraumatic Eyes: Conjunctivae/corneas clear, PERRLA, EOMI Nose: Nares, septum and mucosa are normal, no drainage or sinus tenderness Neck: No adenopathy, supple, symmetrical, trachea midline, no tenderness Resp: Clear to auscultation bilaterally, no wheezes/rales/rhonchi Cardio: Regular rate and rhythm, S1, S2 normal, no murmur, click, rub or gallop, no edema Breasts:  Left breast is partially covered with occlusive dressing, left breast has surgical scars, left breast has a large area of erythema that is warm to the touch, bilateral axillary fullness, bilateral glandular breast tissue noted GI: Soft, distended, non-tender, hypoactive bowel sounds, excessive habitus Skin: Wound under occlusive dressing on left breast, erythematous left  breast, no cyanosis  M/S:  Atraumatic, normal strength in all extremities, normal range of motion, no clubbing  Lymph nodes: Cervical, supraclavicular, and axillary nodes normal Neurologic: Grossly normal, cranial nerves II through XII intact, alert and oriented x 3 Psych: Appropriate affect    LABORATORY DATA: Lab Results  Component Value Date   WBC 8.1 12/21/2012   HGB 13.3 12/21/2012   HCT 40.0 12/21/2012   MCV 89.2 12/21/2012   PLT 188 12/21/2012      Chemistry      Component Value Date/Time   NA 139 12/21/2012 1313   NA 138 07/04/2012 1335   K 3.9 12/21/2012 1313   K 3.5 07/04/2012 1335   CL 104 07/04/2012 1335   CL 107 01/18/2012 1314   CO2 20* 12/21/2012 1313   CO2 23 07/04/2012 1335   BUN 11.5 12/21/2012 1313   BUN 13 07/04/2012 1335   CREATININE 1.0 12/21/2012 1313   CREATININE 0.96 07/04/2012 1335      Component Value Date/Time   CALCIUM 9.4 12/21/2012 1313   CALCIUM 9.4 07/04/2012 1335   ALKPHOS 79 12/21/2012 1313   ALKPHOS 94 07/28/2011 1615   AST 15 12/21/2012 1313   AST 27 07/28/2011 1615   ALT 15 12/21/2012 1313   ALT 29 07/28/2011 1615   BILITOT 1.30* 12/21/2012 1313   BILITOT 0.9 07/28/2011 1615      RADIOGRAPHIC STUDIES: Dg Chest 2 View 12/12/2012   CLINICAL DATA:  Breast cancer  EXAM: CHEST  2 VIEW  COMPARISON:  01/05/2012  FINDINGS: Cardiomediastinal silhouette is stable. No acute infiltrate or pleural effusion. No pulmonary edema. Calcified granuloma left base is stable. Bony thorax is unremarkable.  IMPRESSION: No active cardiopulmonary disease.   Electronically Signed   By: Natasha Mead M.D.   On: 12/12/2012 10:52     ASSESSMENT:   LANNIE HEAPS is a 62 y.o. female with:  #1 Stage IA invasive ductal carcinoma, estrogen receptor positive, progesterone receptor negative,  HER-2/neu negative with Ki-67 9%.  She is status post left breast lumpectomy with sentinel node biopsy on 07/30/2011 with final pathology showing a stage I, pT1c, pN0, pMX, 1.8 cm invasive  ductal carcinoma, estrogen receptor positive 100%, progesterone receptor negative, HER-2/neu negative, with  KI 67 9%, grade 2.  Patient had 0/3 metastatic left axillary sentinel lymph nodes.  #2  Oncotype DX testing performed showed a recurrence score of 18. Giving her 11% risk of recurrence at 5 years with tamoxifen alone. This put her in the low-risk category with only antiestrogen therapy being recommended after radiation.  #3 Radiation therapy from 09/15/2011 through 10/28/2011.  #3 Adjuvant antiestrogen therapy with Arimidex 1 mg by mouth daily started in 12/2011 and discontinued in 03/2012 secondary to itching.  She started antiestrogen therapy with Letrozole in 07/2012 and this was discontinued in 09/2012 due to itching.  She started antiestrogen therapy with Exemestane in 09/2012 and this was discontinued in 11/2012 due to the patient complains of weight gain and migraine headaches.  Antiestrogen therapy was changed to Tamoxifen on 12/21/2012.  #4 Delayed left breast wound healing including wound debridement by Dr. Luisa Hart on 07/07/2012.  Patient now being seen at weekly at the Wound Center by Dr. Kelly Splinter with additional breast surgery scheduled for 01/04/2013.  Currently with left breast cellulitis.   PLAN:  #1 An electronic prescription for Tamoxifen 20 mg by mouth daily #30 with 12 refills was sent to the patient's pharmacy.  The patient was given verbal and written instructions about tamoxifen and possible side effects from taking tamoxifen including blood clots, uterine cancer, and cataract formation.  The patient stated she understood.  #2 For left breast cellulitis, an electronic prescription for Keflex 500 mg by mouth 3 times a day #21 was sent to the patient's pharmacy.  An electronic prescription was also sent to her pharmacy for Bactroban ointment to be applied 3 times a day to avoid MRSA and triamcinolone cream one application topically when necessary for left breast cellulitis and  associated discomfort.   #3 We plan to see Ms. Dishman again in 6 months, at which time we will check laboratories of CBC, CMP, and vitamin D level.  We asked Ms. Obie to show her left breast cellulitis to Dr. Kelly Splinter next week during her weekly Wound Center visit for followup.  She agreed to do so.  All questions answered.  Ms. Caruthers was encouraged to contact us in the interim with any questions, concerns, or problems.   Larina Bras, NP-C 12/21/2012  5:52 PM   ATTENDING'S ATTESTATION:  I personally reviewed patient's chart, examined patient myself, formulated the treatment plan as followed.    Patient is seen in followup today. She originally was started on Arimidex with curative intent unfortunately she could not tolerate that. We therefore changed her to letrozole which she could not tolerate either due to pruritus. We'll also attempted to give her eczema stent which she again could not tolerate because of migraine headaches and weight gain. She was eventually started on tamoxifen today at 20 mg daily. I discussed risks benefits and side effects. I discussed rationale for being on tamoxifen. Patient understands and gets full consent to start tamoxifen.  The patient will be seen back in 6 months time for followup.  Drue Second, MD Medical/Oncology [facility] [phone] (beeper) [phone] (Office)  12/28/2012, 8:57 AM

## 2012-12-21 NOTE — Progress Notes (Signed)
This encounter was created in error - please disregard.

## 2012-12-21 NOTE — Patient Instructions (Addendum)
Please contact us at [phone] if you have any questions or concerns.  Please continue to do well and enjoy life!!!  Get plenty of rest, drink plenty of water, exercise daily (walking), eat a balanced diet.  Continue taking vitamin D3 daily.   Complete monthly self-breast examinations.  Have a clinical breast exam by a physician every year.  Have your mammogram completed every year.  Finish entire course of antibiotic therapy.  Results for orders placed in visit on 12/21/12 (from the past 24 hour(s))  CBC WITH DIFFERENTIAL     Status: None   Collection Time    12/21/12  1:13 PM      Result Value Range   WBC 8.1  3.9 - 10.3 10e3/uL   NEUT# 6.1  1.5 - 6.5 10e3/uL   HGB 13.3  11.6 - 15.9 g/dL   HCT 57.8  46.9 - 62.9 %   Platelets 188  145 - 400 10e3/uL   MCV 89.2  79.5 - 101.0 fL   MCH 29.6  25.1 - 34.0 pg   MCHC 33.2  31.5 - 36.0 g/dL   RBC 5.28  4.13 - 2.44 10e6/uL   RDW 13.9  11.2 - 14.5 %   lymph# 1.2  0.9 - 3.3 10e3/uL   MONO# 0.7  0.1 - 0.9 10e3/uL   Eosinophils Absolute 0.0  0.0 - 0.5 10e3/uL   Basophils Absolute 0.0  0.0 - 0.1 10e3/uL   NEUT% 75.7  38.4 - 76.8 %   LYMPH% 14.4  14.0 - 49.7 %   MONO% 8.9  0.0 - 14.0 %   EOS% 0.4  0.0 - 7.0 %   BASO% 0.6  0.0 - 2.0 %   Narrative:    Performed At:  [facility]               [address]               [place], [place] [address]  COMPREHENSIVE METABOLIC PANEL (CC13)     Status: Abnormal   Collection Time    12/21/12  1:13 PM      Result Value Range   Sodium 139  136 - 145 mEq/L   Potassium 3.9  3.5 - 5.1 mEq/L   Chloride 110 (*) 98 - 109 mEq/L   CO2 20 (*) 22 - 29 mEq/L   Glucose 140  70 - 140 mg/dl   BUN 25.3  7.0 - 66.4 mg/dL   Creatinine 1.0  0.6 - 1.1 mg/dL   Total Bilirubin 4.03 (*) 0.20 - 1.20 mg/dL   Alkaline Phosphatase 79  40 - 150 U/L   AST 15  5 - 34 U/L   ALT 15  0 - 55 U/L   Total Protein 7.7  6.4 - 8.3 g/dL   Albumin 3.8  3.5 - 5.0 g/dL   Calcium 9.4  8.4 - 47.4 mg/dL   Anion  Gap 9  3 - 11 mEq/L   Narrative:    Performed At:  [facility]               [address]               [place], [place] [address]   Tamoxifen oral tablet What is this medicine? TAMOXIFEN (ta MOX i fen) blocks the effects of estrogen. It is commonly used to treat breast cancer. It is also used to decrease the chance of breast cancer coming back in women who  have received treatment for the disease. It may also help prevent breast cancer in women who have a high risk of developing breast cancer. This medicine may be used for other purposes; ask your health care provider or pharmacist if you have questions. What should I tell my health care provider before I take this medicine? They need to know if you have any of these conditions: -blood clots -blood disease -cataracts or impaired eyesight -endometriosis -high calcium levels -high cholesterol -irregular menstrual cycles -liver disease -stroke -uterine fibroids -an unusual or allergic reaction to tamoxifen, other medicines, foods, dyes, or preservatives -pregnant or trying to get pregnant -breast-feeding How should I use this medicine? Take this medicine by mouth with a glass of water. Follow the directions on the prescription label. You can take it with or without food. Take your medicine at regular intervals. Do not take your medicine more often than directed. Do not stop taking except on your doctor's advice. A special MedGuide will be given to you by the pharmacist with each prescription and refill. Be sure to read this information carefully each time. Talk to your pediatrician regarding the use of this medicine in children. While this drug may be prescribed for selected conditions, precautions do apply. Overdosage: If you think you have taken too much of this medicine contact a poison control center or emergency room at once. NOTE: This medicine is only for you. Do not share this medicine with others. What if I miss a  dose? If you miss a dose, take it as soon as you can. If it is almost time for your next dose, take only that dose. Do not take double or extra doses. What may interact with this medicine? -aminoglutethimide -bromocriptine -chemotherapy drugs -female hormones, like estrogens and birth control pills -letrozole -medroxyprogesterone -phenobarbital -rifampin -warfarin This list may not describe all possible interactions. Give your health care provider a list of all the medicines, herbs, non-prescription drugs, or dietary supplements you use. Also tell them if you smoke, drink alcohol, or use illegal drugs. Some items may interact with your medicine. What should I watch for while using this medicine? Visit your doctor or health care professional for regular checks on your progress. You will need regular pelvic exams, breast exams, and mammograms. If you are taking this medicine to reduce your risk of getting breast cancer, you should know that this medicine does not prevent all types of breast cancer. If breast cancer or other problems occur, there is no guarantee that it will be found at an early stage. Do not become pregnant while taking this medicine or for 2 months after stopping this medicine. Stop taking this medicine if you get pregnant or think you are pregnant and contact your doctor. This medicine may harm your unborn baby. Women who can possibly become pregnant should use birth control methods that do not use hormones during tamoxifen treatment and for 2 months after therapy has stopped. Talk with your health care provider for birth control advice. Do not breast feed while taking this medicine. What side effects may I notice from receiving this medicine? Side effects that you should report to your doctor or health care professional as soon as possible: -changes in vision (blurred vision) -changes in your menstrual cycle -difficulty breathing or shortness of breath -difficulty walking or  talking -new breast lumps -numbness -pelvic pain or pressure -redness, blistering, peeling or loosening of the skin, including inside the mouth -skin rash or itching (hives) -sudden chest pain -swelling of  lips, face, or tongue -swelling, pain or tenderness in your calf or leg -unusual bruising or bleeding -vaginal discharge that is bloody, brown, or rust -weakness -yellowing of the whites of the eyes or skin Side effects that usually do not require medical attention (report to your doctor or health care professional if they continue or are bothersome): -fatigue -hair loss, although uncommon and is usually mild -headache -hot flashes -impotence (in men) -nausea, vomiting (mild) -vaginal discharge (white or clear) This list may not describe all possible side effects. Call your doctor for medical advice about side effects. You may report side effects to FDA at 1-800-FDA-1088. Where should I keep my medicine? Keep out of the reach of children. Store at room temperature between 20 and 25 degrees C (68 and 77 degrees F). Protect from light. Keep container tightly closed. Throw away any unused medicine after the expiration date. NOTE: This sheet is a summary. It may not cover all possible information. If you have questions about this medicine, talk to your doctor, pharmacist, or health care provider.  2012, [employer]vier/[employer] Standard. (10/27/2007 12:01:56 PM)

## 2012-12-21 NOTE — Telephone Encounter (Signed)
, °

## 2012-12-26 ENCOUNTER — Encounter (INDEPENDENT_AMBULATORY_CARE_PROVIDER_SITE_OTHER): Payer: Self-pay | Admitting: Surgery

## 2012-12-26 ENCOUNTER — Encounter (HOSPITAL_BASED_OUTPATIENT_CLINIC_OR_DEPARTMENT_OTHER): Payer: Federal, State, Local not specified - PPO | Attending: Plastic Surgery

## 2012-12-26 ENCOUNTER — Ambulatory Visit (INDEPENDENT_AMBULATORY_CARE_PROVIDER_SITE_OTHER): Payer: Federal, State, Local not specified - PPO | Admitting: Surgery

## 2012-12-26 VITALS — BP 122/82 | HR 72 | Resp 16 | Ht 65.2 in | Wt 221.2 lb

## 2012-12-26 DIAGNOSIS — Z853 Personal history of malignant neoplasm of breast: Secondary | ICD-10-CM | POA: Insufficient documentation

## 2012-12-26 DIAGNOSIS — T66XXXA Radiation sickness, unspecified, initial encounter: Secondary | ICD-10-CM | POA: Insufficient documentation

## 2012-12-26 DIAGNOSIS — Z923 Personal history of irradiation: Secondary | ICD-10-CM | POA: Insufficient documentation

## 2012-12-26 DIAGNOSIS — L98499 Non-pressure chronic ulcer of skin of other sites with unspecified severity: Secondary | ICD-10-CM | POA: Insufficient documentation

## 2012-12-26 DIAGNOSIS — Y842 Radiological procedure and radiotherapy as the cause of abnormal reaction of the patient, or of later complication, without mention of misadventure at the time of the procedure: Secondary | ICD-10-CM | POA: Insufficient documentation

## 2012-12-26 DIAGNOSIS — Z5189 Encounter for other specified aftercare: Secondary | ICD-10-CM

## 2012-12-26 NOTE — Progress Notes (Signed)
Patient returns for wound check. Doing wet to dry dressings.  Being seen by wound clinic andis scheduled for debridement and A cell application by Dr Kelly Splinter.  Undergoing hyperbaric oxygen therapy.    Temperature 98 vital signs otherwise stable  Left breast wound clean with good granulation tissue. There is  Min  exudate noted. . No signs of infection. Wound is 1 cm x cm x 0.6 cm By 0.4 cm  superior left breast.  Impression left breast with status post lumpectomy, radiation therapy and persistent seroma status post debridement  Plan:Continue wet to dry dressing changes.    LOOKS CLEAN.Slow progress.   Follow up  With  wound clinic.  Return 1 month.

## 2012-12-26 NOTE — Patient Instructions (Signed)
Follow up 1 month

## 2012-12-27 ENCOUNTER — Encounter (HOSPITAL_BASED_OUTPATIENT_CLINIC_OR_DEPARTMENT_OTHER): Payer: Self-pay | Admitting: *Deleted

## 2012-12-28 ENCOUNTER — Encounter (HOSPITAL_BASED_OUTPATIENT_CLINIC_OR_DEPARTMENT_OTHER): Payer: Self-pay | Admitting: *Deleted

## 2012-12-28 NOTE — Progress Notes (Signed)
NPO AFTER MN WITH EXCEPTION CLEAR LIQUIDS UNTIL 0900 (NO CREAM/ MILK PRODUCTS). ARRIVE AT 1330. CURRENT LAB RESULTS IN CHART AND EPIC. NEEDS EKG. WILL TAKE TOPAMAX AND COZAAR AM DOS W/ SIPS OF WATER.

## 2012-12-29 ENCOUNTER — Other Ambulatory Visit: Payer: Self-pay

## 2012-12-30 ENCOUNTER — Other Ambulatory Visit: Payer: Self-pay | Admitting: Plastic Surgery

## 2012-12-30 DIAGNOSIS — S21002S Unspecified open wound of left breast, sequela: Secondary | ICD-10-CM

## 2013-01-04 ENCOUNTER — Ambulatory Visit (HOSPITAL_BASED_OUTPATIENT_CLINIC_OR_DEPARTMENT_OTHER)
Admission: RE | Admit: 2013-01-04 | Payer: Federal, State, Local not specified - PPO | Source: Ambulatory Visit | Admitting: Plastic Surgery

## 2013-01-04 HISTORY — DX: Obstructive sleep apnea (adult) (pediatric): G47.33

## 2013-01-04 HISTORY — DX: Malignant neoplasm of unspecified site of unspecified female breast: C50.919

## 2013-01-04 HISTORY — DX: Hyperlipidemia, unspecified: E78.5

## 2013-01-04 SURGERY — IRRIGATION AND DEBRIDEMENT WOUND
Anesthesia: General | Site: Breast | Laterality: Left

## 2013-01-09 ENCOUNTER — Other Ambulatory Visit: Payer: Federal, State, Local not specified - PPO | Admitting: Lab

## 2013-01-09 ENCOUNTER — Ambulatory Visit: Payer: Federal, State, Local not specified - PPO | Admitting: Adult Health

## 2013-01-13 NOTE — Progress Notes (Signed)
[facility]  NAME:  Katelyn Walsh, Katelyn Walsh                       ACCOUNT NO.:  MEDICAL RECORD NO.:  [id number]      DATE OF BIRTH:  Aug 15, 1950  PHYSICIAN:  Wayland Denis, DO       VISIT DATE:  01/09/2013                                  OFFICE VISIT   The patient is a 62 year old female, who has been dealing with a left breast wound secondary to radiation and after treatment for breast cancer.  We have been using silver alginate and she has been showing excellent improvement with that in the HBO.  We will continue.  There is no change in her antibiotics or social history.  PHYSICAL EXAMINATION:  On exam, she is alert, oriented, cooperative, not in any acute distress.  She is pleasant.  Pupils are equal.  Extraocular muscles are intact.  She does not have any cervical lymphadenopathy. Her breathing is unlabored and her heart rate is regular.  Her abdomen is soft and nontender.  __________ we will do collagen and continue HBO for another week to see this to complete closure and have her follow up in 1 week.     Wayland Denis, DO     CS/MEDQ  D:  01/09/2013  T:  01/10/2013  Job:  [id number]

## 2013-01-16 NOTE — Progress Notes (Signed)
[facility]  NAME:  Katelyn Walsh, Katelyn Walsh NO.:  [id number]  MEDICAL RECORD NO.:  [id number]      DATE OF BIRTH:  06-14-1950  PHYSICIAN:  Wayland Denis, DO       VISIT DATE:  01/16/2013                                  OFFICE VISIT   HISTORY OF PRESENT ILLNESS:  The patient is a 62 year old female who is here for followup on her left breast ulcer.  She is doing extremely well and has healed up with just a little bit of a scab.  There has been no change in her medications or social history.  PHYSICAL EXAMINATION:  GENERAL:  On exam, she is alert, oriented, cooperative, not in any acute distress.  She is very pleasant. HEENT:  Pupils are equal.  Extraocular muscles are intact. NECK:  No cervical lymphadenopathy. LUNGS:  Her breathing is unlabored. HEART:  Her heart rate is regular.  She is doing extremely well with the hyperbaric treatment and is planning on continuing that until she takes her trip to [place] in which case she will miss several and then we will see how she does when she comes back.  We will continue with Mepilex for protection and multivitamin, vitamin C, and zinc, and she may shower and get the area wet.     Wayland Denis, DO     CS/MEDQ  D:  01/16/2013  T:  01/16/2013  Job:  [id number]

## 2013-01-23 ENCOUNTER — Encounter (HOSPITAL_BASED_OUTPATIENT_CLINIC_OR_DEPARTMENT_OTHER): Payer: Federal, State, Local not specified - PPO | Attending: Plastic Surgery

## 2013-01-23 DIAGNOSIS — Y842 Radiological procedure and radiotherapy as the cause of abnormal reaction of the patient, or of later complication, without mention of misadventure at the time of the procedure: Secondary | ICD-10-CM | POA: Insufficient documentation

## 2013-01-23 DIAGNOSIS — T66XXXA Radiation sickness, unspecified, initial encounter: Secondary | ICD-10-CM | POA: Insufficient documentation

## 2013-01-23 DIAGNOSIS — N61 Mastitis without abscess: Secondary | ICD-10-CM | POA: Insufficient documentation

## 2013-01-31 ENCOUNTER — Encounter (INDEPENDENT_AMBULATORY_CARE_PROVIDER_SITE_OTHER): Payer: Self-pay | Admitting: Surgery

## 2013-01-31 ENCOUNTER — Ambulatory Visit (INDEPENDENT_AMBULATORY_CARE_PROVIDER_SITE_OTHER): Payer: Federal, State, Local not specified - PPO | Admitting: Surgery

## 2013-01-31 ENCOUNTER — Other Ambulatory Visit (INDEPENDENT_AMBULATORY_CARE_PROVIDER_SITE_OTHER): Payer: Self-pay | Admitting: Surgery

## 2013-01-31 VITALS — BP 126/82 | HR 72 | Resp 14 | Ht 65.2 in | Wt 222.2 lb

## 2013-01-31 DIAGNOSIS — Z5189 Encounter for other specified aftercare: Secondary | ICD-10-CM

## 2013-01-31 DIAGNOSIS — Z9889 Other specified postprocedural states: Secondary | ICD-10-CM

## 2013-01-31 DIAGNOSIS — Z853 Personal history of malignant neoplasm of breast: Secondary | ICD-10-CM

## 2013-01-31 NOTE — Patient Instructions (Signed)
Ok to get area wet.  Return 3 months.

## 2013-01-31 NOTE — Progress Notes (Signed)
Patient returns for wound check. Doing wet to dry dressings.  Being seen by wound clinic.  Undergoing hyperbaric oxygen therapy.  Debridement cancelled since she is doing well with hyperbaric oxygen therapy.    Temperature 98 vital signs otherwise stable  Left breast wound clean and is closed. . No signs of infection.  Impression left breast with status post lumpectomy, radiation therapy and persistent seroma status post debridement  Plan:wound closed. RTC 3 months

## 2013-02-03 NOTE — Progress Notes (Signed)
[facility]  NAME:  Walsh, Katelyn                       ACCOUNT NO.:  MEDICAL RECORD NO.:  [id number]      DATE OF BIRTH:  1950-09-15  PHYSICIAN:  Wayland Denis, DO       VISIT DATE:  01/30/2013                                  OFFICE VISIT   HISTORY OF PRESENT ILLNESS:  The patient is a 62 year old female, who is here for followup on her left breast radiation ulcer.  She is doing much better.  It has healed the area.  She wants to continue with HBO.  There has been no change in her medications or social history.  REVIEW OF SYSTEMS:  Negative.  PHYSICAL EXAMINATION:  GENERAL:  On exam, she is alert, oriented, cooperative, not in any acute distress. HEENT:  Pupils are equal.  Extraocular muscles are intact. LUNGS:  Breathing is unlabored. HEART:  Rate is regular.  Her wound is healed.  She has covered dry dressing on there now and we have encouraged her to clean with Dial soap, and follow up with the HBO when she gets back from her [place] trip.     Wayland Denis, DO     CS/MEDQ  D:  01/30/2013  T:  01/31/2013  Job:  [id number]

## 2013-02-08 ENCOUNTER — Other Ambulatory Visit (INDEPENDENT_AMBULATORY_CARE_PROVIDER_SITE_OTHER): Payer: Self-pay | Admitting: Surgery

## 2013-02-08 ENCOUNTER — Other Ambulatory Visit: Payer: Self-pay

## 2013-02-08 DIAGNOSIS — Z853 Personal history of malignant neoplasm of breast: Secondary | ICD-10-CM

## 2013-02-08 DIAGNOSIS — Z9889 Other specified postprocedural states: Secondary | ICD-10-CM

## 2013-02-13 ENCOUNTER — Ambulatory Visit
Admission: RE | Admit: 2013-02-13 | Discharge: 2013-02-13 | Disposition: A | Discharge status: Home / Self Care | Payer: Federal, State, Local not specified - PPO | Source: Ambulatory Visit | Attending: Surgery | Admitting: Surgery

## 2013-02-13 DIAGNOSIS — Z9889 Other specified postprocedural states: Secondary | ICD-10-CM

## 2013-02-13 DIAGNOSIS — Z853 Personal history of malignant neoplasm of breast: Secondary | ICD-10-CM

## 2013-03-17 ENCOUNTER — Other Ambulatory Visit (INDEPENDENT_AMBULATORY_CARE_PROVIDER_SITE_OTHER): Payer: Self-pay | Admitting: *Deleted

## 2013-03-17 MED ORDER — UNABLE TO FIND: Status: DC

## 2013-04-03 ENCOUNTER — Ambulatory Visit (INDEPENDENT_AMBULATORY_CARE_PROVIDER_SITE_OTHER): Payer: Federal, State, Local not specified - PPO | Admitting: Surgery

## 2013-04-04 ENCOUNTER — Other Ambulatory Visit (INDEPENDENT_AMBULATORY_CARE_PROVIDER_SITE_OTHER): Payer: Self-pay | Admitting: *Deleted

## 2013-04-04 MED ORDER — UNABLE TO FIND: Status: DC

## 2013-04-27 ENCOUNTER — Telehealth: Payer: Self-pay | Admitting: Oncology

## 2013-04-27 NOTE — Telephone Encounter (Signed)
, °

## 2013-05-05 ENCOUNTER — Ambulatory Visit (INDEPENDENT_AMBULATORY_CARE_PROVIDER_SITE_OTHER): Payer: Federal, State, Local not specified - PPO | Admitting: Surgery

## 2013-05-22 ENCOUNTER — Ambulatory Visit (INDEPENDENT_AMBULATORY_CARE_PROVIDER_SITE_OTHER): Payer: Federal, State, Local not specified - PPO | Admitting: Surgery

## 2013-06-05 ENCOUNTER — Telehealth: Payer: Self-pay | Admitting: Adult Health

## 2013-06-05 NOTE — Telephone Encounter (Signed)
, °

## 2013-06-13 ENCOUNTER — Encounter (INDEPENDENT_AMBULATORY_CARE_PROVIDER_SITE_OTHER): Payer: Self-pay | Admitting: Surgery

## 2013-06-13 ENCOUNTER — Ambulatory Visit (INDEPENDENT_AMBULATORY_CARE_PROVIDER_SITE_OTHER): Payer: Federal, State, Local not specified - PPO | Admitting: Surgery

## 2013-06-13 VITALS — BP 126/82 | HR 73 | Temp 97.6°F | Resp 16 | Ht 65.0 in | Wt 219.4 lb

## 2013-06-13 DIAGNOSIS — Z853 Personal history of malignant neoplasm of breast: Secondary | ICD-10-CM

## 2013-06-13 MED ORDER — HYDROXYZINE HCL 10 MG PO TABS
10.0000 mg | ORAL_TABLET | Freq: Three times a day (TID) | ORAL | Status: DC | PRN
Start: 2013-06-13 — End: 2014-07-31

## 2013-06-13 NOTE — Patient Instructions (Signed)
Return 6 months

## 2013-06-13 NOTE — Progress Notes (Signed)
NAME: Katelyn Walsh       DOB: 1950-07-21           DATE: 06/13/2013        MRN: [id number]  CC:   Chief Complaint  Patient presents with  . Follow-up    Katelyn Walsh is a 63 y.o.[name] Kitchenfemale with history of stage I invasive ductal carcinoma of the left breast diagnosed in May 2013. Pt has been follow for chronic left breast wound treated with debridement in 2014 and very slow healing.   PRIOR THERAPY:  #1 patient was originally seen in the multidisciplinary breast clinic after she had a ultrasound core needle biopsy performed that showed a invasive ductal carcinoma of the left breast at the 10:00 position. Since then she has gone on to have a lumpectomy with sentinel node biopsy. Her final pathology Showed a 1.8 cm invasive ductal carcinoma that was ER positive 100% PR Negative HER-2/neu negative grade 2 with KI 67 9%. Patient had 3 sentinel nodes that were negative for metastatic disease.  #2 patient had an Oncotype DX testing performed that showed the recurrence score Of 18. Giving her 11% risk of recurrence at 5 years with tamoxifen alone. This put her in the low-risk category. And does only antiestrogen therapy is being recommended after radiation.  #3 patient is status post radiation therapy.  #3 patient began Arimidex 1 mg daily adjuvantly.   Her wound has healed but she does not like the way it looks. Has some occasional pain.      PFSH: She has had no significant changes since the last visit here.  ROS: There have been no significant changes since the last visit here  EXAM:  VS: BP 126/82  Pulse 73  Temp(Src) 97.6 F (36.4 C)  Resp 16  Ht '5\' 5"'  (1.651 m)  Wt 219 lb 6.4 oz (99.519 kg)  BMI 36.51 kg/m2  General: The patient is alert, oriented, generally healthy appearing, NAD. Mood and affect are normal.  Breasts:  left breast has sunk at lumpectomy site no new masses. Right breast normal   Lymphatics: She has no axillary or supraclavicular adenopathy on either  side.  Extremities: Full ROM of the surgical side with no lymphedema noted.  Data Reviewed: CLINICAL DATA: Status post left lumpectomy and radiation therapy  for breast cancer in June of 2013 with subsequent breast infection  and re-excision.  EXAM:  DIGITAL DIAGNOSTIC BILATERAL MAMMOGRAM WITH CAD  COMPARISON: Previous examinations.  ACR Breast Density Category c: The breast tissue is heterogeneously  dense, which may obscure small masses.  FINDINGS:  Interval post lumpectomy and postradiation changes on the left.  Interval 4 mm group of irregular microcalcifications at the  posterior aspect of the lumpectomy site. No findings on the right  suspicious for malignancy.  Mammographic images were processed with CAD.  IMPRESSION:  Interval left breast post lumpectomy changes with an interval 4 mm  group of calcifications most likely representing developing fat  necrosis. No evidence of malignancy on the right.  RECOMMENDATION:  Left diagnostic mammogram in 6 months.  I have discussed the findings and recommendations with the patient.  Results were also provided in writing at the conclusion of the  visit. If applicable, a reminder letter will be sent to the patient  regarding the next appointment.  BI-RADS CATEGORY 3: Probably benign finding(s) - short interval  follow-up suggested.  Electronically Signed  By: Enrique Sack M.D.  On: 02/13/2013 15:53   Impression: Doing well, with no  evidence of recurrent cancer or new cancer  Plan: Will continue to follow up on an biannual basis here.

## 2013-06-29 ENCOUNTER — Other Ambulatory Visit: Payer: Federal, State, Local not specified - PPO

## 2013-06-29 ENCOUNTER — Ambulatory Visit: Payer: Federal, State, Local not specified - PPO | Admitting: Adult Health

## 2013-06-29 ENCOUNTER — Ambulatory Visit: Payer: Federal, State, Local not specified - PPO | Admitting: Oncology

## 2013-08-07 ENCOUNTER — Other Ambulatory Visit (INDEPENDENT_AMBULATORY_CARE_PROVIDER_SITE_OTHER): Payer: Self-pay | Admitting: Surgery

## 2013-08-07 DIAGNOSIS — R921 Mammographic calcification found on diagnostic imaging of breast: Secondary | ICD-10-CM

## 2013-08-07 DIAGNOSIS — Z853 Personal history of malignant neoplasm of breast: Secondary | ICD-10-CM

## 2013-08-15 ENCOUNTER — Telehealth: Payer: Self-pay | Admitting: Hematology and Oncology

## 2013-08-15 NOTE — Telephone Encounter (Signed)
, °

## 2013-08-17 ENCOUNTER — Ambulatory Visit
Admission: RE | Admit: 2013-08-17 | Discharge: 2013-08-17 | Disposition: A | Discharge status: Home / Self Care | Payer: Federal, State, Local not specified - PPO | Source: Ambulatory Visit | Attending: Surgery | Admitting: Surgery

## 2013-08-17 DIAGNOSIS — Z853 Personal history of malignant neoplasm of breast: Secondary | ICD-10-CM

## 2013-08-17 DIAGNOSIS — R921 Mammographic calcification found on diagnostic imaging of breast: Secondary | ICD-10-CM

## 2013-08-28 ENCOUNTER — Ambulatory Visit: Payer: Federal, State, Local not specified - PPO | Admitting: Oncology

## 2013-08-28 ENCOUNTER — Other Ambulatory Visit: Payer: Federal, State, Local not specified - PPO

## 2013-11-21 ENCOUNTER — Telehealth: Payer: Self-pay | Admitting: Hematology and Oncology

## 2013-11-21 NOTE — Telephone Encounter (Signed)
, °

## 2013-11-27 ENCOUNTER — Ambulatory Visit: Payer: Federal, State, Local not specified - PPO | Admitting: Hematology and Oncology

## 2013-11-27 ENCOUNTER — Other Ambulatory Visit: Payer: Federal, State, Local not specified - PPO

## 2013-12-18 ENCOUNTER — Other Ambulatory Visit: Payer: Self-pay | Admitting: *Deleted

## 2013-12-18 DIAGNOSIS — C50219 Malignant neoplasm of upper-inner quadrant of unspecified female breast: Secondary | ICD-10-CM

## 2013-12-19 ENCOUNTER — Other Ambulatory Visit: Payer: Federal, State, Local not specified - PPO

## 2013-12-19 ENCOUNTER — Telehealth: Payer: Self-pay | Admitting: Hematology and Oncology

## 2013-12-19 ENCOUNTER — Ambulatory Visit: Payer: Federal, State, Local not specified - PPO | Admitting: Hematology and Oncology

## 2013-12-19 NOTE — Telephone Encounter (Signed)
, °

## 2014-01-09 ENCOUNTER — Ambulatory Visit (HOSPITAL_BASED_OUTPATIENT_CLINIC_OR_DEPARTMENT_OTHER): Payer: Federal, State, Local not specified - PPO | Admitting: Hematology and Oncology

## 2014-01-09 ENCOUNTER — Other Ambulatory Visit (HOSPITAL_BASED_OUTPATIENT_CLINIC_OR_DEPARTMENT_OTHER): Payer: Federal, State, Local not specified - PPO

## 2014-01-09 ENCOUNTER — Telehealth: Payer: Self-pay | Admitting: Hematology and Oncology

## 2014-01-09 DIAGNOSIS — C50219 Malignant neoplasm of upper-inner quadrant of unspecified female breast: Secondary | ICD-10-CM

## 2014-01-09 DIAGNOSIS — C50212 Malignant neoplasm of upper-inner quadrant of left female breast: Secondary | ICD-10-CM

## 2014-01-09 DIAGNOSIS — Z170 Estrogen receptor positive status [ER+]: Secondary | ICD-10-CM

## 2014-01-09 LAB — CBC WITH DIFFERENTIAL/PLATELET
BASO%: 0.4 % (ref 0.0–2.0)
Basophils Absolute: 0 10*3/uL (ref 0.0–0.1)
EOS%: 3.2 % (ref 0.0–7.0)
Eosinophils Absolute: 0.2 10*3/uL (ref 0.0–0.5)
HCT: 41.1 % (ref 34.8–46.6)
HGB: 13.4 g/dL (ref 11.6–15.9)
LYMPH%: 27.8 % (ref 14.0–49.7)
MCH: 30.5 pg (ref 25.1–34.0)
MCHC: 32.6 g/dL (ref 31.5–36.0)
MCV: 93.6 fL (ref 79.5–101.0)
MONO#: 0.3 10*3/uL (ref 0.1–0.9)
MONO%: 7 % (ref 0.0–14.0)
NEUT#: 2.9 10*3/uL (ref 1.5–6.5)
NEUT%: 61.6 % (ref 38.4–76.8)
Platelets: 176 10*3/uL (ref 145–400)
RBC: 4.39 10*6/uL (ref 3.70–5.45)
RDW: 12.9 % (ref 11.2–14.5)
WBC: 4.7 10*3/uL (ref 3.9–10.3)
lymph#: 1.3 10*3/uL (ref 0.9–3.3)

## 2014-01-09 LAB — COMPREHENSIVE METABOLIC PANEL (CC13)
ALT: 23 U/L (ref 0–55)
AST: 23 U/L (ref 5–34)
Albumin: 4 g/dL (ref 3.5–5.0)
Alkaline Phosphatase: 78 U/L (ref 40–150)
Anion Gap: 9 mEq/L (ref 3–11)
BUN: 13.4 mg/dL (ref 7.0–26.0)
CO2: 20 mEq/L — ABNORMAL LOW (ref 22–29)
Calcium: 9 mg/dL (ref 8.4–10.4)
Chloride: 113 mEq/L — ABNORMAL HIGH (ref 98–109)
Creatinine: 1.1 mg/dL (ref 0.6–1.1)
Glucose: 128 mg/dl (ref 70–140)
Potassium: 4 mEq/L (ref 3.5–5.1)
Sodium: 142 mEq/L (ref 136–145)
Total Bilirubin: 0.72 mg/dL (ref 0.20–1.20)
Total Protein: 6.9 g/dL (ref 6.4–8.3)

## 2014-01-09 MED ORDER — ANASTROZOLE 1 MG PO TABS: 1.0000 mg | ORAL_TABLET | Freq: Every day | ORAL | Status: DC

## 2014-01-09 NOTE — Progress Notes (Signed)
Patient Care Team: Velna Hatchet, MD as PCP - General (Internal Medicine) Irven Shelling, MD as Attending Physician (Internal Medicine) Harlin Heys, MD as Referring Physician [facility] Medicine)  DIAGNOSIS: Cancer of upper-inner quadrant of female breast   Staging form: Breast, AJCC 7th Edition     Clinical: Stage IA (T1c, N0, cM0) - Unsigned       Staging comments: Staged in Breast Conference 5.8.13      Pathologic: No stage assigned - Unsigned  stage I invasive ductal carcinoma of the left breast diagnosed in May 2013.  PRIOR THERAPY:  #1 patient was originally seen in the multidisciplinary breast clinic after she had a ultrasound core needle biopsy performed that showed a invasive ductal carcinoma of the left breast at the 10:00 position. Since then she has gone on to have a lumpectomy with sentinel node biopsy. Her final pathology Showed a 1.8 cm invasive ductal carcinoma that was ER positive 100% PR Negative HER-2/neu negative grade 2 with KI 67 9%. Patient had 3 sentinel nodes that were negative for metastatic disease.  #2 patient had an Oncotype DX testing performed that showed the recurrence score Of 18. Giving her 11% risk of recurrence at 5 years with tamoxifen alone. This put her in the low-risk category. And does only antiestrogen therapy is being recommended after radiation.  #3 patient is status post radiation therapy.  #3 patient began Arimidex 1 mg daily adjuvantly developed itching, switched to Femara and exemestane and then to tamoxifen. Now to be switched back to Arimidex 1 mg daily from 01/09/2014, patient to finish treatment August 2018   CURRENT THERAPY: Arimidex 1 mg daily CHIEF COMPLIANT: followup of breast cancer  INTERVAL HISTORY: Katelyn Walsh is a 63 year old Caucasian lady with above-mentioned history of invasive ductal carcinoma the left breast treated with lumpectomy and radiation therapy. She has been on oral antiestrogen therapy different drugs over  the past 2 years. Most recently she is currently on tamoxifen. She developed profound itching sensation which led to changing treatment for one to the other. This follow these changes, her speech and sensation does not resolve. she continues to have severe itching and wishes to go back to Arimidex since it is a better drug than tamoxifen.  REVIEW OF SYSTEMS:   Constitutional: Denies fevers, chills or abnormal weight loss Eyes: Denies blurriness of vision Ears, nose, mouth, throat, and face: Denies mucositis or sore throat Respiratory: Denies cough, dyspnea or wheezes Cardiovascular: Denies palpitation, chest discomfort or lower extremity swelling Gastrointestinal:  Denies nausea, heartburn or change in bowel habits Skin: itching and skin rash Lymphatics: Denies new lymphadenopathy or easy bruising Neurological:Denies numbness, tingling or new weaknesses Behavioral/Psych: Mood is stable, no new changes  Breast:  denies any pain or lumps or nodules in either breasts All other systems were reviewed with the patient and are negative.  I have reviewed the past medical history, past surgical history, social history and family history with the patient and they are unchanged from previous note.  ALLERGIES:  has no active allergies.  MEDICATIONS:  Current Outpatient Prescriptions  Medication Sig Dispense Refill  . aspirin EC 81 MG tablet Take 81 mg by mouth daily.    . Cholecalciferol (VITAMIN D3) 2000 UNITS TABS Take 1 capsule by mouth daily.    [name] Kitchen EPIPEN 2-PAK 0.3 MG/0.3ML SOAJ injection   1  . escitalopram (LEXAPRO) 20 MG tablet Take 20 mg by mouth daily.    . hydrOXYzine (ATARAX/VISTARIL) 10 MG tablet Take 1 tablet (10  mg total) by mouth 3 (three) times daily as needed for itching. (Patient taking differently: Take 25 mg by mouth 3 (three) times daily as needed for itching. ) 30 tablet 0  . hydrOXYzine (ATARAX/VISTARIL) 25 MG tablet   2  . losartan (COZAAR) 100 MG tablet Take 100 mg by mouth  every morning.     . Probiotic Product (ALIGN) 4 MG CAPS Take 1 capsule by mouth daily with breakfast.     . REQUIP 1 MG tablet Take 1 mg by mouth at bedtime. For restless leg    . simvastatin (ZOCOR) 40 MG tablet Take 40 mg by mouth at bedtime.    . topiramate (TOPAMAX) 100 MG tablet Take 100 mg by mouth 2 (two) times daily.    [name] Kitchen UNABLE TO FIND Rx: C1275- Mastectomy Bras (Quantity: 6) Dx: 174.9; Left partial mastectomy 1 each 0  . UNABLE TO FIND Rx: T7001- Mastectomy Form replacement, left (Quantity: 1) Dx: 174.9; Left lumpectomy 1 each 0  . ZADITOR 0.025 % ophthalmic solution   3  . anastrozole (ARIMIDEX) 1 MG tablet Take 1 tablet (1 mg total) by mouth daily. 90 tablet 3   No current facility-administered medications for this visit.    PHYSICAL EXAMINATION: ECOG PERFORMANCE STATUS: 1 - Symptomatic but completely ambulatory  Filed Vitals:   01/09/14 1353  BP: 130/74  Pulse: 70  Temp: 98.2 F (36.8 C)  Resp: 18   Filed Weights   01/09/14 1353  Weight: 223 lb 6.4 oz (101.334 kg)    GENERAL:alert, no distress and comfortable SKIN: skin color, texture, turgor are normal, no rashes or significant lesions EYES: normal, Conjunctiva are pink and non-injected, sclera clear OROPHARYNX:no exudate, no erythema and lips, buccal mucosa, and tongue normal  NECK: supple, thyroid normal size, non-tender, without nodularity LYMPH:  no palpable lymphadenopathy in the cervical, axillary or inguinal LUNGS: clear to auscultation and percussion with normal breathing effort HEART: regular rate & rhythm and no murmurs and no lower extremity edema ABDOMEN:abdomen soft, non-tender and normal bowel sounds Musculoskeletal:no cyanosis of digits and no clubbing  NEURO: alert & oriented x 3 with fluent speech, no focal motor/sensory deficits BREAST: No palpable masses or nodules in either right or left breasts. No palpable axillary supraclavicular or infraclavicular adenopathy no breast tenderness or  nipple discharge.   LABORATORY DATA:  I have reviewed the data as listed   Chemistry      Component Value Date/Time   NA 142 01/09/2014 1337   NA 138 07/04/2012 1335   K 4.0 01/09/2014 1337   K 3.5 07/04/2012 1335   CL 104 07/04/2012 1335   CL 107 01/18/2012 1314   CO2 20* 01/09/2014 1337   CO2 23 07/04/2012 1335   BUN 13.4 01/09/2014 1337   BUN 13 07/04/2012 1335   CREATININE 1.1 01/09/2014 1337   CREATININE 0.96 07/04/2012 1335      Component Value Date/Time   CALCIUM 9.0 01/09/2014 1337   CALCIUM 9.4 07/04/2012 1335   ALKPHOS 78 01/09/2014 1337   ALKPHOS 94 07/28/2011 1615   AST 23 01/09/2014 1337   AST 27 07/28/2011 1615   ALT 23 01/09/2014 1337   ALT 29 07/28/2011 1615   BILITOT 0.72 01/09/2014 1337   BILITOT 0.9 07/28/2011 1615       Lab Results  Component Value Date   WBC 4.7 01/09/2014   HGB 13.4 01/09/2014   HCT 41.1 01/09/2014   MCV 93.6 01/09/2014   PLT 176 01/09/2014   NEUTROABS  2.9 01/09/2014     RADIOGRAPHIC STUDIES: Mammograms done June 2015 were normal on the left breast, benign calcifications are being followed  ASSESSMENT & PLAN:  Cancer of upper-inner quadrant of female breast Left breast invasive ductal carcinoma, 1.8 cm, T1 C. N0 M0 stage IA ER percent, PR negative, HER-2 negative, grade 2, Ki-67 90%, 3 SLN negative, Oncotype DX recurrence score 18, percent risk of recurrence, status post radiation therapy, Arimidex 1 mg daily was started August thousand and 13 later switched to exemestane then switched to Femara and then switch to tamoxifen because of itching sensation of the skin.  Tamoxifen toxicities: Patient continues to have itching sensation in spite of switching her from multiple different medications. She wishes to do she can go back to Arimidex since it is a better drug for back to tamoxifen. She is seeing an allergist. She is being referred to a dermatologist agreed for [place]. Other than the itching sensation she has no major side  effects of tamoxifen.  Recommendation: I will switch her from tamoxifen to Arimidex. I will see her back in January to assess tolerance to treatment as well as to followup on the mammograms.  Surveillance: [facility] breast exam was without any abnormalities. Patient has a benign calcifications in the left breast which are being watched by every six-month mammograms. She will undergo bilateral mammograms in January and I will see her back January third week to discuss the mammogram results as well as to see if she is tolerating Arimidex well.   Orders Placed This Encounter  Procedures  . DG Bone Density    EPIC ORDER PF;01/14/2012 [facility]      NO NEEDS FD/CLAMENSIA    BCBS     Standing Status: Future     Number of Occurrences:      Standing Expiration Date: 01/09/2015    Order Specific Question:  Reason for Exam (SYMPTOM  OR DIAGNOSIS REQUIRED)    Answer:  On aromatase inhibitor therapy    Order Specific Question:  Preferred imaging location?    Answer:  [facility]   The patient has a good understanding of the overall plan. she agrees with it. She will call with any problems that may develop before her next visit here.   Rulon Eisenmenger, MD 01/09/2014 3:34 PM

## 2014-01-09 NOTE — Assessment & Plan Note (Signed)
Left breast invasive ductal carcinoma, 1.8 cm, T1 C. N0 M0 stage IA ER percent, PR negative, HER-2 negative, grade 2, Ki-67 90%, 3 SLN negative, Oncotype DX recurrence score 18, percent risk of recurrence, status post radiation therapy, Arimidex 1 mg daily was started August thousand and 13 later switched to exemestane then switched to Femara and then switch to tamoxifen because of itching sensation of the skin.  Tamoxifen toxicities: Patient continues to have itching sensation in spite of switching her from multiple different medications. She wishes to do she can go back to Arimidex since it is a better drug for back to tamoxifen. She is seeing an allergist. She is being referred to a dermatologist agreed for [place]. Other than the itching sensation she has no major side effects of tamoxifen.  Recommendation: I will switch her from tamoxifen to Arimidex. I will see her back in January to assess tolerance to treatment as well as to followup on the mammograms.  Surveillance: [facility] breast exam was without any abnormalities. Patient has a benign calcifications in the left breast which are being watched by every six-month mammograms. She will undergo bilateral mammograms in January and I will see her back January third week to discuss the mammogram results as well as to see if she is tolerating Arimidex well.

## 2014-01-09 NOTE — Telephone Encounter (Signed)
per pof to sch pt appt-gave pt copy of sch-appt for DEXA made @ [facility]

## 2014-01-10 LAB — VITAMIN D 25 HYDROXY (VIT D DEFICIENCY, FRACTURES): Vit D, 25-Hydroxy: 27 ng/mL — ABNORMAL LOW (ref 30–100)

## 2014-01-26 ENCOUNTER — Other Ambulatory Visit: Payer: Federal, State, Local not specified - PPO

## 2014-02-06 ENCOUNTER — Ambulatory Visit
Admission: RE | Admit: 2014-02-06 | Discharge: 2014-02-06 | Disposition: A | Discharge status: Home / Self Care | Payer: Federal, State, Local not specified - PPO | Source: Ambulatory Visit | Attending: Hematology and Oncology | Admitting: Hematology and Oncology

## 2014-02-06 DIAGNOSIS — C50212 Malignant neoplasm of upper-inner quadrant of left female breast: Secondary | ICD-10-CM

## 2014-02-06 DIAGNOSIS — C50219 Malignant neoplasm of upper-inner quadrant of unspecified female breast: Secondary | ICD-10-CM

## 2014-02-22 ENCOUNTER — Telehealth: Payer: Self-pay

## 2014-02-22 NOTE — Telephone Encounter (Signed)
Bone density results dtd 02/06/14 received from [facility].  Copy to Dr Lindi Adie.  Sent to scan.

## 2014-03-15 ENCOUNTER — Telehealth: Payer: Self-pay | Admitting: Hematology and Oncology

## 2014-03-15 ENCOUNTER — Ambulatory Visit: Payer: Federal, State, Local not specified - PPO | Admitting: Hematology and Oncology

## 2014-03-15 NOTE — Telephone Encounter (Signed)
, °

## 2014-03-15 NOTE — Assessment & Plan Note (Signed)
Left breast invasive ductal carcinoma T1 cN0 M0 stage IA ER 100%, PR 0%, HER-2 negative, grade 2, cast 79%, Oncotype DX score 18, 11% risk of recurrence, low risk  Arimidex toxicities:  Breast cancer surveillance: 1. Breast exam done 03/15/2014 is normal 2. Mammogram 08/17/2013 revealed calcifications and a six-month follow-up was recommended 3. Bone density is normal December 2015.  

## 2014-03-28 ENCOUNTER — Telehealth: Payer: Self-pay | Admitting: Hematology and Oncology

## 2014-03-28 NOTE — Telephone Encounter (Signed)
, °

## 2014-04-03 ENCOUNTER — Ambulatory Visit: Payer: Federal, State, Local not specified - PPO | Admitting: Hematology and Oncology

## 2014-04-13 ENCOUNTER — Telehealth: Payer: Self-pay | Admitting: Hematology and Oncology

## 2014-04-13 ENCOUNTER — Other Ambulatory Visit: Payer: Self-pay | Admitting: Hematology and Oncology

## 2014-04-13 DIAGNOSIS — Z853 Personal history of malignant neoplasm of breast: Secondary | ICD-10-CM

## 2014-04-13 DIAGNOSIS — N61 Mastitis without abscess: Secondary | ICD-10-CM

## 2014-04-13 NOTE — Telephone Encounter (Signed)
, °

## 2014-04-20 ENCOUNTER — Ambulatory Visit
Admission: RE | Admit: 2014-04-20 | Discharge: 2014-04-20 | Disposition: A | Discharge status: Home / Self Care | Payer: Federal, State, Local not specified - PPO | Source: Ambulatory Visit | Attending: Hematology and Oncology | Admitting: Hematology and Oncology

## 2014-04-20 DIAGNOSIS — N61 Mastitis without abscess: Secondary | ICD-10-CM

## 2014-04-20 DIAGNOSIS — Z853 Personal history of malignant neoplasm of breast: Secondary | ICD-10-CM

## 2014-05-03 ENCOUNTER — Ambulatory Visit (HOSPITAL_BASED_OUTPATIENT_CLINIC_OR_DEPARTMENT_OTHER): Payer: Medicare Other | Admitting: Hematology and Oncology

## 2014-05-03 ENCOUNTER — Telehealth: Payer: Self-pay | Admitting: Hematology and Oncology

## 2014-05-03 DIAGNOSIS — Z170 Estrogen receptor positive status [ER+]: Secondary | ICD-10-CM

## 2014-05-03 DIAGNOSIS — C50212 Malignant neoplasm of upper-inner quadrant of left female breast: Secondary | ICD-10-CM

## 2014-05-03 MED ORDER — HYDROXYZINE HCL 25 MG PO TABS: 25.0000 mg | ORAL_TABLET | Freq: Three times a day (TID) | ORAL | Status: DC | PRN

## 2014-05-03 NOTE — Assessment & Plan Note (Signed)
Left breast invasive ductal carcinoma, 1.8 cm, T1 C. N0 M0 stage IA ER percent, PR negative, HER-2 negative, grade 2, Ki-67 90%, 3 SLN negative, Oncotype DX recurrence score 18, percent risk of recurrence, status post radiation therapy, Arimidex 1 mg daily was started August 2013 later switched to exemestane then switched to Femara and then switch to tamoxifen and again switched back to Anastrozole starting Nov 2015.  Anastrozole toxicities: 1. Itching of the skin is much improved. Patient would like to refill on hydroxyzine which is sent electronically. 2. Bone density December 2015 revealed a T score of -0.9, normal bone density 3. Patient complains of fatigue and weight gain and wonders if it is because of anastrozole.  Weight gain issues: I discussed with her at length that she needs to discontinue eating processed sugary foods and to limit her intake of bread and pasta. She also needs to join [employer] and or [employer] on their live strong program to lose weight. Patient loves going to [facility].  I will do blood work with TSH and B-12 levels with her next visit.  Breast cancer surveillance: Mammograms done in February 2016 were normal one-year mammogram was recommended  Return to clinic in 3 months with labs and follow

## 2014-05-03 NOTE — Telephone Encounter (Signed)
appts made and avs printed for pt  Katelyn Walsh °

## 2014-05-03 NOTE — Progress Notes (Signed)
Patient Care Team: Velna Hatchet, MD as PCP - General (Internal Medicine) Lavone Orn, MD as Attending Physician (Internal Medicine) Harlin Heys, MD as Referring Physician (Family Medicine)  DIAGNOSIS: Primary cancer of upper inner quadrant of left female breast   Staging form: Breast, AJCC 7th Edition     Clinical: Stage IA (T1c, N0, cM0) - Unsigned       Staging comments: Staged in Breast Conference 5.8.13      Pathologic: Stage IA (T1c, N0, cM0) - Unsigned   SUMMARY OF ONCOLOGIC HISTORY:   Primary cancer of upper inner quadrant of left female breast   07/31/2011 Surgery left breast lumpectomy: IDC grade to 1.8 cm, no LV are, with DCIS, 3 sentinel nodes negative, grade 2, ER 100%, PR 0%, HER-2 negative, Ki-67 9%, T1 cN0 M0 stage IA, Oncotype score 18, 11% ROR low risk   10/14/2011 - 11/17/2011 Radiation Therapy Adjuvant Radiation therapy   10/14/2011 -  Anti-estrogen oral therapy Arimidex 1 mg daily developed a change switched to Femara and exemestane and then took tamoxifen and now back to Arimidex from 01/09/2014 to complete August 2018    CHIEF COMPLIANT: Fatigue and weight gain  INTERVAL HISTORY: Katelyn Walsh is a 64 year old with above-mentioned history of left rest cancer treated with lumpectomy and radiation and is currently on antiestrogen therapy. She is currently on Arimidex the PSV tolerating this much better with much less itching sensation of the skin. She complains of fatigue and weight gain. She reports that she is awake most of the night and sleeps around 3 AM and wakes up around 3 PM along with her mother who is 67 years old. She does not follow any proper diet and does not do any exercise.  REVIEW OF SYSTEMS:   Constitutional: Denies fevers, chills or abnormal weight loss Eyes: Denies blurriness of vision Ears, nose, mouth, throat, and face: Denies mucositis or sore throat Respiratory: Denies cough, dyspnea or wheezes Cardiovascular: Denies palpitation, chest  discomfort or lower extremity swelling Gastrointestinal:  Denies nausea, heartburn or change in bowel habits Skin: Denies abnormal skin rashes Lymphatics: Denies new lymphadenopathy or easy bruising Neurological:Denies numbness, tingling or new weaknesses Behavioral/Psych: Mood is stable, no new changes  Breast:  denies any pain or lumps or nodules in either breasts All other systems were reviewed with the patient and are negative.  I have reviewed the past medical history, past surgical history, social history and family history with the patient and they are unchanged from previous note.  ALLERGIES:  has no active allergies.  MEDICATIONS:  Current Outpatient Prescriptions  Medication Sig Dispense Refill  . anastrozole (ARIMIDEX) 1 MG tablet Take 1 tablet (1 mg total) by mouth daily. 90 tablet 3  . aspirin EC 81 MG tablet Take 81 mg by mouth daily.    . Cholecalciferol (VITAMIN D3) 2000 UNITS TABS Take 1 capsule by mouth daily.    . citalopram (CELEXA) 40 MG tablet   3  . EPIPEN 2-PAK 0.3 MG/0.3ML SOAJ injection   1  . escitalopram (LEXAPRO) 20 MG tablet Take 20 mg by mouth daily.    . hydrOXYzine (ATARAX/VISTARIL) 10 MG tablet Take 1 tablet (10 mg total) by mouth 3 (three) times daily as needed for itching. (Patient taking differently: Take 25 mg by mouth 3 (three) times daily as needed for itching. ) 30 tablet 0  . hydrOXYzine (ATARAX/VISTARIL) 25 MG tablet Take 1 tablet (25 mg total) by mouth 3 (three) times daily as needed for itching. 30 tablet  2  . losartan (COZAAR) 100 MG tablet Take 100 mg by mouth every morning.     . Probiotic Product (ALIGN) 4 MG CAPS Take 1 capsule by mouth daily with breakfast.     . REA LO 40 40 % CREA   1  . REQUIP 1 MG tablet Take 1 mg by mouth at bedtime. For restless leg    . simvastatin (ZOCOR) 40 MG tablet Take 40 mg by mouth at bedtime.    . topiramate (TOPAMAX) 100 MG tablet Take 100 mg by mouth 2 (two) times daily.    [name] Kitchen UNABLE TO FIND Rx: [id number]-  Mastectomy Bras (Quantity: 6) Dx: 174.9; Left partial mastectomy 1 each 0  . UNABLE TO FIND Rx: [id number]- Mastectomy Form replacement, left (Quantity: 1) Dx: 174.9; Left lumpectomy 1 each 0  . ZADITOR 0.025 % ophthalmic solution   3   No current facility-administered medications for this visit.    PHYSICAL EXAMINATION: ECOG PERFORMANCE STATUS: 1 - Symptomatic but completely ambulatory  Filed Vitals:   05/03/14 1418  BP: 145/68  Pulse: 80  Temp: 98.2 F (36.8 C)  Resp: 18   Filed Weights   05/03/14 1418  Weight: 229 lb 9.6 oz (104.146 kg)    GENERAL:alert, no distress and comfortable SKIN: skin color, texture, turgor are normal, no rashes or significant lesions EYES: normal, Conjunctiva are pink and non-injected, sclera clear OROPHARYNX:no exudate, no erythema and lips, buccal mucosa, and tongue normal  NECK: supple, thyroid normal size, non-tender, without nodularity LYMPH:  no palpable lymphadenopathy in the cervical, axillary or inguinal LUNGS: clear to auscultation and percussion with normal breathing effort HEART: regular rate & rhythm and no murmurs and no lower extremity edema ABDOMEN:abdomen soft, non-tender and normal bowel sounds Musculoskeletal:no cyanosis of digits and no clubbing  NEURO: alert & oriented x 3 with fluent speech, no focal motor/sensory deficits  LABORATORY DATA:  I have reviewed the data as listed   Chemistry      Component Value Date/Time   NA 142 01/09/2014 1337   NA 138 07/04/2012 1335   K 4.0 01/09/2014 1337   K 3.5 07/04/2012 1335   CL 104 07/04/2012 1335   CL 107 01/18/2012 1314   CO2 20* 01/09/2014 1337   CO2 23 07/04/2012 1335   BUN 13.4 01/09/2014 1337   BUN 13 07/04/2012 1335   CREATININE 1.1 01/09/2014 1337   CREATININE 0.96 07/04/2012 1335      Component Value Date/Time   CALCIUM 9.0 01/09/2014 1337   CALCIUM 9.4 07/04/2012 1335   ALKPHOS 78 01/09/2014 1337   ALKPHOS 94 07/28/2011 1615   AST 23 01/09/2014 1337   AST 27  07/28/2011 1615   ALT 23 01/09/2014 1337   ALT 29 07/28/2011 1615   BILITOT 0.72 01/09/2014 1337   BILITOT 0.9 07/28/2011 1615       Lab Results  Component Value Date   WBC 4.7 01/09/2014   HGB 13.4 01/09/2014   HCT 41.1 01/09/2014   MCV 93.6 01/09/2014   PLT 176 01/09/2014   NEUTROABS 2.9 01/09/2014    ASSESSMENT & PLAN:  Primary cancer of upper inner quadrant of left female breast Left breast invasive ductal carcinoma, 1.8 cm, T1 C. N0 M0 stage IA ER percent, PR negative, HER-2 negative, grade 2, Ki-67 90%, 3 SLN negative, Oncotype DX recurrence score 18, percent risk of recurrence, status post radiation therapy, Arimidex 1 mg daily was started August 2013 later switched to exemestane then switched to Femara and  then switch to tamoxifen and again switched back to Anastrozole starting Nov 2015.  Anastrozole toxicities: 1. Itching of the skin is much improved. Patient would like to refill on hydroxyzine which is sent electronically. 2. Bone density December 2015 revealed a T score of -0.9, normal bone density 3. Patient complains of fatigue and weight gain and wonders if it is because of anastrozole.  Weight gain issues: I discussed with her at length that she needs to discontinue eating processed sugary foods and to limit her intake of bread and pasta. She also needs to join [employer] and or [employer] on their live strong program to lose weight. Patient loves going to [facility].  I will do blood work with TSH and B-12 levels with her next visit.  Breast cancer surveillance: Mammograms done in February 2016 were normal one-year mammogram was recommended  Return to clinic in 3 months with labs and follow    Orders Placed This Encounter  Procedures  . CBC with Differential    Standing Status: Future     Number of Occurrences:      Standing Expiration Date: 05/03/2015  . Comprehensive metabolic panel (Cmet) - [facility]    Standing Status: Future     Number of Occurrences:       Standing Expiration Date: 05/03/2015  . TSH    Standing Status: Future     Number of Occurrences:      Standing Expiration Date: 06/07/2015  . Vitamin B12    Standing Status: Future     Number of Occurrences:      Standing Expiration Date: 05/03/2015   The patient has a good understanding of the overall plan. she agrees with it. She will call with any problems that may develop before her next visit here.   Rulon Eisenmenger, MD

## 2014-05-22 ENCOUNTER — Ambulatory Visit: Payer: Federal, State, Local not specified - PPO | Admitting: Hematology and Oncology

## 2014-05-23 DIAGNOSIS — R05 Cough: Secondary | ICD-10-CM | POA: Diagnosis not present

## 2014-05-23 DIAGNOSIS — I10 Essential (primary) hypertension: Secondary | ICD-10-CM | POA: Diagnosis not present

## 2014-05-23 DIAGNOSIS — J209 Acute bronchitis, unspecified: Secondary | ICD-10-CM | POA: Diagnosis not present

## 2014-05-23 DIAGNOSIS — J309 Allergic rhinitis, unspecified: Secondary | ICD-10-CM | POA: Diagnosis not present

## 2014-05-23 DIAGNOSIS — Z6836 Body mass index (BMI) 36.0-36.9, adult: Secondary | ICD-10-CM | POA: Diagnosis not present

## 2014-07-17 ENCOUNTER — Telehealth: Payer: Self-pay | Admitting: Hematology and Oncology

## 2014-07-17 NOTE — Telephone Encounter (Signed)
Moved 6/1 appointments to 6/7 due to center event. Left message for patient and mailed schedule.

## 2014-07-25 ENCOUNTER — Other Ambulatory Visit: Payer: Federal, State, Local not specified - PPO

## 2014-07-25 ENCOUNTER — Ambulatory Visit: Payer: Federal, State, Local not specified - PPO | Admitting: Hematology and Oncology

## 2014-07-31 ENCOUNTER — Other Ambulatory Visit (HOSPITAL_BASED_OUTPATIENT_CLINIC_OR_DEPARTMENT_OTHER): Payer: Medicare Other

## 2014-07-31 ENCOUNTER — Telehealth: Payer: Self-pay | Admitting: Hematology and Oncology

## 2014-07-31 ENCOUNTER — Ambulatory Visit (HOSPITAL_BASED_OUTPATIENT_CLINIC_OR_DEPARTMENT_OTHER): Payer: Medicare Other | Admitting: Hematology and Oncology

## 2014-07-31 VITALS — BP 140/80 | HR 75 | Temp 98.6°F | Resp 18 | Ht 65.0 in | Wt 230.8 lb

## 2014-07-31 DIAGNOSIS — R635 Abnormal weight gain: Secondary | ICD-10-CM

## 2014-07-31 DIAGNOSIS — D519 Vitamin B12 deficiency anemia, unspecified: Secondary | ICD-10-CM

## 2014-07-31 DIAGNOSIS — C50212 Malignant neoplasm of upper-inner quadrant of left female breast: Secondary | ICD-10-CM

## 2014-07-31 HISTORY — DX: Vitamin B12 deficiency anemia, unspecified: D51.9

## 2014-07-31 LAB — CBC WITH DIFFERENTIAL/PLATELET
BASO%: 0.4 % (ref 0.0–2.0)
Basophils Absolute: 0 10*3/uL (ref 0.0–0.1)
EOS%: 3.6 % (ref 0.0–7.0)
Eosinophils Absolute: 0.2 10*3/uL (ref 0.0–0.5)
HCT: 38.9 % (ref 34.8–46.6)
HGB: 12.7 g/dL (ref 11.6–15.9)
LYMPH%: 27.7 % (ref 14.0–49.7)
MCH: 30.2 pg (ref 25.1–34.0)
MCHC: 32.6 g/dL (ref 31.5–36.0)
MCV: 92.6 fL (ref 79.5–101.0)
MONO#: 0.3 10*3/uL (ref 0.1–0.9)
MONO%: 6.8 % (ref 0.0–14.0)
NEUT#: 3.1 10*3/uL (ref 1.5–6.5)
NEUT%: 61.5 % (ref 38.4–76.8)
Platelets: 193 10*3/uL (ref 145–400)
RBC: 4.2 10*6/uL (ref 3.70–5.45)
RDW: 12.8 % (ref 11.2–14.5)
WBC: 5 10*3/uL (ref 3.9–10.3)
lymph#: 1.4 10*3/uL (ref 0.9–3.3)

## 2014-07-31 LAB — COMPREHENSIVE METABOLIC PANEL (CC13)
ALT: 23 U/L (ref 0–55)
AST: 22 U/L (ref 5–34)
Albumin: 3.8 g/dL (ref 3.5–5.0)
Alkaline Phosphatase: 74 U/L (ref 40–150)
Anion Gap: 8 mEq/L (ref 3–11)
BUN: 12.4 mg/dL (ref 7.0–26.0)
CO2: 23 mEq/L (ref 22–29)
Calcium: 8.9 mg/dL (ref 8.4–10.4)
Chloride: 111 mEq/L — ABNORMAL HIGH (ref 98–109)
Creatinine: 0.9 mg/dL (ref 0.6–1.1)
EGFR: 70 mL/min/{1.73_m2} — ABNORMAL LOW (ref >90)
Glucose: 124 mg/dl (ref 70–140)
Potassium: 4.1 mEq/L (ref 3.5–5.1)
Sodium: 142 mEq/L (ref 136–145)
Total Bilirubin: 0.66 mg/dL (ref 0.20–1.20)
Total Protein: 6.8 g/dL (ref 6.4–8.3)

## 2014-07-31 LAB — VITAMIN B12: Vitamin B-12: 358 pg/mL (ref 211–911)

## 2014-07-31 NOTE — Assessment & Plan Note (Signed)
Left breast invasive ductal carcinoma, 1.8 cm, T1 C. N0 M0 stage IA ER percent, PR negative, HER-2 negative, grade 2, Ki-67 90%, 3 SLN negative, Oncotype DX recurrence score 18, percent risk of recurrence, status post radiation therapy, Arimidex 1 mg daily was started August 2013 later switched to exemestane then switched to Femara and then switch to tamoxifen and again switched back to Anastrozole starting Nov 2015.  Anastrozole toxicities: 1. Itching of the skin is much improved. Patient would like to refill on hydroxyzine which is sent electronically. 2. Bone density December 2015 revealed a T score of -0.9, normal bone density 3. Patient complains of fatigue and weight gain and wonders if it is because of anastrozole.  Weight gain issues: I discussed with her at length that she needs to discontinue eating processed sugary foods and to limit her intake of bread and pasta. She also needs to join [employer] and or [employer] on their live strong program to lose weight. Patient loves going to [facility].  TSH and B-12 were being checked today  Breast Cancer Surveillance: 1. Breast exam 07/31/2014: Normal 2. Mammogram 04/20/2014 No abnormalities. Postsurgical changes. Breast Density Category B. I recommended that she get 3-D mammograms for surveillance. Discussed the differences between different breast density categories.   Return to clinic in 6 months for follow-up

## 2014-07-31 NOTE — Progress Notes (Signed)
Patient Care Team: Velna Hatchet, MD as PCP - General (Internal Medicine) Lavone Orn, MD as Attending Physician (Internal Medicine) Harlin Heys, MD as Referring Physician (Family Medicine)  DIAGNOSIS: Primary cancer of upper inner quadrant of left female breast   Staging form: Breast, AJCC 7th Edition     Clinical: Stage IA (T1c, N0, cM0) - Unsigned       Staging comments: Staged in Breast Conference 5.8.13      Pathologic: Stage IA (T1c, N0, cM0) - Unsigned   SUMMARY OF ONCOLOGIC HISTORY:   Primary cancer of upper inner quadrant of left female breast   07/31/2011 Surgery left breast lumpectomy: IDC grade to 1.8 cm, no LV are, with DCIS, 3 sentinel nodes negative, grade 2, ER 100%, PR 0%, HER-2 negative, Ki-67 9%, T1 cN0 M0 stage IA, Oncotype score 18, 11% ROR low risk   10/14/2011 - 11/17/2011 Radiation Therapy Adjuvant Radiation therapy   10/14/2011 -  Anti-estrogen oral therapy Arimidex 1 mg daily developed a change switched to Femara and exemestane and then took tamoxifen and now back to Arimidex from 01/09/2014 to complete August 2018    CHIEF COMPLIANT: Profound skin itching  INTERVAL HISTORY: Katelyn Walsh is a 44 with above-mentioned history of left breast cancer treated with lumpectomy and radiation currently on Arimidex. She has profound itching of the skin throughout her body and she continues to scratches everywhere. Denies any lumps or nodules in the breasts.  REVIEW OF SYSTEMS:   Constitutional: Denies fevers, chills or abnormal weight loss Eyes: Denies blurriness of vision Ears, nose, mouth, throat, and face: Denies mucositis or sore throat Respiratory: Denies cough, dyspnea or wheezes Cardiovascular: Denies palpitation, chest discomfort or lower extremity swelling Gastrointestinal:  Denies nausea, heartburn or change in bowel habits Skin: Diffuse itching throughout her body Lymphatics: Denies new lymphadenopathy or easy bruising Neurological:Denies numbness,  tingling or new weaknesses Behavioral/Psych: Mood is stable, no new changes  Breast:  denies any pain or lumps or nodules in either breasts All other systems were reviewed with the patient and are negative.  I have reviewed the past medical history, past surgical history, social history and family history with the patient and they are unchanged from previous note.  ALLERGIES:  has no active allergies.  MEDICATIONS:  Current Outpatient Prescriptions  Medication Sig Dispense Refill  . anastrozole (ARIMIDEX) 1 MG tablet Take 1 tablet (1 mg total) by mouth daily. 90 tablet 3  . aspirin EC 81 MG tablet Take 81 mg by mouth daily.    . Cholecalciferol (VITAMIN D3) 2000 UNITS TABS Take 1 capsule by mouth daily.    . citalopram (CELEXA) 40 MG tablet   3  . EPIPEN 2-PAK 0.3 MG/0.3ML SOAJ injection   1  . hydrOXYzine (ATARAX/VISTARIL) 25 MG tablet Take 1 tablet (25 mg total) by mouth 3 (three) times daily as needed for itching. 30 tablet 2  . losartan (COZAAR) 100 MG tablet Take 100 mg by mouth every morning.     . Probiotic Product (ALIGN) 4 MG CAPS Take 1 capsule by mouth daily with breakfast.     . REA LO 40 40 % CREA   1  . REQUIP 1 MG tablet Take 1 mg by mouth at bedtime. For restless leg    . simvastatin (ZOCOR) 40 MG tablet Take 40 mg by mouth at bedtime.    . topiramate (TOPAMAX) 100 MG tablet Take 100 mg by mouth 2 (two) times daily.    [name] Kitchen UNABLE TO FIND Rx:  [id number]- Mastectomy Bras (Quantity: 6) Dx: 174.9; Left partial mastectomy 1 each 0  . UNABLE TO FIND Rx: [id number]- Mastectomy Form replacement, left (Quantity: 1) Dx: 174.9; Left lumpectomy 1 each 0   No current facility-administered medications for this visit.    PHYSICAL EXAMINATION: ECOG PERFORMANCE STATUS: 1 - Symptomatic but completely ambulatory  Filed Vitals:   07/31/14 1441  BP: 140/80  Pulse: 75  Temp: 98.6 F (37 C)  Resp: 18   Filed Weights   07/31/14 1441  Weight: 230 lb 12.8 oz (104.69 kg)    GENERAL:alert, no  distress and comfortable SKIN: skin color, texture, turgor are normal, no rashes or significant lesions EYES: normal, Conjunctiva are pink and non-injected, sclera clear OROPHARYNX:no exudate, no erythema and lips, buccal mucosa, and tongue normal  NECK: supple, thyroid normal size, non-tender, without nodularity LYMPH:  no palpable lymphadenopathy in the cervical, axillary or inguinal LUNGS: clear to auscultation and percussion with normal breathing effort HEART: regular rate & rhythm and no murmurs and no lower extremity edema ABDOMEN:abdomen soft, non-tender and normal bowel sounds Musculoskeletal:no cyanosis of digits and no clubbing  NEURO: alert & oriented x 3 with fluent speech, no focal motor/sensory deficits   LABORATORY DATA:  I have reviewed the data as listed   Chemistry      Component Value Date/Time   NA 142 07/31/2014 1416   NA 138 07/04/2012 1335   K 4.1 07/31/2014 1416   K 3.5 07/04/2012 1335   CL 104 07/04/2012 1335   CL 107 01/18/2012 1314   CO2 23 07/31/2014 1416   CO2 23 07/04/2012 1335   BUN 12.4 07/31/2014 1416   BUN 13 07/04/2012 1335   CREATININE 0.9 07/31/2014 1416   CREATININE 0.96 07/04/2012 1335      Component Value Date/Time   CALCIUM 8.9 07/31/2014 1416   CALCIUM 9.4 07/04/2012 1335   ALKPHOS 74 07/31/2014 1416   ALKPHOS 94 07/28/2011 1615   AST 22 07/31/2014 1416   AST 27 07/28/2011 1615   ALT 23 07/31/2014 1416   ALT 29 07/28/2011 1615   BILITOT 0.66 07/31/2014 1416   BILITOT 0.9 07/28/2011 1615       Lab Results  Component Value Date   WBC 5.0 07/31/2014   HGB 12.7 07/31/2014   HCT 38.9 07/31/2014   MCV 92.6 07/31/2014   PLT 193 07/31/2014   NEUTROABS 3.1 07/31/2014    ASSESSMENT & PLAN:  Primary cancer of upper inner quadrant of left female breast Left breast invasive ductal carcinoma, 1.8 cm, T1 C. N0 M0 stage IA ER percent, PR negative, HER-2 negative, grade 2, Ki-67 90%, 3 SLN negative, Oncotype DX recurrence score 18,  percent risk of recurrence, status post radiation therapy, Arimidex 1 mg daily was started August 2013 later switched to exemestane then switched to Femara and then switch to tamoxifen and again switched back to Anastrozole starting Nov 2015.  Anastrozole toxicities: 1. Itching of the skin is much improved. Patient would like to refill on hydroxyzine which is sent electronically. I discussed with her about discontinuing antiestrogen therapy for 3 months to see if she would have improvement in the itching sensation. If she does have significant improvement we might discontinue therapy at that time. 2. Bone density December 2015 revealed a T score of -0.9, normal bone density 3. Patient complains of fatigue and weight gain and wonders if it is because of anastrozole.  Weight gain issues: I discussed with her at length that she needs to discontinue  eating processed sugary foods and to limit her intake of bread and pasta. She also needs to join [employer] and or [employer] on their live strong program to lose weight. Patient loves going to [facility].  TSH and B-12 were being checked today to evaluate the cause of neuropathy.  Breast Cancer Surveillance: 1. Breast exam 07/31/2014: Normal 2. Mammogram 04/20/2014 No abnormalities. Postsurgical changes. Breast Density Category B. I recommended that she get 3-D mammograms for surveillance. Discussed the differences between different breast density categories.   Return to clinic in 3 months for follow-up to see if she gets better from cutaneous itching.     No orders of the defined types were placed in this encounter.   The patient has a good understanding of the overall plan. she agrees with it. she will call with any problems that may develop before the next visit here.   Rulon Eisenmenger, MD

## 2014-07-31 NOTE — Telephone Encounter (Signed)
Appointments made and avs printed for patient °

## 2014-08-01 ENCOUNTER — Telehealth: Payer: Self-pay | Admitting: *Deleted

## 2014-08-01 LAB — TSH CHCC: TSH: 1.299 m(IU)/L (ref 0.308–3.960)

## 2014-08-01 NOTE — Telephone Encounter (Signed)
Called results to patient, to pick up copy of labs tomorrow.

## 2014-08-01 NOTE — Telephone Encounter (Signed)
Patient called and left message.  She saw Dr. Lindi Adie yesterday and she would like results of the lab tests that were done yesterday.  Dr. Lindi Adie told her her call and ask for his nurse.  Patient call back is [phone]

## 2014-08-20 ENCOUNTER — Other Ambulatory Visit: Payer: Self-pay

## 2014-09-05 DIAGNOSIS — E785 Hyperlipidemia, unspecified: Secondary | ICD-10-CM | POA: Diagnosis not present

## 2014-09-05 DIAGNOSIS — N390 Urinary tract infection, site not specified: Secondary | ICD-10-CM | POA: Diagnosis not present

## 2014-09-05 DIAGNOSIS — E559 Vitamin D deficiency, unspecified: Secondary | ICD-10-CM | POA: Diagnosis not present

## 2014-09-05 DIAGNOSIS — R7301 Impaired fasting glucose: Secondary | ICD-10-CM | POA: Diagnosis not present

## 2014-09-05 DIAGNOSIS — I10 Essential (primary) hypertension: Secondary | ICD-10-CM | POA: Diagnosis not present

## 2014-09-12 DIAGNOSIS — L500 Allergic urticaria: Secondary | ICD-10-CM | POA: Diagnosis not present

## 2014-09-12 DIAGNOSIS — E785 Hyperlipidemia, unspecified: Secondary | ICD-10-CM | POA: Diagnosis not present

## 2014-09-12 DIAGNOSIS — Z6837 Body mass index (BMI) 37.0-37.9, adult: Secondary | ICD-10-CM | POA: Diagnosis not present

## 2014-09-12 DIAGNOSIS — R51 Headache: Secondary | ICD-10-CM | POA: Diagnosis not present

## 2014-09-12 DIAGNOSIS — E559 Vitamin D deficiency, unspecified: Secondary | ICD-10-CM | POA: Diagnosis not present

## 2014-09-12 DIAGNOSIS — J309 Allergic rhinitis, unspecified: Secondary | ICD-10-CM | POA: Diagnosis not present

## 2014-09-12 DIAGNOSIS — E669 Obesity, unspecified: Secondary | ICD-10-CM | POA: Diagnosis not present

## 2014-09-12 DIAGNOSIS — I10 Essential (primary) hypertension: Secondary | ICD-10-CM | POA: Diagnosis not present

## 2014-09-12 DIAGNOSIS — F329 Major depressive disorder, single episode, unspecified: Secondary | ICD-10-CM | POA: Diagnosis not present

## 2014-09-12 DIAGNOSIS — Z0000 Encounter for general adult medical examination without abnormal findings: Secondary | ICD-10-CM | POA: Diagnosis not present

## 2014-09-12 DIAGNOSIS — Z23 Encounter for immunization: Secondary | ICD-10-CM | POA: Diagnosis not present

## 2014-09-12 DIAGNOSIS — R7309 Other abnormal glucose: Secondary | ICD-10-CM | POA: Diagnosis not present

## 2014-10-09 ENCOUNTER — Ambulatory Visit (INDEPENDENT_AMBULATORY_CARE_PROVIDER_SITE_OTHER): Payer: Medicare Other | Admitting: Family Medicine

## 2014-10-09 ENCOUNTER — Ambulatory Visit (INDEPENDENT_AMBULATORY_CARE_PROVIDER_SITE_OTHER): Payer: Medicare Other

## 2014-10-09 VITALS — BP 132/16 | HR 79 | Temp 97.8°F | Resp 16 | Ht 66.0 in | Wt 234.0 lb

## 2014-10-09 DIAGNOSIS — M549 Dorsalgia, unspecified: Secondary | ICD-10-CM

## 2014-10-09 DIAGNOSIS — S161XXA Strain of muscle, fascia and tendon at neck level, initial encounter: Secondary | ICD-10-CM | POA: Diagnosis not present

## 2014-10-09 DIAGNOSIS — R42 Dizziness and giddiness: Secondary | ICD-10-CM

## 2014-10-09 DIAGNOSIS — M546 Pain in thoracic spine: Secondary | ICD-10-CM | POA: Diagnosis not present

## 2014-10-09 MED ORDER — METHOCARBAMOL 500 MG PO TABS: ORAL_TABLET | ORAL | Status: DC

## 2014-10-09 MED ORDER — MECLIZINE HCL 25 MG PO TABS: ORAL_TABLET | ORAL | Status: DC

## 2014-10-09 MED ORDER — DICLOFENAC SODIUM 75 MG PO TBEC: 75.0000 mg | DELAYED_RELEASE_TABLET | Freq: Two times a day (BID) | ORAL | Status: DC

## 2014-10-09 NOTE — Patient Instructions (Signed)
Take diclofenac one twice daily for pain and inflammation  Take methocarbamol muscle relaxant 500 mg 1 in the morning, when the afternoon, and 2 at bedtime  Apply ice or heat to the neck several times daily as needed for some relief of discomfort  Take meclizine 25 mg one half to one tablet 3 times daily if needed for dizziness  The dizziness should gradually resolve time.  Return if worse at any time or if not much improved over the next couple of weeks
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
## 2014-10-09 NOTE — Addendum Note (Signed)
Addended by: Ayo Smoak H on: 10/09/2014 10:41 AM   Modules accepted: Level of Service

## 2014-11-05 ENCOUNTER — Ambulatory Visit (HOSPITAL_BASED_OUTPATIENT_CLINIC_OR_DEPARTMENT_OTHER): Payer: Medicare Other | Admitting: Hematology and Oncology

## 2014-11-05 ENCOUNTER — Encounter: Payer: Self-pay | Admitting: Hematology and Oncology

## 2014-11-05 ENCOUNTER — Telehealth: Payer: Self-pay | Admitting: Hematology and Oncology

## 2014-11-05 VITALS — BP 132/61 | HR 90 | Temp 98.4°F | Resp 18 | Ht 66.0 in | Wt 233.6 lb

## 2014-11-05 DIAGNOSIS — R21 Rash and other nonspecific skin eruption: Secondary | ICD-10-CM

## 2014-11-05 DIAGNOSIS — C50212 Malignant neoplasm of upper-inner quadrant of left female breast: Secondary | ICD-10-CM

## 2014-11-05 MED ORDER — METHYLPREDNISOLONE 4 MG PO TBPK: ORAL_TABLET | ORAL | Status: DC

## 2014-11-05 NOTE — Assessment & Plan Note (Signed)
Left breast invasive ductal carcinoma, 1.8 cm, T1 C. N0 M0 stage IA ER percent, PR negative, HER-2 negative, grade 2, Ki-67 90%, 3 SLN negative, Oncotype DX recurrence score 18, percent risk of recurrence, status post radiation therapy, Arimidex 1 mg daily was started August 2013 later switched to exemestane then switched to Femara and then switch to tamoxifen and again switched back to Anastrozole Nov 2015.  Anastrozole toxicities: 1. Itching of the skin is much improved. Patient would like to refill on hydroxyzine which is sent electronically. I discussed with her about discontinuing antiestrogen therapy for 3 months to see if she would have improvement in the itching sensation. If she does have significant improvement we might discontinue therapy at that time. 2. Bone density December 2015 revealed a T score of -0.9, normal bone density 3. Patient complains of fatigue and weight gain and wonders if it is because of anastrozole.  Weight gain issues: Patient plans to join [employer]  Patient loves going to [facility].  TSH and B-12 are normal  Breast Cancer Surveillance: 1. Breast exam 07/31/2014: Normal 2. Mammogram 04/20/2014 No abnormalities. Postsurgical changes. Breast Density Category B. I recommended that she get 3-D mammograms for surveillance. Discussed the differences between different breast density categories.  Return to clinic in 6 months for follow-up.
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
## 2014-11-05 NOTE — Telephone Encounter (Signed)
Appointments made and avs printed for patient °

## 2014-11-17 DIAGNOSIS — Z23 Encounter for immunization: Secondary | ICD-10-CM | POA: Diagnosis not present

## 2015-01-22 DIAGNOSIS — Z124 Encounter for screening for malignant neoplasm of cervix: Secondary | ICD-10-CM | POA: Diagnosis not present

## 2015-01-22 DIAGNOSIS — J309 Allergic rhinitis, unspecified: Secondary | ICD-10-CM | POA: Diagnosis not present

## 2015-01-22 DIAGNOSIS — N952 Postmenopausal atrophic vaginitis: Secondary | ICD-10-CM | POA: Diagnosis not present

## 2015-01-22 DIAGNOSIS — Z6838 Body mass index (BMI) 38.0-38.9, adult: Secondary | ICD-10-CM | POA: Diagnosis not present

## 2015-01-22 DIAGNOSIS — Z01419 Encounter for gynecological examination (general) (routine) without abnormal findings: Secondary | ICD-10-CM | POA: Diagnosis not present

## 2015-01-22 DIAGNOSIS — I10 Essential (primary) hypertension: Secondary | ICD-10-CM | POA: Diagnosis not present

## 2015-01-22 DIAGNOSIS — J0100 Acute maxillary sinusitis, unspecified: Secondary | ICD-10-CM | POA: Diagnosis not present

## 2015-02-05 DIAGNOSIS — L299 Pruritus, unspecified: Secondary | ICD-10-CM | POA: Diagnosis not present

## 2015-02-05 DIAGNOSIS — J3081 Allergic rhinitis due to animal (cat) (dog) hair and dander: Secondary | ICD-10-CM | POA: Diagnosis not present

## 2015-02-05 DIAGNOSIS — J301 Allergic rhinitis due to pollen: Secondary | ICD-10-CM | POA: Diagnosis not present

## 2015-02-05 DIAGNOSIS — J3089 Other allergic rhinitis: Secondary | ICD-10-CM | POA: Diagnosis not present

## 2015-02-11 DIAGNOSIS — H01135 Eczematous dermatitis of left lower eyelid: Secondary | ICD-10-CM | POA: Diagnosis not present

## 2015-02-11 DIAGNOSIS — H2513 Age-related nuclear cataract, bilateral: Secondary | ICD-10-CM | POA: Diagnosis not present

## 2015-02-11 DIAGNOSIS — H01131 Eczematous dermatitis of right upper eyelid: Secondary | ICD-10-CM | POA: Diagnosis not present

## 2015-02-11 DIAGNOSIS — H16223 Keratoconjunctivitis sicca, not specified as Sjogren's, bilateral: Secondary | ICD-10-CM | POA: Diagnosis not present

## 2015-05-06 ENCOUNTER — Other Ambulatory Visit: Payer: Self-pay | Admitting: Hematology and Oncology

## 2015-05-06 ENCOUNTER — Telehealth: Payer: Self-pay | Admitting: Hematology and Oncology

## 2015-05-06 DIAGNOSIS — Z853 Personal history of malignant neoplasm of breast: Secondary | ICD-10-CM

## 2015-05-06 NOTE — Telephone Encounter (Signed)
Pt called to r/s 3/14 appt to 3/30 at 215 pm

## 2015-05-07 ENCOUNTER — Ambulatory Visit: Payer: Medicare Other | Admitting: Hematology and Oncology

## 2015-05-09 ENCOUNTER — Inpatient Hospital Stay: Admission: RE | Admit: 2015-05-09 | Payer: Medicare Other | Source: Ambulatory Visit

## 2015-05-23 ENCOUNTER — Ambulatory Visit (HOSPITAL_BASED_OUTPATIENT_CLINIC_OR_DEPARTMENT_OTHER): Payer: Medicare Other | Admitting: Hematology and Oncology

## 2015-05-23 ENCOUNTER — Telehealth: Payer: Self-pay | Admitting: Hematology and Oncology

## 2015-05-23 ENCOUNTER — Encounter: Payer: Self-pay | Admitting: Hematology and Oncology

## 2015-05-23 VITALS — BP 150/83 | HR 72 | Temp 98.3°F | Resp 18 | Wt 231.3 lb

## 2015-05-23 DIAGNOSIS — M542 Cervicalgia: Secondary | ICD-10-CM

## 2015-05-23 DIAGNOSIS — R5383 Other fatigue: Secondary | ICD-10-CM

## 2015-05-23 DIAGNOSIS — R635 Abnormal weight gain: Secondary | ICD-10-CM

## 2015-05-23 DIAGNOSIS — C50212 Malignant neoplasm of upper-inner quadrant of left female breast: Secondary | ICD-10-CM

## 2015-05-23 DIAGNOSIS — R42 Dizziness and giddiness: Secondary | ICD-10-CM

## 2015-05-23 MED ORDER — HYDROXYZINE HCL 25 MG PO TABS: 25.0000 mg | ORAL_TABLET | Freq: Three times a day (TID) | ORAL | Status: DC | PRN

## 2015-05-23 MED ORDER — ANASTROZOLE 1 MG PO TABS: 1.0000 mg | ORAL_TABLET | Freq: Every day | ORAL | Status: DC

## 2015-05-23 MED ORDER — UNABLE TO FIND: Status: DC

## 2015-05-23 NOTE — Telephone Encounter (Signed)
Gave and printed appt shced and avs for pt for March 2018

## 2015-05-23 NOTE — Progress Notes (Signed)
Unable to get in to exam room prior to MD.  No assessment performed.  

## 2015-05-23 NOTE — Progress Notes (Signed)
Patient Care Team: Velna Hatchet, MD as PCP - General (Internal Medicine) Lavone Orn, MD as Attending Physician (Internal Medicine) Harlin Heys, MD as Referring Physician [facility])  DIAGNOSIS: Primary cancer of upper inner quadrant of left female breast [facility])   Staging form: Breast, AJCC 7th Edition     Clinical: Stage IA (T1c, N0, cM0) - Unsigned       Staging comments: Staged in Breast Conference 5.8.13      Pathologic: Stage IA (T1c, N0, cM0) - Unsigned  SUMMARY OF ONCOLOGIC HISTORY:   Primary cancer of upper inner quadrant of left female breast ([place])   07/31/2011 Surgery left breast lumpectomy: IDC grade to 1.8 cm, no LV are, with DCIS, 3 sentinel nodes negative, grade 2, ER 100%, PR 0%, HER-2 negative, Ki-67 9%, T1 cN0 M0 stage IA, Oncotype score 18, 11% ROR low risk   10/14/2011 - 11/17/2011 Radiation Therapy Adjuvant Radiation therapy   10/14/2011 -  Anti-estrogen oral therapy Arimidex 1 mg daily developed a change switched to Femara and exemestane and then took tamoxifen and now back to Arimidex from 01/09/2014 to complete August 2018    CHIEF COMPLIANT: follow-up on Arimidex  INTERVAL HISTORY: Katelyn Walsh is a 65 year old with above-mentioned history of left breast cancer currently on adjuvant antiestrogen therapy. She is tolerating Arimidex fairly well. She does have myalgias and fatigue. Some skin sensitivity as well. Hot flashes are not too bad. She does travel to [facility] multiple times throughout the year as she gets complimentary air travel and hotel stay there.  REVIEW OF SYSTEMS:   Constitutional: Denies fevers, chills or abnormal weight loss Eyes: Denies blurriness of vision Ears, nose, mouth, throat, and face: Denies mucositis or sore throat Respiratory: Denies cough, dyspnea or wheezes Cardiovascular: Denies palpitation, chest discomfort Gastrointestinal:  Denies nausea, heartburn or change in bowel habits Skin: Denies abnormal skin  rashes Lymphatics: Denies new lymphadenopathy or easy bruising Neurological:Denies numbness, tingling or new weaknesses Behavioral/Psych: Mood is stable, no new changes  Extremities: No lower extremity edema Breast:  denies any pain or lumps or nodules in either breasts All other systems were reviewed with the patient and are negative.  I have reviewed the past medical history, past surgical history, social history and family history with the patient and they are unchanged from previous note.  ALLERGIES:  is allergic to dust mite extract and pollen extract.  MEDICATIONS:  Current Outpatient Prescriptions  Medication Sig Dispense Refill  . anastrozole (ARIMIDEX) 1 MG tablet Take 1 tablet (1 mg total) by mouth daily. 90 tablet 3  . aspirin EC 81 MG tablet Take 81 mg by mouth daily.    . Cholecalciferol (VITAMIN D3) 2000 UNITS TABS Take 1 capsule by mouth daily.    . citalopram (CELEXA) 40 MG tablet   3  . furosemide (LASIX) 20 MG tablet TK 1 T PO  D PRF LEG SWELLING.  1  . hydrOXYzine (ATARAX/VISTARIL) 25 MG tablet Take 1 tablet (25 mg total) by mouth 3 (three) times daily as needed for itching. 30 tablet 6  . losartan (COZAAR) 100 MG tablet Take 100 mg by mouth every morning.     . Probiotic Product (ALIGN) 4 MG CAPS Take 1 capsule by mouth daily with breakfast.     . REA LO 40 40 % CREA   1  . simvastatin (ZOCOR) 40 MG tablet Take 40 mg by mouth at bedtime.    . topiramate (TOPAMAX) 100 MG tablet Take 100 mg by  mouth 2 (two) times daily.    [name] Kitchen UNABLE TO FIND Rx: [id number]- Mastectomy Bras (Quantity: 6) Dx: 174.9; Left partial mastectomy 1 each 0   No current facility-administered medications for this visit.    PHYSICAL EXAMINATION: ECOG PERFORMANCE STATUS: 1 - Symptomatic but completely ambulatory  Filed Vitals:   05/23/15 1444  BP: 150/83  Pulse: 72  Temp: 98.3 F (36.8 C)  Resp: 18   Filed Weights   05/23/15 1444  Weight: 231 lb 4.8 oz (104.917 kg)    GENERAL:alert, no  distress and comfortable SKIN: skin color, texture, turgor are normal, no rashes or significant lesions EYES: normal, Conjunctiva are pink and non-injected, sclera clear OROPHARYNX:no exudate, no erythema and lips, buccal mucosa, and tongue normal  NECK: supple, thyroid normal size, non-tender, without nodularity LYMPH:  no palpable lymphadenopathy in the cervical, axillary or inguinal LUNGS: clear to auscultation and percussion with normal breathing effort HEART: regular rate & rhythm and no murmurs and no lower extremity edema ABDOMEN:abdomen soft, non-tender and normal bowel sounds MUSCULOSKELETAL:no cyanosis of digits and no clubbing  NEURO: alert & oriented x 3 with fluent speech, no focal motor/sensory deficits EXTREMITIES: No lower extremity edema BREAST: No palpable masses or nodules in either right or left breasts. No palpable axillary supraclavicular or infraclavicular adenopathy no breast tenderness or nipple discharge. (exam performed in the presence of a chaperone)  LABORATORY DATA:  I have reviewed the data as listed   Chemistry      Component Value Date/Time   NA 142 07/31/2014 1416   NA 138 07/04/2012 1335   K 4.1 07/31/2014 1416   K 3.5 07/04/2012 1335   CL 104 07/04/2012 1335   CL 107 01/18/2012 1314   CO2 23 07/31/2014 1416   CO2 23 07/04/2012 1335   BUN 12.4 07/31/2014 1416   BUN 13 07/04/2012 1335   CREATININE 0.9 07/31/2014 1416   CREATININE 0.96 07/04/2012 1335      Component Value Date/Time   CALCIUM 8.9 07/31/2014 1416   CALCIUM 9.4 07/04/2012 1335   ALKPHOS 74 07/31/2014 1416   ALKPHOS 94 07/28/2011 1615   AST 22 07/31/2014 1416   AST 27 07/28/2011 1615   ALT 23 07/31/2014 1416   ALT 29 07/28/2011 1615   BILITOT 0.66 07/31/2014 1416   BILITOT 0.9 07/28/2011 1615       Lab Results  Component Value Date   WBC 5.0 07/31/2014   HGB 12.7 07/31/2014   HCT 38.9 07/31/2014   MCV 92.6 07/31/2014   PLT 193 07/31/2014   NEUTROABS 3.1 07/31/2014      ASSESSMENT & PLAN:  Primary cancer of upper inner quadrant of left female breast Left breast invasive ductal carcinoma, 1.8 cm, T1 C. N0 M0 stage IA ER percent, PR negative, HER-2 negative, grade 2, Ki-67 90%, 3 SLN negative, Oncotype DX recurrence score 18, percent risk of recurrence, status post radiation therapy, Arimidex 1 mg daily was started August 2013 later switched to exemestane then switched to Femara and then switch to tamoxifen and again switched back to Anastrozole Nov 2015.  Anastrozole toxicities: 1. Itching of the skin: in spite of discontinuing anastrozole to 3 months the skin itching has not improved. So she resumed anti-estrogen therapy.   2. Bone density December 2015 revealed a T score of -0.9, normal bone density 3. Fatigue and weight gain: I suspect it is related to lack of physical exercise.  Dizziness and neck pain: Much improved neck pain. Weight gain issues: I encouraged  her to join weight loss programs. Patient [facility] going to [facility].  Breast Cancer Surveillance: 1. Breast exam 05/23/2015: Normal 2. Mammogram to be done on 05/30/2015 Return to clinic in 6 months for follow-up.  No orders of the defined types were placed in this encounter.   The patient has a good understanding of the overall plan. she agrees with it. she will call with any problems that may develop before the next visit here.   Rulon Eisenmenger, MD 05/23/2015
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
## 2015-05-30 ENCOUNTER — Ambulatory Visit
Admission: RE | Admit: 2015-05-30 | Discharge: 2015-05-30 | Disposition: A | Discharge status: Home / Self Care | Payer: Medicare Other | Source: Ambulatory Visit | Attending: Hematology and Oncology | Admitting: Hematology and Oncology

## 2015-05-30 DIAGNOSIS — Z853 Personal history of malignant neoplasm of breast: Secondary | ICD-10-CM

## 2015-05-30 DIAGNOSIS — R928 Other abnormal and inconclusive findings on diagnostic imaging of breast: Secondary | ICD-10-CM | POA: Diagnosis not present

## 2015-09-11 DIAGNOSIS — R8299 Other abnormal findings in urine: Secondary | ICD-10-CM | POA: Diagnosis not present

## 2015-09-11 DIAGNOSIS — E559 Vitamin D deficiency, unspecified: Secondary | ICD-10-CM | POA: Diagnosis not present

## 2015-09-11 DIAGNOSIS — N390 Urinary tract infection, site not specified: Secondary | ICD-10-CM | POA: Diagnosis not present

## 2015-09-11 DIAGNOSIS — E784 Other hyperlipidemia: Secondary | ICD-10-CM | POA: Diagnosis not present

## 2015-09-11 DIAGNOSIS — I10 Essential (primary) hypertension: Secondary | ICD-10-CM | POA: Diagnosis not present

## 2015-09-11 DIAGNOSIS — R7309 Other abnormal glucose: Secondary | ICD-10-CM | POA: Diagnosis not present

## 2015-09-18 DIAGNOSIS — F329 Major depressive disorder, single episode, unspecified: Secondary | ICD-10-CM | POA: Diagnosis not present

## 2015-09-18 DIAGNOSIS — J309 Allergic rhinitis, unspecified: Secondary | ICD-10-CM | POA: Diagnosis not present

## 2015-09-18 DIAGNOSIS — R7309 Other abnormal glucose: Secondary | ICD-10-CM | POA: Diagnosis not present

## 2015-09-18 DIAGNOSIS — E784 Other hyperlipidemia: Secondary | ICD-10-CM | POA: Diagnosis not present

## 2015-09-18 DIAGNOSIS — I10 Essential (primary) hypertension: Secondary | ICD-10-CM | POA: Diagnosis not present

## 2015-09-18 DIAGNOSIS — Z0000 Encounter for general adult medical examination without abnormal findings: Secondary | ICD-10-CM | POA: Diagnosis not present

## 2015-09-18 DIAGNOSIS — Z6837 Body mass index (BMI) 37.0-37.9, adult: Secondary | ICD-10-CM | POA: Diagnosis not present

## 2015-09-18 DIAGNOSIS — N63 Unspecified lump in breast: Secondary | ICD-10-CM | POA: Diagnosis not present

## 2015-09-18 DIAGNOSIS — E668 Other obesity: Secondary | ICD-10-CM | POA: Diagnosis not present

## 2015-09-18 DIAGNOSIS — G2581 Restless legs syndrome: Secondary | ICD-10-CM | POA: Diagnosis not present

## 2015-09-18 DIAGNOSIS — G43909 Migraine, unspecified, not intractable, without status migrainosus: Secondary | ICD-10-CM | POA: Diagnosis not present

## 2015-11-12 DIAGNOSIS — Z6838 Body mass index (BMI) 38.0-38.9, adult: Secondary | ICD-10-CM | POA: Diagnosis not present

## 2015-11-12 DIAGNOSIS — I10 Essential (primary) hypertension: Secondary | ICD-10-CM | POA: Diagnosis not present

## 2015-11-12 DIAGNOSIS — J0100 Acute maxillary sinusitis, unspecified: Secondary | ICD-10-CM | POA: Diagnosis not present

## 2015-11-12 DIAGNOSIS — J302 Other seasonal allergic rhinitis: Secondary | ICD-10-CM | POA: Diagnosis not present

## 2015-11-12 DIAGNOSIS — Z23 Encounter for immunization: Secondary | ICD-10-CM | POA: Diagnosis not present

## 2016-02-14 DIAGNOSIS — R05 Cough: Secondary | ICD-10-CM | POA: Diagnosis not present

## 2016-02-14 DIAGNOSIS — Z6838 Body mass index (BMI) 38.0-38.9, adult: Secondary | ICD-10-CM | POA: Diagnosis not present

## 2016-02-14 DIAGNOSIS — J0100 Acute maxillary sinusitis, unspecified: Secondary | ICD-10-CM | POA: Diagnosis not present

## 2016-03-04 DIAGNOSIS — R05 Cough: Secondary | ICD-10-CM | POA: Diagnosis not present

## 2016-03-04 DIAGNOSIS — J45909 Unspecified asthma, uncomplicated: Secondary | ICD-10-CM | POA: Diagnosis not present

## 2016-03-04 DIAGNOSIS — Z6837 Body mass index (BMI) 37.0-37.9, adult: Secondary | ICD-10-CM | POA: Diagnosis not present

## 2016-03-19 DIAGNOSIS — R05 Cough: Secondary | ICD-10-CM | POA: Diagnosis not present

## 2016-03-19 DIAGNOSIS — J3081 Allergic rhinitis due to animal (cat) (dog) hair and dander: Secondary | ICD-10-CM | POA: Diagnosis not present

## 2016-03-19 DIAGNOSIS — J301 Allergic rhinitis due to pollen: Secondary | ICD-10-CM | POA: Diagnosis not present

## 2016-03-19 DIAGNOSIS — J3089 Other allergic rhinitis: Secondary | ICD-10-CM | POA: Diagnosis not present

## 2016-04-13 ENCOUNTER — Other Ambulatory Visit: Payer: Self-pay | Admitting: *Deleted

## 2016-04-13 ENCOUNTER — Encounter: Payer: Self-pay | Admitting: Podiatry

## 2016-04-13 ENCOUNTER — Ambulatory Visit (INDEPENDENT_AMBULATORY_CARE_PROVIDER_SITE_OTHER): Payer: Medicare Other | Admitting: Podiatry

## 2016-04-13 VITALS — BP 123/68 | HR 73 | Resp 16

## 2016-04-13 DIAGNOSIS — L309 Dermatitis, unspecified: Secondary | ICD-10-CM | POA: Diagnosis not present

## 2016-04-13 DIAGNOSIS — M779 Enthesopathy, unspecified: Secondary | ICD-10-CM | POA: Diagnosis not present

## 2016-04-13 MED ORDER — TRIAMCINOLONE ACETONIDE 10 MG/ML IJ SUSP
10.0000 mg | Freq: Once | INTRAMUSCULAR | Status: AC
  Administered 2016-04-13: 10 mg

## 2016-04-13 NOTE — Progress Notes (Signed)
   Subjective:    Patient ID: Katelyn Walsh, female    DOB: Jul 16, 1950, 66 y.o.   MRN: [id number]  HPI  Chief Complaint  Patient presents with  . Callouses    Heel bilateral - callused, fissured skin, bleed occasionally, soreness when they are deep, tried lotions-no help       Review of Systems  HENT: Positive for hearing loss.   Eyes: Positive for redness and itching.  Endocrine: Positive for heat intolerance.  Musculoskeletal: Positive for back pain.  Hematological: Bruises/bleeds easily.  All other systems reviewed and are negative.      Objective:   Physical Exam        Assessment & Plan:

## 2016-04-13 NOTE — Patient Instructions (Signed)

## 2016-04-13 NOTE — Progress Notes (Signed)
Subjective:     Patient ID: Katelyn Walsh, female   DOB: 02-12-1951, 66 y.o.   MRN: [id number]  HPI patient presents stating she gets significant calluses on the medial side of both heels and they crack and can become painful. Also complaining of pain in the outside of the right foot and states it's been very inflamed   Review of Systems  All other systems reviewed and are negative.      Objective:   Physical Exam  Constitutional: She is oriented to person, place, and time.  Cardiovascular: Intact distal pulses.   Musculoskeletal: Normal range of motion.  Neurological: She is oriented to person, place, and time.  Skin: Skin is warm.  Nursing note and vitals reviewed.  neurovascular status intact muscle strength was adequate range of motion within normal limits with patient found to have cracks and fissures in the medial heel bilateral first metatarsal bilateral. Patient does have dry skin and is found to have good digital perfusion and is well oriented 3. Patient is found to have quite a bit of inflammation on the peroneal insertion fifth metatarsal base right with pain     Assessment:     Dermatitis-like condition with chronic fissuring and keratotic lesion formation. Tendinitis of the peroneal tendon insertion right    Plan:     H&P conditions reviewed and debridement accomplished. I also went ahead and injected the peroneal insertion right 3 mg Kenalog 5 mill grams Xylocaine and applied fascial brace to lift up the side. I will start the patient on medication consisting of urea cream to try to reduce the thickness and also she will use Vaseline under occlusion

## 2016-04-21 DIAGNOSIS — L0231 Cutaneous abscess of buttock: Secondary | ICD-10-CM | POA: Diagnosis not present

## 2016-04-22 DIAGNOSIS — L0231 Cutaneous abscess of buttock: Secondary | ICD-10-CM | POA: Diagnosis not present

## 2016-04-30 ENCOUNTER — Telehealth: Payer: Self-pay | Admitting: Hematology and Oncology

## 2016-04-30 NOTE — Telephone Encounter (Signed)
sw pt to confirm r/s appt due to MD call day. Pt requested 4/19 at 1130 am

## 2016-05-21 ENCOUNTER — Ambulatory Visit: Payer: Medicare Other | Admitting: Hematology and Oncology

## 2016-06-11 ENCOUNTER — Encounter: Payer: Self-pay | Admitting: Hematology and Oncology

## 2016-06-11 ENCOUNTER — Ambulatory Visit (HOSPITAL_BASED_OUTPATIENT_CLINIC_OR_DEPARTMENT_OTHER): Payer: Medicare Other | Admitting: Hematology and Oncology

## 2016-06-11 DIAGNOSIS — L299 Pruritus, unspecified: Secondary | ICD-10-CM

## 2016-06-11 DIAGNOSIS — R635 Abnormal weight gain: Secondary | ICD-10-CM | POA: Diagnosis not present

## 2016-06-11 DIAGNOSIS — M542 Cervicalgia: Secondary | ICD-10-CM | POA: Diagnosis not present

## 2016-06-11 DIAGNOSIS — C50212 Malignant neoplasm of upper-inner quadrant of left female breast: Secondary | ICD-10-CM | POA: Diagnosis not present

## 2016-06-11 MED ORDER — ANASTROZOLE 1 MG PO TABS: 1.0000 mg | ORAL_TABLET | Freq: Every day | ORAL | 3 refills | Status: DC

## 2016-06-11 MED ORDER — HYDROXYZINE HCL 25 MG PO TABS: 25.0000 mg | ORAL_TABLET | Freq: Three times a day (TID) | ORAL | 6 refills | Status: DC | PRN

## 2016-06-11 MED ORDER — AMOXICILLIN 500 MG PO TABS: 500.0000 mg | ORAL_TABLET | Freq: Two times a day (BID) | ORAL | 0 refills | Status: DC

## 2016-06-11 NOTE — Progress Notes (Signed)
Patient Care Team: Velna Hatchet, MD as PCP - General (Internal Medicine) Lavone Orn, MD as Attending Physician (Internal Medicine) Harlin Heys, MD as Referring Physician (Family Medicine)  DIAGNOSIS:  Encounter Diagnosis  Name Primary?  . Primary cancer of upper inner quadrant of left female breast ([name])     SUMMARY OF ONCOLOGIC HISTORY:   Primary cancer of upper inner quadrant of left female breast ([name])   07/31/2011 Surgery    left breast lumpectomy: IDC grade to 1.8 cm, no LV are, with DCIS, 3 sentinel nodes negative, grade 2, ER 100%, PR 0%, HER-2 negative, Ki-67 9%, T1 cN0 M0 stage IA, Oncotype score 18, 11% ROR low risk      10/14/2011 - 11/17/2011 Radiation Therapy    Adjuvant Radiation therapy      10/14/2011 -  Anti-estrogen oral therapy    Arimidex 1 mg daily developed a change switched to Femara and exemestane and then took tamoxifen and now back to Arimidex from 01/09/2014 to complete August 2018       CHIEF COMPLIANT: Follow-up on Arimidex therapy  INTERVAL HISTORY: Katelyn Walsh is a 66 year old with above-mentioned history left breast cancer treated with lumpectomy followed by radiation and is currently on Arimidex therapy. She completes 5 years of therapy by this August. She wishes to continue it for a few more years. Recently she had been dealing with upper respiratory infections and has recovered from those.  REVIEW OF SYSTEMS:   Constitutional: Denies fevers, chills or abnormal weight loss Eyes: Denies blurriness of vision Ears, nose, mouth, throat, and face: Denies mucositis or sore throat Respiratory: Recent multiple upper respiratory infections Cardiovascular: Denies palpitation, chest discomfort Gastrointestinal:  Denies nausea, heartburn or change in bowel habits Skin: Denies abnormal skin rashes Lymphatics: Denies new lymphadenopathy or easy bruising Neurological:Denies numbness, tingling or new weaknesses Behavioral/Psych: Mood is stable, no  new changes  Extremities: No lower extremity edema Breast:  denies any pain or lumps or nodules in either breasts All other systems were reviewed with the patient and are negative.  I have reviewed the past medical history, past surgical history, social history and family history with the patient and they are unchanged from previous note.  ALLERGIES:  is allergic to dust mite extract and pollen extract.  MEDICATIONS:  Current Outpatient Prescriptions  Medication Sig Dispense Refill  . amoxicillin (AMOXIL) 500 MG tablet Take 1 tablet (500 mg total) by mouth 2 (two) times daily. 14 tablet 0  . anastrozole (ARIMIDEX) 1 MG tablet Take 1 tablet (1 mg total) by mouth daily. 90 tablet 3  . aspirin EC 81 MG tablet Take 81 mg by mouth daily.    . cetirizine (ZYRTEC) 10 MG tablet Take 10 mg by mouth daily.    . Cholecalciferol (VITAMIN D3) 2000 UNITS TABS Take 1 capsule by mouth daily.    . citalopram (CELEXA) 40 MG tablet   3  . hydrOXYzine (ATARAX/VISTARIL) 25 MG tablet Take 1 tablet (25 mg total) by mouth 3 (three) times daily as needed. 30 tablet 6  . losartan (COZAAR) 100 MG tablet Take 100 mg by mouth every morning.     . montelukast (SINGULAIR) 10 MG tablet Take 10 mg by mouth at bedtime.    . Probiotic Product (ALIGN) 4 MG CAPS Take 1 capsule by mouth daily with breakfast.     . rOPINIRole (REQUIP) 1 MG tablet Take 1 mg by mouth 3 (three) times daily.    . simvastatin (ZOCOR) 40 MG tablet Take  40 mg by mouth at bedtime.    . topiramate (TOPAMAX) 100 MG tablet Take 100 mg by mouth 2 (two) times daily.     No current facility-administered medications for this visit.     PHYSICAL EXAMINATION: ECOG PERFORMANCE STATUS: 1 - Symptomatic but completely ambulatory  Vitals:   06/11/16 1207  BP: 140/75  Pulse: 73  Temp: 98.2 F (36.8 C)   Filed Weights   06/11/16 1207  Weight: 227 lb 14.4 oz (103.4 kg)    GENERAL:alert, no distress and comfortable SKIN: skin color, texture, turgor are  normal, no rashes or significant lesions EYES: normal, Conjunctiva are pink and non-injected, sclera clear OROPHARYNX:no exudate, no erythema and lips, buccal mucosa, and tongue normal  NECK: supple, thyroid normal size, non-tender, without nodularity LYMPH:  no palpable lymphadenopathy in the cervical, axillary or inguinal LUNGS: clear to auscultation and percussion with normal breathing effort HEART: regular rate & rhythm and no murmurs and no lower extremity edema ABDOMEN:abdomen soft, non-tender and normal bowel sounds MUSCULOSKELETAL:no cyanosis of digits and no clubbing  NEURO: alert & oriented x 3 with fluent speech, no focal motor/sensory deficits EXTREMITIES: No lower extremity edema BREAST: No palpable masses or nodules in either right or left breasts. No palpable axillary supraclavicular or infraclavicular adenopathy no breast tenderness or nipple discharge. (exam performed in the presence of a chaperone)  LABORATORY DATA:  I have reviewed the data as listed   Chemistry      Component Value Date/Time   NA 142 07/31/2014 1416   K 4.1 07/31/2014 1416   CL 104 07/04/2012 1335   CL 107 01/18/2012 1314   CO2 23 07/31/2014 1416   BUN 12.4 07/31/2014 1416   CREATININE 0.9 07/31/2014 1416      Component Value Date/Time   CALCIUM 8.9 07/31/2014 1416   ALKPHOS 74 07/31/2014 1416   AST 22 07/31/2014 1416   ALT 23 07/31/2014 1416   BILITOT 0.66 07/31/2014 1416       Lab Results  Component Value Date   WBC 5.0 07/31/2014   HGB 12.7 07/31/2014   HCT 38.9 07/31/2014   MCV 92.6 07/31/2014   PLT 193 07/31/2014   NEUTROABS 3.1 07/31/2014    ASSESSMENT & PLAN:  Primary cancer of upper inner quadrant of left female breast Left breast invasive ductal carcinoma, 1.8 cm, T1 C. N0 M0 stage IA ER percent, PR negative, HER-2 negative, grade 2, Ki-67 90%, 3 SLN negative, Oncotype DX recurrence score 18, percent risk of recurrence, status post radiation therapy, Arimidex 1 mg daily was  started August 2013 later switched to exemestane then switched to Femara and then switch to tamoxifen and again switched back to Anastrozole Nov 2015.  Anastrozole toxicities: 1. Itching of the skin: in spite of discontinuing anastrozole to 3 months the skin itching has not improved. So she resumed anti-estrogen therapy.   2. Bone density December 2015 revealed a T score of -0.9, normal bone density 3. Fatigue and weight gain: I suspect it is related to lack of physical exercise.  Dizziness and neck pain: Much improved neck pain. Weight gain issues: I encouraged her to join weight loss programs. Patient loves going to [facility]. Frequent upper respiratory infections Renewed her prescription for anastrozole  Breast Cancer Surveillance: 1. Breast exam 06/11/2016: Normal 2. Mammogram  05/30/2015: No evidence of malignancy breast density category B Return to clinic in 1 year for follow-up.  I spent 25 minutes talking to the patient of which more than half  was spent in counseling and coordination of care.  No orders of the defined types were placed in this encounter.  The patient has a good understanding of the overall plan. she agrees with it. she will call with any problems that may develop before the next visit here.   Rulon Eisenmenger, MD 06/11/16

## 2016-06-11 NOTE — Assessment & Plan Note (Signed)
Left breast invasive ductal carcinoma, 1.8 cm, T1 C. N0 M0 stage IA ER percent, PR negative, HER-2 negative, grade 2, Ki-67 90%, 3 SLN negative, Oncotype DX recurrence score 18, percent risk of recurrence, status post radiation therapy, Arimidex 1 mg daily was started August 2013 later switched to exemestane then switched to Femara and then switch to tamoxifen and again switched back to Anastrozole Nov 2015.  Anastrozole toxicities: 1. Itching of the skin: in spite of discontinuing anastrozole to 3 months the skin itching has not improved. So she resumed anti-estrogen therapy.   2. Bone density December 2015 revealed a T score of -0.9, normal bone density 3. Fatigue and weight gain: I suspect it is related to lack of physical exercise.  Dizziness and neck pain: Much improved neck pain. Weight gain issues: I encouraged her to join weight loss programs. Patient loves going to [facility].  Breast Cancer Surveillance: 1. Breast exam 06/11/2016: Normal 2. Mammogram  05/30/2015: No evidence of malignancy breast density category B Return to clinic in 1 year for follow-up.

## 2016-06-15 DIAGNOSIS — J301 Allergic rhinitis due to pollen: Secondary | ICD-10-CM | POA: Diagnosis not present

## 2016-06-15 DIAGNOSIS — J3089 Other allergic rhinitis: Secondary | ICD-10-CM | POA: Diagnosis not present

## 2016-06-15 DIAGNOSIS — J3081 Allergic rhinitis due to animal (cat) (dog) hair and dander: Secondary | ICD-10-CM | POA: Diagnosis not present

## 2016-06-15 DIAGNOSIS — H1045 Other chronic allergic conjunctivitis: Secondary | ICD-10-CM | POA: Diagnosis not present

## 2016-06-15 DIAGNOSIS — R05 Cough: Secondary | ICD-10-CM | POA: Diagnosis not present

## 2016-08-10 ENCOUNTER — Ambulatory Visit (INDEPENDENT_AMBULATORY_CARE_PROVIDER_SITE_OTHER): Payer: Medicare Other | Admitting: Podiatry

## 2016-08-10 ENCOUNTER — Encounter: Payer: Self-pay | Admitting: Podiatry

## 2016-08-10 DIAGNOSIS — B079 Viral wart, unspecified: Secondary | ICD-10-CM | POA: Diagnosis not present

## 2016-08-10 DIAGNOSIS — M779 Enthesopathy, unspecified: Secondary | ICD-10-CM

## 2016-08-10 MED ORDER — TRIAMCINOLONE ACETONIDE 10 MG/ML IJ SUSP
10.0000 mg | Freq: Once | INTRAMUSCULAR | Status: AC
  Administered 2016-08-10: 10 mg

## 2016-08-10 NOTE — Progress Notes (Signed)
Subjective:    Patient ID: Katelyn Walsh, female   DOB: 66 y.o.   MRN: [id number]   HPI patient presents stating that the tendon on the outside seems better but her his ankle seems to hurt him and on the left foot the lesion on the back of the heel is still bothering him with the other lesions doing better    ROS      Objective:  Physical Exam neurovascular status intact with quite a bit of discomfort sinus tarsi right with inflammation fluid of the joint and a lesion on the posterior lateral aspect of the heel that upon debridement shows pinpoint bleeding     Assessment:  Inflammatory capsulitis right sinus tarsi and lesion formation consistent with probable verruca plantaris left       Plan:  H&P both conditions discussed. Injected the right capsule 3 mg Kenalog 5 mill grams Xylocaine and for the left I debrided the lesion fully with sterile instrumentation and then went ahead today and I applied a immune agent to try to stimulate response and applied sterile dressings

## 2016-08-17 ENCOUNTER — Other Ambulatory Visit: Payer: Self-pay | Admitting: Hematology and Oncology

## 2016-08-17 DIAGNOSIS — Z853 Personal history of malignant neoplasm of breast: Secondary | ICD-10-CM

## 2016-08-20 ENCOUNTER — Ambulatory Visit
Admission: RE | Admit: 2016-08-20 | Discharge: 2016-08-20 | Disposition: A | Discharge status: Home / Self Care | Payer: Medicare Other | Source: Ambulatory Visit | Attending: Hematology and Oncology | Admitting: Hematology and Oncology

## 2016-08-20 DIAGNOSIS — R928 Other abnormal and inconclusive findings on diagnostic imaging of breast: Secondary | ICD-10-CM | POA: Diagnosis not present

## 2016-08-20 DIAGNOSIS — Z853 Personal history of malignant neoplasm of breast: Secondary | ICD-10-CM

## 2016-09-01 DIAGNOSIS — L0889 Other specified local infections of the skin and subcutaneous tissue: Secondary | ICD-10-CM | POA: Diagnosis not present

## 2016-09-01 DIAGNOSIS — Z6837 Body mass index (BMI) 37.0-37.9, adult: Secondary | ICD-10-CM | POA: Diagnosis not present

## 2016-09-01 DIAGNOSIS — I10 Essential (primary) hypertension: Secondary | ICD-10-CM | POA: Diagnosis not present

## 2016-09-03 DIAGNOSIS — K611 Rectal abscess: Secondary | ICD-10-CM | POA: Diagnosis not present

## 2016-09-16 ENCOUNTER — Ambulatory Visit (INDEPENDENT_AMBULATORY_CARE_PROVIDER_SITE_OTHER): Payer: Medicare Other | Admitting: Podiatry

## 2016-09-16 DIAGNOSIS — M779 Enthesopathy, unspecified: Secondary | ICD-10-CM | POA: Diagnosis not present

## 2016-09-16 DIAGNOSIS — B079 Viral wart, unspecified: Secondary | ICD-10-CM | POA: Diagnosis not present

## 2016-09-16 NOTE — Progress Notes (Signed)
Subjective:    Patient ID: Katelyn Walsh, female   DOB: 66 y.o.   MRN: [id number]   HPI patient presents stating I am still having some inflammation in my right foot but improved and I do have lesions on my left that are still present and I know I may need eventually have surgery on    ROS      Objective:  Physical Exam neurovascular status intact with patient's right sinus tarsi quite a bit improved with mild discomfort noted on the left foot lateral side there is a lesion formation that upon debridement shows pinpoint bleeding     Assessment:   Improving from inflammatory capsulitis right with keratotic lesion consistent with probable verruca plantaris left      Plan:   H&P condition reviewed debrided lesion on the left applied immune agent to create response and for the right discussed continued shoe gear modifications and reappoint to recheck

## 2016-10-05 DIAGNOSIS — K6289 Other specified diseases of anus and rectum: Secondary | ICD-10-CM | POA: Diagnosis not present

## 2016-10-15 ENCOUNTER — Ambulatory Visit (INDEPENDENT_AMBULATORY_CARE_PROVIDER_SITE_OTHER): Payer: Medicare Other | Admitting: Podiatry

## 2016-10-15 DIAGNOSIS — B079 Viral wart, unspecified: Secondary | ICD-10-CM | POA: Diagnosis not present

## 2016-10-15 DIAGNOSIS — M722 Plantar fascial fibromatosis: Secondary | ICD-10-CM

## 2016-10-15 MED ORDER — TRIAMCINOLONE ACETONIDE 10 MG/ML IJ SUSP
10.0000 mg | Freq: Once | INTRAMUSCULAR | Status: AC
  Administered 2016-10-15: 10 mg

## 2016-10-15 NOTE — Progress Notes (Signed)
Subjective:    Patient ID: Katelyn Walsh, female   DOB: 66 y.o.   MRN: [id number]   HPI patient states that the right heel has really started to get sore and she is due to go to like time: 3 weeks    ROS      Objective:  Physical Exam neurovascular status intact negative Homans sign was noted with patient found to have exquisite tenderness plantar aspect right heel that's worse when it and up in the morning and after periods of sitting     Assessment:   Acute plantar fasciitis right with inflammation fluid buildup      Plan:    H&P condition reviewed and today I went ahead and injected the plantar fascia 3 mg Kenalog 5 mg Xylocaine and I applied night splint with all instructions on usage along with aggressive ice therapy. Reappoint in 2 weeks to recheck  X-rays indicate small spur with no indications of stress fracture or advanced arthritis

## 2016-10-29 ENCOUNTER — Ambulatory Visit (INDEPENDENT_AMBULATORY_CARE_PROVIDER_SITE_OTHER): Payer: Medicare Other | Admitting: Podiatry

## 2016-10-29 ENCOUNTER — Encounter: Payer: Self-pay | Admitting: Podiatry

## 2016-10-29 DIAGNOSIS — M722 Plantar fascial fibromatosis: Secondary | ICD-10-CM

## 2016-10-29 DIAGNOSIS — B079 Viral wart, unspecified: Secondary | ICD-10-CM | POA: Diagnosis not present

## 2016-10-29 MED ORDER — TRIAMCINOLONE ACETONIDE 10 MG/ML IJ SUSP
10.0000 mg | Freq: Once | INTRAMUSCULAR | Status: AC
  Administered 2016-10-29: 10 mg

## 2016-10-29 NOTE — Progress Notes (Signed)
Subjective:    Patient ID: Katelyn Walsh, female   DOB: 66 y.o.   MRN: [id number]   HPI patient states I'm still having pain in his right heel and I still have a lesion on the outside of my left foot even though it has improved some    ROS      Objective:  Physical Exam neurovascular status intact with inflammation of the right plantar heel at the insertional point with a keratotic lesion on the lateral plantar aspect of the left heel that upon debridement shows pinpoint bleeding and is painful to lateral pressure     Assessment:    Planter fasciitis right still present and verruca plantaris left     Plan:    H&P both conditions discussed and injected the right plantar fascia 3 [place] Kenalog 5 mill grams Xylocaine and for the left debrider the lesion fully and apply chemical agent to create an immune response was sterile dressing. Reappoint to recheck

## 2016-11-02 ENCOUNTER — Other Ambulatory Visit: Payer: Self-pay | Admitting: General Surgery

## 2016-11-02 DIAGNOSIS — K6289 Other specified diseases of anus and rectum: Secondary | ICD-10-CM

## 2016-11-25 DIAGNOSIS — H2513 Age-related nuclear cataract, bilateral: Secondary | ICD-10-CM | POA: Diagnosis not present

## 2016-12-01 DIAGNOSIS — Z23 Encounter for immunization: Secondary | ICD-10-CM | POA: Diagnosis not present

## 2016-12-01 DIAGNOSIS — E7849 Other hyperlipidemia: Secondary | ICD-10-CM | POA: Diagnosis not present

## 2016-12-01 DIAGNOSIS — I10 Essential (primary) hypertension: Secondary | ICD-10-CM | POA: Diagnosis not present

## 2016-12-01 DIAGNOSIS — R7309 Other abnormal glucose: Secondary | ICD-10-CM | POA: Diagnosis not present

## 2016-12-01 DIAGNOSIS — Z780 Asymptomatic menopausal state: Secondary | ICD-10-CM | POA: Diagnosis not present

## 2016-12-01 DIAGNOSIS — E559 Vitamin D deficiency, unspecified: Secondary | ICD-10-CM | POA: Diagnosis not present

## 2016-12-01 DIAGNOSIS — Z0000 Encounter for general adult medical examination without abnormal findings: Secondary | ICD-10-CM | POA: Diagnosis not present

## 2016-12-08 DIAGNOSIS — Z853 Personal history of malignant neoplasm of breast: Secondary | ICD-10-CM | POA: Diagnosis not present

## 2016-12-08 DIAGNOSIS — Z0000 Encounter for general adult medical examination without abnormal findings: Secondary | ICD-10-CM | POA: Diagnosis not present

## 2016-12-08 DIAGNOSIS — E7849 Other hyperlipidemia: Secondary | ICD-10-CM | POA: Diagnosis not present

## 2016-12-08 DIAGNOSIS — G43909 Migraine, unspecified, not intractable, without status migrainosus: Secondary | ICD-10-CM | POA: Diagnosis not present

## 2016-12-08 DIAGNOSIS — L0889 Other specified local infections of the skin and subcutaneous tissue: Secondary | ICD-10-CM | POA: Diagnosis not present

## 2016-12-08 DIAGNOSIS — I10 Essential (primary) hypertension: Secondary | ICD-10-CM | POA: Diagnosis not present

## 2016-12-08 DIAGNOSIS — M79671 Pain in right foot: Secondary | ICD-10-CM | POA: Diagnosis not present

## 2016-12-08 DIAGNOSIS — F330 Major depressive disorder, recurrent, mild: Secondary | ICD-10-CM | POA: Diagnosis not present

## 2016-12-08 DIAGNOSIS — I878 Other specified disorders of veins: Secondary | ICD-10-CM | POA: Diagnosis not present

## 2016-12-08 DIAGNOSIS — Z6837 Body mass index (BMI) 37.0-37.9, adult: Secondary | ICD-10-CM | POA: Diagnosis not present

## 2016-12-08 DIAGNOSIS — Z1389 Encounter for screening for other disorder: Secondary | ICD-10-CM | POA: Diagnosis not present

## 2016-12-15 ENCOUNTER — Ambulatory Visit
Admission: RE | Admit: 2016-12-15 | Discharge: 2016-12-15 | Disposition: A | Discharge status: Home / Self Care | Payer: Medicare Other | Source: Ambulatory Visit | Attending: General Surgery | Admitting: General Surgery

## 2016-12-15 DIAGNOSIS — K6289 Other specified diseases of anus and rectum: Secondary | ICD-10-CM

## 2016-12-15 DIAGNOSIS — D259 Leiomyoma of uterus, unspecified: Secondary | ICD-10-CM | POA: Diagnosis not present

## 2016-12-15 MED ORDER — GADOBENATE DIMEGLUMINE 529 MG/ML IV SOLN
20.0000 mL | Freq: Once | INTRAVENOUS | Status: AC | PRN
  Administered 2016-12-15: 20 mL via INTRAVENOUS

## 2016-12-21 DIAGNOSIS — M722 Plantar fascial fibromatosis: Secondary | ICD-10-CM | POA: Diagnosis not present

## 2016-12-22 DIAGNOSIS — J301 Allergic rhinitis due to pollen: Secondary | ICD-10-CM | POA: Diagnosis not present

## 2016-12-22 DIAGNOSIS — J3089 Other allergic rhinitis: Secondary | ICD-10-CM | POA: Diagnosis not present

## 2016-12-22 DIAGNOSIS — J019 Acute sinusitis, unspecified: Secondary | ICD-10-CM | POA: Diagnosis not present

## 2016-12-22 DIAGNOSIS — J3081 Allergic rhinitis due to animal (cat) (dog) hair and dander: Secondary | ICD-10-CM | POA: Diagnosis not present

## 2017-01-12 DIAGNOSIS — K6289 Other specified diseases of anus and rectum: Secondary | ICD-10-CM | POA: Diagnosis not present

## 2017-01-13 DIAGNOSIS — M722 Plantar fascial fibromatosis: Secondary | ICD-10-CM | POA: Diagnosis not present

## 2017-01-20 DIAGNOSIS — M722 Plantar fascial fibromatosis: Secondary | ICD-10-CM | POA: Diagnosis not present

## 2017-01-26 ENCOUNTER — Telehealth: Payer: Self-pay

## 2017-01-26 DIAGNOSIS — M722 Plantar fascial fibromatosis: Secondary | ICD-10-CM | POA: Diagnosis not present

## 2017-01-26 NOTE — Telephone Encounter (Signed)
Pt was wanting an rx for [employer] for bra and prosthesis. She wants to come by and pick it up.   Please call pt when it is ready.

## 2017-01-27 ENCOUNTER — Telehealth: Payer: Self-pay

## 2017-01-27 NOTE — Telephone Encounter (Signed)
Pt called for a script for breast prothesis and bras and I explained to her at [facility] [place] at her fitting they would fax [place] a script or she could also pick one up from our office like she has in the past. No further questions.  Cyndia Bent RN

## 2017-01-27 NOTE — Telephone Encounter (Signed)
Will take care of.  Thank you!

## 2017-02-04 DIAGNOSIS — M722 Plantar fascial fibromatosis: Secondary | ICD-10-CM | POA: Diagnosis not present

## 2017-02-18 DIAGNOSIS — M722 Plantar fascial fibromatosis: Secondary | ICD-10-CM | POA: Diagnosis not present

## 2017-03-03 DIAGNOSIS — M722 Plantar fascial fibromatosis: Secondary | ICD-10-CM | POA: Diagnosis not present

## 2017-03-17 DIAGNOSIS — M722 Plantar fascial fibromatosis: Secondary | ICD-10-CM | POA: Diagnosis not present

## 2017-03-25 DIAGNOSIS — M722 Plantar fascial fibromatosis: Secondary | ICD-10-CM | POA: Diagnosis not present

## 2017-03-26 DIAGNOSIS — M722 Plantar fascial fibromatosis: Secondary | ICD-10-CM | POA: Diagnosis not present

## 2017-03-29 DIAGNOSIS — M722 Plantar fascial fibromatosis: Secondary | ICD-10-CM | POA: Diagnosis not present

## 2017-03-31 DIAGNOSIS — M722 Plantar fascial fibromatosis: Secondary | ICD-10-CM | POA: Diagnosis not present

## 2017-04-02 DIAGNOSIS — M722 Plantar fascial fibromatosis: Secondary | ICD-10-CM | POA: Diagnosis not present

## 2017-04-05 DIAGNOSIS — M722 Plantar fascial fibromatosis: Secondary | ICD-10-CM | POA: Diagnosis not present

## 2017-04-06 DIAGNOSIS — M722 Plantar fascial fibromatosis: Secondary | ICD-10-CM | POA: Diagnosis not present

## 2017-06-08 ENCOUNTER — Inpatient Hospital Stay: Payer: Medicare Other | Attending: Hematology and Oncology | Admitting: Hematology and Oncology

## 2017-06-08 ENCOUNTER — Telehealth: Payer: Self-pay | Admitting: Hematology and Oncology

## 2017-06-08 DIAGNOSIS — Z79811 Long term (current) use of aromatase inhibitors: Secondary | ICD-10-CM | POA: Diagnosis not present

## 2017-06-08 DIAGNOSIS — C50212 Malignant neoplasm of upper-inner quadrant of left female breast: Secondary | ICD-10-CM | POA: Diagnosis not present

## 2017-06-08 MED ORDER — HYDROXYZINE HCL 25 MG PO TABS: 25.0000 mg | ORAL_TABLET | Freq: Three times a day (TID) | ORAL | 6 refills | Status: DC | PRN

## 2017-06-08 MED ORDER — LEVOCETIRIZINE DIHYDROCHLORIDE 5 MG PO TABS: 5.0000 mg | ORAL_TABLET | Freq: Every evening | ORAL | Status: AC

## 2017-06-08 MED ORDER — ANASTROZOLE 1 MG PO TABS: 1.0000 mg | ORAL_TABLET | Freq: Every day | ORAL | 3 refills | Status: DC

## 2017-06-08 MED ORDER — VITAMIN D3 125 MCG (5000 UT) PO TABS: 5000.0000 [IU] | ORAL_TABLET | Freq: Every day | ORAL | Status: DC

## 2017-06-08 NOTE — Assessment & Plan Note (Signed)
Left breast invasive ductal carcinoma, 1.8 cm, T1 C. N0 M0 stage IA ER percent, PR negative, HER-2 negative, grade 2, Ki-67 90%, 3 SLN negative, Oncotype DX recurrence score 18, percent risk of recurrence, status post radiation therapy, Arimidex 1 mg daily was started August 2013 later switched to exemestane then switched to Femara and then switch to tamoxifen and again switched back to Anastrozole Nov 2015.  Anastrozole toxicities: 1. Itching of the skin: in spite of discontinuing anastrozole to 3 months the skin itching has not improved. So she resumed anti-estrogen therapy.  2. Bone density December 2015 revealed a T score of -0.9, normal bone density 3. Fatigue and weight gain: I suspect it is related to lack of physical exercise.  Dizziness and neck pain: Much improved neck pain. Weight gain issues: I encouraged her to join weight loss programs. Patient loves going to [facility] Frequent upper respiratory infections Renewed her prescription for anastrozole  Breast Cancer Surveillance: 1. Breast exam 06/08/2017: Normal 2. Mammogram   6/28/2018No evidence of malignancy breast density category B 12/15/2016: MRI pelvis: Perianal fistulous  Return to clinic in 1 year for follow-up.

## 2017-06-08 NOTE — Progress Notes (Signed)
Patient Care Team: Velna Hatchet, MD as PCP - General (Internal Medicine) Lavone Orn, MD as Attending Physician (Internal Medicine) Harlin Heys, MD as Referring Physician (Family Medicine)  DIAGNOSIS:  Encounter Diagnosis  Name Primary?  . Primary cancer of upper inner quadrant of left female breast ([place])     SUMMARY OF ONCOLOGIC HISTORY:   Primary cancer of upper inner quadrant of left female breast ([place])   07/31/2011 Surgery    left breast lumpectomy: IDC grade to 1.8 cm, no LV are, with DCIS, 3 sentinel nodes negative, grade 2, ER 100%, PR 0%, HER-2 negative, Ki-67 9%, T1 cN0 M0 stage IA, Oncotype score 18, 11% ROR low risk      10/14/2011 - 11/17/2011 Radiation Therapy    Adjuvant Radiation therapy      10/14/2011 -  Anti-estrogen oral therapy    Arimidex 1 mg daily developed a change switched to Femara and exemestane and then took tamoxifen and now back to Arimidex from 01/09/2014 to complete August 2018       CHIEF COMPLIANT: Follow-up on anastrozole therapy  INTERVAL HISTORY: Katelyn Walsh is a 67 year old with above-mentioned history of left breast cancer treated with lumpectomy radiation is currently on antiestrogen therapy with anastrozole.  She had side effects to letrozole, exemestane and tamoxifen.  She developed itching of the skin however it we felt that it was not related to anastrozole and so she is able to resume her treatment.  She denies any lumps or nodules in the breast.  REVIEW OF SYSTEMS:   Constitutional: Denies fevers, chills or abnormal weight loss Eyes: Denies blurriness of vision Ears, nose, mouth, throat, and face: Denies mucositis or sore throat Respiratory: Denies cough, dyspnea or wheezes Cardiovascular: Denies palpitation, chest discomfort Gastrointestinal:  Denies nausea, heartburn or change in bowel habits Skin: Denies abnormal skin rashes Lymphatics: Denies new lymphadenopathy or easy bruising Neurological:Denies numbness, tingling  or new weaknesses Behavioral/Psych: Mood is stable, no new changes  Extremities: No lower extremity edema Breast:  denies any pain or lumps or nodules in either breasts All other systems were reviewed with the patient and are negative.  I have reviewed the past medical history, past surgical history, social history and family history with the patient and they are unchanged from previous note.  ALLERGIES:  is allergic to dust mite extract and pollen extract.  MEDICATIONS:  Current Outpatient Medications  Medication Sig Dispense Refill  . anastrozole (ARIMIDEX) 1 MG tablet Take 1 tablet (1 mg total) by mouth daily. 90 tablet 3  . aspirin EC 81 MG tablet Take 81 mg by mouth daily.    . cholecalciferol 5000 units TABS Take 1 tablet (5,000 Units total) by mouth daily.    . citalopram (CELEXA) 40 MG tablet   3  . hydrOXYzine (ATARAX/VISTARIL) 25 MG tablet Take 1 tablet (25 mg total) by mouth 3 (three) times daily as needed. 30 tablet 6  . levocetirizine (XYZAL) 5 MG tablet Take 1 tablet (5 mg total) by mouth every evening.    [name] Kitchen losartan (COZAAR) 100 MG tablet Take 100 mg by mouth every morning.     . montelukast (SINGULAIR) 10 MG tablet Take 10 mg by mouth at bedtime.    . Probiotic Product (ALIGN) 4 MG CAPS Take 1 capsule by mouth daily with breakfast.     . rOPINIRole (REQUIP) 1 MG tablet Take 1 mg by mouth 3 (three) times daily.    . simvastatin (ZOCOR) 40 MG tablet Take 40 mg by  mouth at bedtime.    . topiramate (TOPAMAX) 100 MG tablet Take 100 mg by mouth 2 (two) times daily.     No current facility-administered medications for this visit.     PHYSICAL EXAMINATION: ECOG PERFORMANCE STATUS: 1 - Symptomatic but completely ambulatory  Vitals:   06/08/17 1122  BP: (!) 145/78  Pulse: 75  Resp: 16  Temp: 98.2 F (36.8 C)  SpO2: 96%   Filed Weights   06/08/17 1122  Weight: 230 lb 8 oz (104.6 kg)    GENERAL:alert, no distress and comfortable SKIN: skin color, texture, turgor are  normal, no rashes or significant lesions EYES: normal, Conjunctiva are pink and non-injected, sclera clear OROPHARYNX:no exudate, no erythema and lips, buccal mucosa, and tongue normal  NECK: supple, thyroid normal size, non-tender, without nodularity LYMPH:  no palpable lymphadenopathy in the cervical, axillary or inguinal LUNGS: clear to auscultation and percussion with normal breathing effort HEART: regular rate & rhythm and no murmurs and no lower extremity edema ABDOMEN:abdomen soft, non-tender and normal bowel sounds MUSCULOSKELETAL:no cyanosis of digits and no clubbing  NEURO: alert & oriented x 3 with fluent speech, no focal motor/sensory deficits EXTREMITIES: No lower extremity edema BREAST: No palpable masses or nodules in either right or left breasts. No palpable axillary supraclavicular or infraclavicular adenopathy no breast tenderness or nipple discharge. (exam performed in the presence of a chaperone)  LABORATORY DATA:  I have reviewed the data as listed CMP Latest Ref Rng & Units 07/31/2014 01/09/2014 12/21/2012  Glucose 70 - 140 mg/dl 124 128 140  BUN 7.0 - 26.0 mg/dL 12.4 13.4 11.5  Creatinine 0.6 - 1.1 mg/dL 0.9 1.1 1.0  Sodium 136 - 145 mEq/L 142 142 139  Potassium 3.5 - 5.1 mEq/L 4.1 4.0 3.9  Chloride 96 - 112 mEq/L - - -  CO2 22 - 29 mEq/L 23 20(L) 20(L)  Calcium 8.4 - 10.4 mg/dL 8.9 9.0 9.4  Total Protein 6.4 - 8.3 g/dL 6.8 6.9 7.7  Total Bilirubin 0.20 - 1.20 mg/dL 0.66 0.72 1.30(H)  Alkaline Phos 40 - 150 U/L 74 78 79  AST 5 - 34 U/L _0 ALT 0 - 55 U/L _1 Lab Results  Component Value Date   WBC 5.0 07/31/2014   HGB 12.7 07/31/2014   HCT 38.9 07/31/2014   MCV 92.6 07/31/2014   PLT 193 07/31/2014   NEUTROABS 3.1 07/31/2014    ASSESSMENT & PLAN:  Primary cancer of upper inner quadrant of left female breast Left breast invasive ductal carcinoma, 1.8 cm, T1 C. N0 M0 stage IA ER percent, PR negative, HER-2 negative, grade 2, Ki-67 90%, 3 SLN  negative, Oncotype DX recurrence score 18, percent risk of recurrence, status post radiation therapy, Arimidex 1 mg daily was started August 2013 later switched to exemestane then switched to Femara and then switch to tamoxifen and again switched back to Anastrozole Nov 2015.  Anastrozole toxicities: 1. Itching of the skin: in spite of discontinuing anastrozole to 3 months the skin itching has not improved. So she resumed anti-estrogen therapy.  2. Bone density December 2015 revealed a T score of -0.9, normal bone density, this will need to be repeated 3. Fatigue and weight gain: Due to lack of exercise.  Stressed importance of physical exercise and eating healthy diet  Dizziness and neck pain: Much improved neck pain. Weight gain issues: I encouraged her to join weight loss programs.  I provided her information on the weight loss  program Patient loves going to [facility]. Frequent upper respiratory infections Renewed her prescription for anastrozole  Breast Cancer Surveillance: 1. Breast exam 06/08/2017: Normal 2. Mammogram   6/28/2018No evidence of malignancy breast density category B 12/15/2016: MRI pelvis: Perianal fistulous  Return to clinic in 1 year for follow-up.      No orders of the defined types were placed in this encounter.  The patient has a good understanding of the overall plan. she agrees with it. she will call with any problems that may develop before the next visit here.   Harriette Ohara, MD 06/08/17

## 2017-06-08 NOTE — Telephone Encounter (Signed)
Gave avs and calendar ° °

## 2017-06-10 ENCOUNTER — Ambulatory Visit: Payer: Medicare Other | Admitting: Hematology and Oncology

## 2017-06-11 ENCOUNTER — Ambulatory Visit: Payer: Medicare Other | Admitting: Hematology and Oncology

## 2017-06-22 DIAGNOSIS — J3081 Allergic rhinitis due to animal (cat) (dog) hair and dander: Secondary | ICD-10-CM | POA: Diagnosis not present

## 2017-06-22 DIAGNOSIS — J019 Acute sinusitis, unspecified: Secondary | ICD-10-CM | POA: Diagnosis not present

## 2017-06-22 DIAGNOSIS — J301 Allergic rhinitis due to pollen: Secondary | ICD-10-CM | POA: Diagnosis not present

## 2017-06-22 DIAGNOSIS — J3089 Other allergic rhinitis: Secondary | ICD-10-CM | POA: Diagnosis not present

## 2017-07-27 DIAGNOSIS — H02132 Senile ectropion of right lower eyelid: Secondary | ICD-10-CM | POA: Diagnosis not present

## 2017-09-20 DIAGNOSIS — H0231 Blepharochalasis right upper eyelid: Secondary | ICD-10-CM | POA: Diagnosis not present

## 2017-09-20 DIAGNOSIS — H02413 Mechanical ptosis of bilateral eyelids: Secondary | ICD-10-CM | POA: Diagnosis not present

## 2017-09-20 DIAGNOSIS — H0235 Blepharochalasis left lower eyelid: Secondary | ICD-10-CM | POA: Diagnosis not present

## 2017-09-20 DIAGNOSIS — H0234 Blepharochalasis left upper eyelid: Secondary | ICD-10-CM | POA: Diagnosis not present

## 2017-09-20 DIAGNOSIS — H0232 Blepharochalasis right lower eyelid: Secondary | ICD-10-CM | POA: Diagnosis not present

## 2017-09-20 DIAGNOSIS — H02032 Senile entropion of right lower eyelid: Secondary | ICD-10-CM | POA: Diagnosis not present

## 2017-09-20 DIAGNOSIS — H53489 Generalized contraction of visual field, unspecified eye: Secondary | ICD-10-CM | POA: Diagnosis not present

## 2017-09-22 DIAGNOSIS — H53483 Generalized contraction of visual field, bilateral: Secondary | ICD-10-CM | POA: Diagnosis not present

## 2017-09-24 ENCOUNTER — Other Ambulatory Visit: Payer: Self-pay | Admitting: Hematology and Oncology

## 2017-09-24 DIAGNOSIS — Z1231 Encounter for screening mammogram for malignant neoplasm of breast: Secondary | ICD-10-CM

## 2017-10-21 ENCOUNTER — Ambulatory Visit
Admission: RE | Admit: 2017-10-21 | Discharge: 2017-10-21 | Disposition: A | Discharge status: Home / Self Care | Payer: Medicare Other | Source: Ambulatory Visit | Attending: Hematology and Oncology | Admitting: Hematology and Oncology

## 2017-10-21 DIAGNOSIS — Z1231 Encounter for screening mammogram for malignant neoplasm of breast: Secondary | ICD-10-CM

## 2017-11-15 DIAGNOSIS — J3081 Allergic rhinitis due to animal (cat) (dog) hair and dander: Secondary | ICD-10-CM | POA: Diagnosis not present

## 2017-11-15 DIAGNOSIS — H0235 Blepharochalasis left lower eyelid: Secondary | ICD-10-CM | POA: Diagnosis not present

## 2017-11-15 DIAGNOSIS — H02052 Trichiasis without entropian right lower eyelid: Secondary | ICD-10-CM | POA: Diagnosis not present

## 2017-11-15 DIAGNOSIS — H53483 Generalized contraction of visual field, bilateral: Secondary | ICD-10-CM | POA: Diagnosis not present

## 2017-11-15 DIAGNOSIS — H0231 Blepharochalasis right upper eyelid: Secondary | ICD-10-CM | POA: Diagnosis not present

## 2017-11-15 DIAGNOSIS — H0234 Blepharochalasis left upper eyelid: Secondary | ICD-10-CM | POA: Diagnosis not present

## 2017-11-15 DIAGNOSIS — H02532 Eyelid retraction right lower eyelid: Secondary | ICD-10-CM | POA: Diagnosis not present

## 2017-11-15 DIAGNOSIS — H02032 Senile entropion of right lower eyelid: Secondary | ICD-10-CM | POA: Diagnosis not present

## 2017-11-15 DIAGNOSIS — J301 Allergic rhinitis due to pollen: Secondary | ICD-10-CM | POA: Diagnosis not present

## 2017-11-15 DIAGNOSIS — J3089 Other allergic rhinitis: Secondary | ICD-10-CM | POA: Diagnosis not present

## 2017-11-15 DIAGNOSIS — J4531 Mild persistent asthma with (acute) exacerbation: Secondary | ICD-10-CM | POA: Diagnosis not present

## 2017-11-15 DIAGNOSIS — H0232 Blepharochalasis right lower eyelid: Secondary | ICD-10-CM | POA: Diagnosis not present

## 2017-11-15 DIAGNOSIS — H02413 Mechanical ptosis of bilateral eyelids: Secondary | ICD-10-CM | POA: Diagnosis not present

## 2017-11-18 DIAGNOSIS — R7309 Other abnormal glucose: Secondary | ICD-10-CM | POA: Diagnosis not present

## 2017-11-18 DIAGNOSIS — H02003 Unspecified entropion of right eye, unspecified eyelid: Secondary | ICD-10-CM | POA: Diagnosis not present

## 2017-11-18 DIAGNOSIS — Z01818 Encounter for other preprocedural examination: Secondary | ICD-10-CM | POA: Diagnosis not present

## 2017-11-18 DIAGNOSIS — I10 Essential (primary) hypertension: Secondary | ICD-10-CM | POA: Diagnosis not present

## 2017-11-18 DIAGNOSIS — Z23 Encounter for immunization: Secondary | ICD-10-CM | POA: Diagnosis not present

## 2017-11-18 DIAGNOSIS — J45909 Unspecified asthma, uncomplicated: Secondary | ICD-10-CM | POA: Diagnosis not present

## 2017-11-18 DIAGNOSIS — L500 Allergic urticaria: Secondary | ICD-10-CM | POA: Diagnosis not present

## 2017-11-18 DIAGNOSIS — Z6838 Body mass index (BMI) 38.0-38.9, adult: Secondary | ICD-10-CM | POA: Diagnosis not present

## 2017-11-18 DIAGNOSIS — R110 Nausea: Secondary | ICD-10-CM | POA: Diagnosis not present

## 2017-11-18 DIAGNOSIS — E7849 Other hyperlipidemia: Secondary | ICD-10-CM | POA: Diagnosis not present

## 2017-11-18 DIAGNOSIS — G2581 Restless legs syndrome: Secondary | ICD-10-CM | POA: Diagnosis not present

## 2017-11-18 DIAGNOSIS — J3089 Other allergic rhinitis: Secondary | ICD-10-CM | POA: Diagnosis not present

## 2017-11-19 DIAGNOSIS — R7309 Other abnormal glucose: Secondary | ICD-10-CM | POA: Diagnosis not present

## 2017-11-24 DIAGNOSIS — H02532 Eyelid retraction right lower eyelid: Secondary | ICD-10-CM | POA: Diagnosis not present

## 2017-11-24 DIAGNOSIS — H53483 Generalized contraction of visual field, bilateral: Secondary | ICD-10-CM | POA: Diagnosis not present

## 2017-11-24 DIAGNOSIS — H02834 Dermatochalasis of left upper eyelid: Secondary | ICD-10-CM | POA: Diagnosis not present

## 2017-11-24 DIAGNOSIS — H02002 Unspecified entropion of right lower eyelid: Secondary | ICD-10-CM | POA: Diagnosis not present

## 2017-11-24 DIAGNOSIS — H02032 Senile entropion of right lower eyelid: Secondary | ICD-10-CM | POA: Diagnosis not present

## 2017-11-24 DIAGNOSIS — H02052 Trichiasis without entropian right lower eyelid: Secondary | ICD-10-CM | POA: Diagnosis not present

## 2017-11-24 DIAGNOSIS — H57813 Brow ptosis, bilateral: Secondary | ICD-10-CM | POA: Diagnosis not present

## 2017-11-24 DIAGNOSIS — H02413 Mechanical ptosis of bilateral eyelids: Secondary | ICD-10-CM | POA: Diagnosis not present

## 2017-11-24 DIAGNOSIS — H02831 Dermatochalasis of right upper eyelid: Secondary | ICD-10-CM | POA: Diagnosis not present

## 2017-11-30 ENCOUNTER — Ambulatory Visit (INDEPENDENT_AMBULATORY_CARE_PROVIDER_SITE_OTHER): Payer: Medicare Other | Admitting: Neurology

## 2017-11-30 ENCOUNTER — Encounter: Payer: Self-pay | Admitting: Neurology

## 2017-11-30 VITALS — BP 140/81 | HR 95 | Ht 65.25 in | Wt 232.0 lb

## 2017-11-30 DIAGNOSIS — G4761 Periodic limb movement disorder: Secondary | ICD-10-CM

## 2017-11-30 DIAGNOSIS — R0683 Snoring: Secondary | ICD-10-CM

## 2017-11-30 DIAGNOSIS — R51 Headache: Secondary | ICD-10-CM | POA: Diagnosis not present

## 2017-11-30 DIAGNOSIS — G4719 Other hypersomnia: Secondary | ICD-10-CM

## 2017-11-30 DIAGNOSIS — J4530 Mild persistent asthma, uncomplicated: Secondary | ICD-10-CM

## 2017-11-30 DIAGNOSIS — E669 Obesity, unspecified: Secondary | ICD-10-CM

## 2017-11-30 DIAGNOSIS — G4733 Obstructive sleep apnea (adult) (pediatric): Secondary | ICD-10-CM | POA: Diagnosis not present

## 2017-11-30 DIAGNOSIS — G2581 Restless legs syndrome: Secondary | ICD-10-CM | POA: Diagnosis not present

## 2017-11-30 DIAGNOSIS — R519 Headache, unspecified: Secondary | ICD-10-CM

## 2017-11-30 DIAGNOSIS — R351 Nocturia: Secondary | ICD-10-CM

## 2017-11-30 NOTE — Progress Notes (Signed)
Subjective:    Patient ID: Katelyn Walsh is a 67 y.o. female.  HPI     [name], MD, PhD [facility] [address] [place], [place] [address]  Dear Dr. Ardeth Perfect,   I saw your patient, Katelyn Walsh, upon your kind request in my sleep clinic today for initial consultation of her sleep disorder, in particular, reevaluation of her prior diagnosis of OSA. The patient is accompanied by her sister-in-law today. As you know, Katelyn Walsh is a 67 year old right-handed woman with an underlying medical history of breast cancer status post lumpectomy and XRT, allergic rhinitis, anxiety, depression, hypertension, hyperlipidemia, restless leg syndrome, history of migraines, vitamin D deficiency, s/p recent eye surgery, and obesity, who reports snoring and excessive daytime somnolence. I reviewed your office note from 11/18/2017, when she saw Janus Molder, nurse practitioner. She reports having had a sleep study about 10 years ago and was diagnosed with mild obstructive sleep apnea. She tried CPAP but could not tolerate it at the time. Her Epworth sleepiness score is 10 out of 24 today, fatigue score is 56 out of 63. She is widowed and lives alone, she is retired, no children. She is a nonsmoker and drinks alcohol rarely, caffeine in the form of coffee and tea as well as diet soda, about 2 cups of coffee and 2-3 bottles of diet soda per day on average. She is trying to cut back. She is trying to increase her water intake. She used to work for [employer] 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 Patient goes to bed between 11 and midnight on most nights, rise time is between 9:30 and 10. She has nocturia about once per average night and has had morning headaches. She does have a history of migraines. She has been on Topamax generic. Her  restless legs is under control on Requip. She has a history of PLMS and was told that in the sleep study she had a lot of leg movements. She would be willing to try CPAP again. She had eye surgery about a week ago and has a follow-up this week.  Her Past Medical History Is Significant For: Past Medical History:  Diagnosis Date  . Asthma    allergies  . Breast cancer ([place])    07-30-2011-- stage1 dcis  aT1c,pN0, pMX----  s/p left breast partial mastectomy w/ sln bx  and radiation therapy ,  complete 10-28-2011  . Depression   . Hot flashes   . Hyperlipidemia   . Hypertension   . Migraines   . Open breast wound    non-healing left breast surgical wound --- no draining---  dressing change at wound care center  &  hyperbaric oxyogen therapy  . OSA (obstructive sleep apnea)    NOT USED CPAP SINCE 2012  (PT STATES SLEEP STUDY WAS MILD OSA)  . S/P radiation therapy 09/15/11 - 10/28/11   Left Breast  . Seasonal allergies     Her Past Surgical History Is Significant For: Past Surgical History:  Procedure Laterality Date  . BREAST BIOPSY Left 07/07/2012   Procedure: Left Breast Debridement;  Surgeon: Marcello Moores A. Cornett, MD;  Location: [facility];  Service: General;  Laterality: Left;  Left Breast Debridement  . BREAST LUMPECTOMY    . COLONOSCOPY     polyps removed in  past  . LUMBAR MICRODISCECTOMY  2005   L4 -- L5  . MASTECTOMY PARTIAL / LUMPECTOMY W/ AXILLARY LYMPHADENECTOMY  07-30-2011   Left Breast : Invasive Ductal Carcinoma: 0/3  Nodes Negative: ER 100%, PR 0%, Her 2Neu Negative  . TONSILLECTOMY  AS CHILD    Her Family History Is Significant For: Family History  Problem Relation Age of Onset  . Cancer Father        brain  . Stroke Father   . Hypertension Mother     Her Social History Is Significant For: Social History   Socioeconomic History  . Marital status: Widowed    Spouse name: Not on file  . Number of children: Not on file  . Years of education: Not on file  . Highest  education level: Not on file  Occupational History  . Not on file  Social Needs  . Financial resource strain: Not on file  . Food insecurity:    Worry: Not on file    Inability: Not on file  . Transportation needs:    Medical: Not on file    Non-medical: Not on file  Tobacco Use  . Smoking status: Never Smoker  . Smokeless tobacco: Never Used  Substance and Sexual Activity  . Alcohol use: Yes    Comment: occasional  wine  . Drug use: No  . Sexual activity: Not on file  Lifestyle  . Physical activity:    Days per week: Not on file    Minutes per session: Not on file  . Stress: Not on file  Relationships  . Social connections:    Talks on phone: Not on file    Gets together: Not on file    Attends religious service: Not on file    Active member of club or organization: Not on file    Attends meetings of clubs or organizations: Not on file    Relationship status: Not on file  Other Topics Concern  . Not on file  Social History Narrative  . Not on file    Her Allergies Are:  Allergies  Allergen Reactions  . Dust Mite Extract   . Pollen Extract   :   Her Current Medications Are:  Outpatient Encounter Medications as of 11/30/2017  Medication Sig  . albuterol (PROVENTIL HFA;VENTOLIN HFA) 108 (90 Base) MCG/ACT inhaler Inhale 2 puffs into the lungs every 6 (six) hours as needed for wheezing or shortness of breath.  . anastrozole (ARIMIDEX) 1 MG tablet Take 1 tablet (1 mg total) by mouth daily.  [name] Kitchen aspirin EC 81 MG tablet Take 81 mg by mouth daily.  [name] Kitchen azelastine (ASTELIN) 0.1 % nasal spray Place 1 spray into both nostrils daily. Use in each nostril as directed  . benzonatate (TESSALON) 100 MG capsule Take 100-200 mg by mouth 3 (three) times daily as needed for cough.  . budesonide-formoterol (SYMBICORT) 80-4.5 MCG/ACT inhaler Inhale 2 puffs into the lungs 2 (two) times daily.  . cholecalciferol 5000 units TABS Take 1 tablet (5,000 Units total) by mouth daily.  . citalopram  (CELEXA) 40 MG tablet Take 40 mg by mouth daily.   . furosemide (LASIX) 20 MG tablet Take 20 mg by mouth daily as needed.  . hydrOXYzine (ATARAX/VISTARIL) 25 MG tablet Take 1 tablet (25 mg total) by mouth 3 (three) times daily as needed.  [name] Kitchen levocetirizine (XYZAL) 5 MG tablet Take 1 tablet (5 mg total) by mouth every evening.  [name] Kitchen losartan (COZAAR) 100 MG tablet Take 100 mg  by mouth every morning.   . montelukast (SINGULAIR) 10 MG tablet Take 10 mg by mouth at bedtime.  . Probiotic Product (ALIGN) 4 MG CAPS Take 1 capsule by mouth daily with breakfast.   . rOPINIRole (REQUIP) 1 MG tablet Take 1-2 mg by mouth at bedtime.   . simvastatin (ZOCOR) 40 MG tablet Take 40 mg by mouth at bedtime.  . topiramate (TOPAMAX) 100 MG tablet Take 100 mg by mouth 2 (two) times daily.   No facility-administered encounter medications on file as of 11/30/2017.   :  Review of Systems:  Out of a complete 14 point review of systems, all are reviewed and negative with the exception of these symptoms as listed below:  Review of Systems  Neurological:       Pt presents today to discuss her sleep. Pt has had a sleep study about 10 years ago and was prescribed a cpap machine for mild osa but could not tolerate it.  Epworth Sleepiness Scale 0= would never doze 1= slight chance of dozing 2= moderate chance of dozing 3= high chance of dozing  Sitting and reading: 1 Watching TV: 2 Sitting inactive in a public place (ex. Theater or meeting): 1 As a passenger in a car for an hour without a break: 2 Lying down to rest in the afternoon: 3 Sitting and talking to someone: 0 Sitting quietly after lunch (no alcohol): 1 In a car, while stopped in traffic: 0 Total: 10     Objective:  Neurological Exam  Physical Exam Physical Examination:   Vitals:   11/30/17 0853  BP: 140/81  Pulse: 95   General Examination: The patient is a very pleasant 67 y.o. female in no acute distress. She appears well-developed and  well-nourished and well groomed.   HEENT: Normocephalic, atraumatic, pupils are equal, round and reactive to light and accommodation. Recent eye surgery, s/p blepharoplasty. Extraocular tracking is good without limitation to gaze excursion or nystagmus noted. Normal smooth pursuit is noted. Hearing is grossly intact. Tympanic membranes are clear bilaterally. Face is symmetric with normal facial animation and normal facial sensation. Speech is clear with no dysarthria noted. There is no hypophonia. There is no lip, neck/head, jaw or voice tremor. Neck is supple with full range of passive and active motion. There are no carotid bruits on auscultation. Oropharynx exam reveals: mild mouth dryness, adequate dental hygiene and moderate airway crowding, due to wider uvula, smaller airway entry. Mallampati is class III. Tongue protrudes centrally and palate elevates symmetrically. Tonsils are absent. Neck size is 17 inches. She has a Mild overbite.    Chest: Clear to auscultation without wheezing, rhonchi or crackles noted.  Heart: S1+S2+0, regular and normal without murmurs, rubs or gallops noted.   Abdomen: Soft, non-tender and non-distended with normal bowel sounds appreciated on auscultation.  Extremities: There is no pitting edema in the distal lower extremities bilaterally. Pedal pulses are intact.  Skin: Warm and dry without trophic changes noted. There are no varicose veins.   Musculoskeletal: exam reveals no obvious joint deformities, tenderness or joint swelling or erythema.   Neurologically:  Mental status: The patient is awake, alert and oriented in all 4 spheres. Her immediate and remote memory, attention, language skills and fund of knowledge are appropriate. There is no evidence of aphasia, agnosia, apraxia or anomia. Speech is clear with normal prosody and enunciation. Thought process is linear. Mood is normal and affect is normal.  Cranial nerves II - XII are as described above under HEENT  exam. In addition: shoulder shrug is normal with equal shoulder height noted. Motor exam: Normal bulk, strength and tone is noted. There is no drift, tremor or rebound. Romberg is negative. Reflexes are 1+ throughout. Fine motor skills and coordination: intact with normal finger taps, normal hand movements, normal rapid alternating patting, normal foot taps and normal foot agility.  Cerebellar testing: No dysmetria or intention tremor. There is no truncal or gait ataxia.  Sensory exam: intact to light touch in the upper and lower extremities.  Gait, station and balance: She stands easily. No veering to one side is noted. No leaning to one side is noted. Posture is age-appropriate and stance is narrow based. Gait shows normal stride length and normal pace. No problems turning are noted. Tandem walk is somewhat challenging for her.   Assessment and Plan:  In summary, Katelyn Walsh is a very pleasant 67 y.o.-year old female with an underlying medical history of breast cancer status post lumpectomy and XRT, allergic rhinitis, anxiety, depression, hypertension, hyperlipidemia, restless leg syndrome, history of migraines, vitamin D deficiency, s/p recent eye surgery, and obesity, whose history and physical exam are in keeping with obstructive sleep apnea (OSA).  She also has a history of restless leg syndrome and PLMS. I had a long chat with the patient and her sister about my findings and the diagnosis of OSA, its prognosis and treatment options. We talked about medical treatments, surgical interventions and non-pharmacological approaches. I explained in particular the risks and ramifications of untreated moderate to severe OSA, especially with respect to developing cardiovascular disease down the Road, including congestive heart failure, difficult to treat hypertension, cardiac arrhythmias, or stroke. Even type 2 diabetes has, in part, been linked to untreated OSA. Symptoms of untreated OSA include daytime  sleepiness, memory problems, mood irritability and mood disorder such as depression and anxiety, lack of energy, as well as recurrent headaches, especially morning headaches. We talked about trying to maintain a healthy lifestyle in general, as well as the importance of weight control. I encouraged the patient to eat healthy, exercise daily and keep well hydrated, to keep a scheduled bedtime and wake time routine, to not skip any meals and eat healthy snacks in between meals. I advised the patient not to drive when feeling sleepy. I recommended the following at this time: sleep study with potential positive airway pressure titration. (We will score hypopneas at 4% and split the sleep study into diagnostic and treatment portion, if the estimated. 2 hour AHI is >20/h).   I explained the sleep test procedure to the patient and also outlined possible surgical and non-surgical treatment options of OSA, including the use of a custom-made dental device (which would require a referral to a specialist dentist or oral surgeon), upper airway surgical options, such as pillar implants, radiofrequency surgery, tongue base surgery, and UPPP (which would involve a referral to an ENT surgeon). Rarely, jaw surgery such as mandibular advancement may be considered.  I also explained the CPAP treatment option to the patient, who indicated that she would be willing to try CPAP if the need arises. I explained the importance of being compliant with PAP treatment, not only for insurance purposes but primarily to improve Her symptoms, and for the patient's long term health benefit, including to reduce Her cardiovascular risks. I answered all their questions today and the patient and her sister were in agreement. I plan to see her back after the sleep study is completed and encouraged her to call with any  interim questions, concerns, problems or updates.   Thank you very much for allowing me to participate in the care of this nice  patient. If I can be of any further assistance to you please do not hesitate to call me at [phone].  Sincerely,   [name], MD, PhD

## 2017-11-30 NOTE — Patient Instructions (Signed)

## 2017-12-07 DIAGNOSIS — J301 Allergic rhinitis due to pollen: Secondary | ICD-10-CM | POA: Diagnosis not present

## 2017-12-07 DIAGNOSIS — J3089 Other allergic rhinitis: Secondary | ICD-10-CM | POA: Diagnosis not present

## 2017-12-07 DIAGNOSIS — L299 Pruritus, unspecified: Secondary | ICD-10-CM | POA: Diagnosis not present

## 2017-12-07 DIAGNOSIS — J3081 Allergic rhinitis due to animal (cat) (dog) hair and dander: Secondary | ICD-10-CM | POA: Diagnosis not present

## 2017-12-08 DIAGNOSIS — R82998 Other abnormal findings in urine: Secondary | ICD-10-CM | POA: Diagnosis not present

## 2017-12-08 DIAGNOSIS — E559 Vitamin D deficiency, unspecified: Secondary | ICD-10-CM | POA: Diagnosis not present

## 2017-12-08 DIAGNOSIS — I10 Essential (primary) hypertension: Secondary | ICD-10-CM | POA: Diagnosis not present

## 2017-12-08 DIAGNOSIS — E7849 Other hyperlipidemia: Secondary | ICD-10-CM | POA: Diagnosis not present

## 2017-12-08 DIAGNOSIS — R7309 Other abnormal glucose: Secondary | ICD-10-CM | POA: Diagnosis not present

## 2017-12-13 DIAGNOSIS — G2581 Restless legs syndrome: Secondary | ICD-10-CM | POA: Diagnosis not present

## 2017-12-13 DIAGNOSIS — Z0000 Encounter for general adult medical examination without abnormal findings: Secondary | ICD-10-CM | POA: Diagnosis not present

## 2017-12-13 DIAGNOSIS — H6123 Impacted cerumen, bilateral: Secondary | ICD-10-CM | POA: Diagnosis not present

## 2017-12-13 DIAGNOSIS — Z6837 Body mass index (BMI) 37.0-37.9, adult: Secondary | ICD-10-CM | POA: Diagnosis not present

## 2017-12-13 DIAGNOSIS — E1169 Type 2 diabetes mellitus with other specified complication: Secondary | ICD-10-CM | POA: Diagnosis not present

## 2017-12-13 DIAGNOSIS — F330 Major depressive disorder, recurrent, mild: Secondary | ICD-10-CM | POA: Diagnosis not present

## 2017-12-13 DIAGNOSIS — G4761 Periodic limb movement disorder: Secondary | ICD-10-CM | POA: Diagnosis not present

## 2017-12-13 DIAGNOSIS — R946 Abnormal results of thyroid function studies: Secondary | ICD-10-CM | POA: Diagnosis not present

## 2017-12-13 DIAGNOSIS — Z1389 Encounter for screening for other disorder: Secondary | ICD-10-CM | POA: Diagnosis not present

## 2017-12-13 DIAGNOSIS — G4733 Obstructive sleep apnea (adult) (pediatric): Secondary | ICD-10-CM | POA: Diagnosis not present

## 2017-12-13 DIAGNOSIS — R0609 Other forms of dyspnea: Secondary | ICD-10-CM | POA: Diagnosis not present

## 2017-12-27 ENCOUNTER — Ambulatory Visit (INDEPENDENT_AMBULATORY_CARE_PROVIDER_SITE_OTHER): Payer: Medicare Other | Admitting: Neurology

## 2017-12-27 DIAGNOSIS — G2581 Restless legs syndrome: Secondary | ICD-10-CM

## 2017-12-27 DIAGNOSIS — R51 Headache: Secondary | ICD-10-CM

## 2017-12-27 DIAGNOSIS — G4719 Other hypersomnia: Secondary | ICD-10-CM

## 2017-12-27 DIAGNOSIS — E669 Obesity, unspecified: Secondary | ICD-10-CM

## 2017-12-27 DIAGNOSIS — G4734 Idiopathic sleep related nonobstructive alveolar hypoventilation: Secondary | ICD-10-CM

## 2017-12-27 DIAGNOSIS — R351 Nocturia: Secondary | ICD-10-CM

## 2017-12-27 DIAGNOSIS — G4733 Obstructive sleep apnea (adult) (pediatric): Secondary | ICD-10-CM | POA: Diagnosis not present

## 2017-12-27 DIAGNOSIS — G4730 Sleep apnea, unspecified: Secondary | ICD-10-CM

## 2017-12-27 DIAGNOSIS — R0683 Snoring: Secondary | ICD-10-CM

## 2017-12-27 DIAGNOSIS — G4720 Circadian rhythm sleep disorder, unspecified type: Secondary | ICD-10-CM

## 2017-12-27 DIAGNOSIS — G4761 Periodic limb movement disorder: Secondary | ICD-10-CM

## 2017-12-27 DIAGNOSIS — R519 Headache, unspecified: Secondary | ICD-10-CM

## 2017-12-27 DIAGNOSIS — J4530 Mild persistent asthma, uncomplicated: Secondary | ICD-10-CM

## 2018-01-04 ENCOUNTER — Telehealth: Payer: Self-pay

## 2018-01-04 DIAGNOSIS — G4734 Idiopathic sleep related nonobstructive alveolar hypoventilation: Secondary | ICD-10-CM

## 2018-01-04 NOTE — Telephone Encounter (Signed)
I called pt and discussed her sleep study results with her. She does not have a pulmonologist and asks that Dr. Rexene Alberts refer her to pulmonology. Pt will pursue weight loss and will avoid supine sleep. Pt asked that I mail her a copy of her sleep study results and I confirmed with her that the address we have on file is correct. A copy of her sleep study will also be sent to Dr. Ardeth Perfect. Pt verbalized understanding of results. Pt had no questions at this time but was encouraged to call back if questions arise.

## 2018-01-04 NOTE — Procedures (Signed)
PATIENT'S NAME:  Katelyn Walsh, Katelyn Walsh DOB:      April 12, 2050      MR#:    [id number]     DATE OF RECORDING: 12/27/2017 REFERRING M.D.:  Velna Hatchet, MD Study Performed:   Baseline Polysomnogram HISTORY: 67 year old woman with a history of breast cancer (s/p lumpectomy and XRT), allergic rhinitis, asthma, anxiety, depression, hypertension, hyperlipidemia, restless leg syndrome, history of migraines, vitamin D deficiency, s/p recent eye surgery, and obesity, who reports snoring and excessive daytime somnolence. She was diagnosed with mild obstructive sleep apnea many years ago, tried CPAP, but could not tolerate it at the time. Her Epworth sleepiness score is 10 out of 24, BMI of 38.6 kg/m2. The patient's neck circumference measured 17 inches.  CURRENT MEDICATIONS: Albuterol, Anastrozole, Aspirin, Azelastine, Benzonatate, Budsonide, Cholecalciferol, Citalopram, Furosemide, Hydroxyzine, Levocetirizine, Losartan, Montelukast, Probiotic Product, Ropinirole, Simvastatin and Topiramate.   PROCEDURE:  This is a multichannel digital polysomnogram utilizing the Somnostar 11.2 system.  Electrodes and sensors were applied and monitored per AASM Specifications.   EEG, EOG, Chin and Limb EMG, were sampled at 200 Hz.  ECG, Snore and Nasal Pressure, Thermal Airflow, Respiratory Effort, CPAP Flow and Pressure, Oximetry was sampled at 50 Hz. Digital video and audio were recorded.      BASELINE STUDY  Lights Out was at 22:06 and Lights On at 05:26.  Total recording time (TRT) was 440.5 minutes, with a total sleep time (TST) of 427 minutes.   The patient's sleep latency was 1.5 minutes.  REM latency was 319.5 minutes, which is markedly delayed. The sleep efficiency was 96.9 %.     SLEEP ARCHITECTURE: WASO (Wake after sleep onset) was 10.5 minutes.  There were 16.5 minutes in Stage N1, 349 minutes Stage N2, 44 minutes Stage N3 and 17.5 minutes in Stage REM.  The percentage of Stage N1 was 3.9%, Stage N2 was 81.7%, which is  markedly increased, Stage N3 was 10.3% and Stage R (REM sleep) was 4.1%, which is markedly reduced. The arousals were noted as: 51 were spontaneous, 6 were associated with PLMs, 33 were associated with respiratory events.   RESPIRATORY ANALYSIS:  There were a total of 33 respiratory events:  0 obstructive apneas, 0 central apneas and 0 mixed apneas with a total of 0 apneas and an apnea index (AI) of 0 /hour. There were 33 hypopneas with a hypopnea index of 4.6 /hour. The patient also had 0 respiratory event related arousals (RERAs).      The total APNEA/HYPOPNEA INDEX (AHI) was 4.6 /hour and the total RESPIRATORY DISTURBANCE INDEX was 4.6 /hour.  3 events occurred in REM sleep and 60 events in NREM. The REM AHI was 10.3 /hour, versus a non-REM AHI of 4.4. The patient spent 427 minutes of total sleep time in the supine position and 0 minutes in non-supine. The supine AHI was 4.6 versus a non-supine AHI of n/a.  OXYGEN SATURATION & C02:  The Wake baseline 02 saturation was 92%, with the lowest being 84%. Time spent below 89% saturation equaled 371 minutes. The patient was noted to have lower oxygen saturations during sleep throughout the study, in the absence of obstructive or central respiratory events. Her average oxygen saturation was around 87%. At 04:43 (epoch 801) supplemental oxygen was added at 2 lpm, after which her oxygen saturations averaged 93%.    PERIODIC LIMB MOVEMENTS: The patient had a total of 17 Periodic Limb Movements.  The Periodic Limb Movement (PLM) index was 2.4 and the PLM Arousal index was .8/hour.  Audio and video analysis did not show any abnormal or unusual movements, behaviors, phonations or vocalizations. The patient took no bathroom breaks. Mild to moderate snoring was noted. The EKG was in keeping with normal sinus rhythm (NSR). Post-study, the patient indicated that sleep was worse than usual.   IMPRESSION:  1. Nocturnal hypoxemia 2. Mild REM related  OSA 3. Dysfunctions associated with sleep stages or arousals from sleep   RECOMMENDATIONS:  1. This study does not demonstrate any significant obstructive or central sleep disordered breathing with the exception of snoring and mild OSA during REM sleep. Her overall AHI was below 5/hour. Treatment with positive airway pressure is not warranted; weight loss and avoiding the supine sleep position will likely suffice as treatment. The patient was noted to have lower oxygen saturations throughout the night, improved with supplemental oxygen. An underlying cardiac or pulmonary cause cannot be ruled out; the patient will be advised to seek consultation with pulmonology.  2. This study shows no significant sleep fragmentation, but shows abnormal sleep stage percentages; these are nonspecific findings and per se do not signify an intrinsic sleep disorder or a cause for the patient's sleep-related symptoms. Causes include (but are not limited to) the first night effect of the sleep study, circadian rhythm disturbances, medication effect or an underlying mood disorder or medical problem.  3. The patient should be cautioned not to drive, work at heights, or operate dangerous or heavy equipment when tired or sleepy. Review and reiteration of good sleep hygiene measures should be pursued with any patient. 4. The patient will be advised to follow up with the referring provider, who will be notified of the test esults. She will be offered a follow up in sleep clinic if desired.   I certify that I have reviewed the entire raw data recording prior to the issuance of this report in accordance with the Standards of Accreditation of the American Academy of Sleep Medicine (AASM)   [name], MD, PhD Diplomat, American Board of Neurology and Sleep Medicine (Neurology and Sleep Medicine)

## 2018-01-04 NOTE — Telephone Encounter (Signed)
-----   Message from [name], MD sent at 01/04/2018  8:00 AM EST ----- Patient referred by Dr. Ardeth Perfect, seen by me on 11/30/17, diagnostic PSG on 12/27/17.   Please call and notify the patient that the recent sleep study did not demonstrate any significant obstructive or central sleep disordered breathing with the exception of snoring and mild OSA during REM sleep. Her overall AHI was below 5/hour. Treatment with positive airway pressure is not warranted; weight loss and avoiding the supine sleep position will likely suffice as treatment. The patient was noted to have lower oxygen saturations throughout the night, improved with supplemental oxygen. An underlying cardiac or pulmonary cause cannot be ruled out; I would favor, the patient seek consultation with pulmonology. She can d/w PCP or I can make a referral if she desires. I can see her in FU if needed. She can FU with PCP otherwise.   Thanks,  [name], MD, PhD [facility])

## 2018-01-04 NOTE — Progress Notes (Signed)
Patient referred by Dr. Ardeth Perfect, seen by me on 11/30/17, diagnostic PSG on 12/27/17.   Please call and notify the patient that the recent sleep study did not demonstrate any significant obstructive or central sleep disordered breathing with the exception of snoring and mild OSA during REM sleep. Her overall AHI was below 5/hour. Treatment with positive airway pressure is not warranted; weight loss and avoiding the supine sleep position will likely suffice as treatment. The patient was noted to have lower oxygen saturations throughout the night, improved with supplemental oxygen. An underlying cardiac or pulmonary cause cannot be ruled out; I would favor, the patient seek consultation with pulmonology. She can d/w PCP or I can make a referral if she desires. I can see her in FU if needed. She can FU with PCP otherwise.   Thanks,  [name], MD, PhD [facility])

## 2018-01-17 ENCOUNTER — Telehealth: Payer: Self-pay | Admitting: Neurology

## 2018-01-17 NOTE — Telephone Encounter (Signed)
Called [facility] and they have received referral . They had not scheduled patient yet . Patient will give it to Her [place] Patient has there telephone number [phone] .

## 2018-01-17 NOTE — Telephone Encounter (Signed)
Pt is asking for a call from H[place] Dyer because she has not heard from the Pulmonology office

## 2018-02-04 ENCOUNTER — Ambulatory Visit (INDEPENDENT_AMBULATORY_CARE_PROVIDER_SITE_OTHER): Payer: Medicare Other | Admitting: Pulmonary Disease

## 2018-02-04 ENCOUNTER — Encounter: Payer: Self-pay | Admitting: Pulmonary Disease

## 2018-02-04 VITALS — BP 138/64 | HR 101 | Ht 65.0 in | Wt 233.4 lb

## 2018-02-04 DIAGNOSIS — G478 Other sleep disorders: Secondary | ICD-10-CM | POA: Diagnosis not present

## 2018-02-04 DIAGNOSIS — R0602 Shortness of breath: Secondary | ICD-10-CM | POA: Diagnosis not present

## 2018-02-04 DIAGNOSIS — G4733 Obstructive sleep apnea (adult) (pediatric): Secondary | ICD-10-CM

## 2018-02-04 MED ORDER — ALBUTEROL SULFATE HFA 108 (90 BASE) MCG/ACT IN AERS: 2.0000 | INHALATION_SPRAY | Freq: Four times a day (QID) | RESPIRATORY_TRACT | 2 refills | Status: DC | PRN

## 2018-02-04 NOTE — Patient Instructions (Signed)
History of severe snoring A mild positive study about 10 years ago Most recent study was negative Significant daytime symptoms  Probability of sleep disordered breathing is moderate Repeating your sleep study is appropriate  Importance of exercise and weight loss as discussed  I will see you back in the office in about 6 to 8 weeks   Sleep Apnea Sleep apnea is a condition in which breathing pauses or becomes shallow during sleep. Episodes of sleep apnea usually last 10 seconds or longer, and they may occur as many as 20 times an hour. Sleep apnea disrupts your sleep and keeps your body from getting the rest that it needs. This condition can increase your risk of certain health problems, including:  Heart attack.  Stroke.  Obesity.  Diabetes.  Heart failure.  Irregular heartbeat.  There are three kinds of sleep apnea:  Obstructive sleep apnea. This kind is caused by a blocked or collapsed airway.  Central sleep apnea. This kind happens when the part of the brain that controls breathing does not send the correct signals to the muscles that control breathing.  Mixed sleep apnea. This is a combination of obstructive and central sleep apnea.  What are the causes? The most common cause of this condition is a collapsed or blocked airway. An airway can collapse or become blocked if:  Your throat muscles are abnormally relaxed.  Your tongue and tonsils are larger than normal.  You are overweight.  Your airway is smaller than normal.  What increases the risk? This condition is more likely to develop in people who:  Are overweight.  Smoke.  Have a smaller than normal airway.  Are elderly.  Are female.  Drink alcohol.  Take sedatives or tranquilizers.  Have a family history of sleep apnea.  What are the signs or symptoms? Symptoms of this condition include:  Trouble staying asleep.  Daytime sleepiness and tiredness.  Irritability.  Loud snoring.  Morning  headaches.  Trouble concentrating.  Forgetfulness.  Decreased interest in sex.  Unexplained sleepiness.  Mood swings.  Personality changes.  Feelings of depression.  Waking up often during the night to urinate.  Dry mouth.  Sore throat.  How is this diagnosed? This condition may be diagnosed with:  A medical history.  A physical exam.  A series of tests that are done while you are sleeping (sleep study). These tests are usually done in a sleep lab, but they may also be done at home.  How is this treated? Treatment for this condition aims to restore normal breathing and to ease symptoms during sleep. It may involve managing health issues that can affect breathing, such as high blood pressure or obesity. Treatment may include:  Sleeping on your side.  Using a decongestant if you have nasal congestion.  Avoiding the use of depressants, including alcohol, sedatives, and narcotics.  Losing weight if you are overweight.  Making changes to your diet.  Quitting smoking.  Using a device to open your airway while you sleep, such as: ? An oral appliance. This is a custom-made mouthpiece that shifts your lower jaw forward. ? A continuous positive airway pressure (CPAP) device. This device delivers oxygen to your airway through a mask. ? A nasal expiratory positive airway pressure (EPAP) device. This device has valves that you put into each nostril. ? A bi-level positive airway pressure (BPAP) device. This device delivers oxygen to your airway through a mask.  Surgery if other treatments do not work. During surgery, excess tissue is  removed to create a wider airway.  It is important to get treatment for sleep apnea. Without treatment, this condition can lead to:  High blood pressure.  Coronary artery disease.  (Men) An inability to achieve or maintain an erection (impotence).  Reduced thinking abilities.  Follow these instructions at home:  Make any lifestyle  changes that your health care provider recommends.  Eat a healthy, well-balanced diet.  Take over-the-counter and prescription medicines only as told by your health care provider.  Avoid using depressants, including alcohol, sedatives, and narcotics.  Take steps to lose weight if you are overweight.  If you were given a device to open your airway while you sleep, use it only as told by your health care provider.  Do not use any tobacco products, such as cigarettes, chewing tobacco, and e-cigarettes. If you need help quitting, ask your health care provider.  Keep all follow-up visits as told by your health care provider. This is important. Contact a health care provider if:  The device that you received to open your airway during sleep is uncomfortable or does not seem to be working.  Your symptoms do not improve.  Your symptoms get worse. Get help right away if:  You develop chest pain.  You develop shortness of breath.  You develop discomfort in your back, arms, or stomach.  You have trouble speaking.  You have weakness on one side of your body.  You have drooping in your face. These symptoms may represent a serious problem that is an emergency. Do not wait to see if the symptoms will go away. Get medical help right away. Call your local emergency services (911 in the [place] Do not drive yourself to the hospital. This information is not intended to replace advice given to you by your health care provider. Make sure you discuss any questions you have with your health care provider. Document Released: 01/30/2002 Document Revised: 10/06/2015 Document Reviewed: 11/19/2014 Elsevier Interactive Patient Education  [employer].

## 2018-02-04 NOTE — Progress Notes (Signed)
Katelyn Walsh    [id number]    [birth year]  Primary Care Physician:Velna Hatchet, MD  Referring Physician: Velna Hatchet, [address] [place], [place] [address]  Chief complaint:   Patient with a history of severe snoring, nonrestorative sleep  HPI:  She recently had a study that was negative for significant sleep disordered breathing with an AHI of 4.6 Reports wheezing, severe allergies  She has a history of long-term severe snoring She had a sleep study about 10 years ago that showed mild obstructive sleep apnea Usually goes to bed between 11 and 2 AM Sometimes takes hours to fall asleep Wakes up between 3 and 5 times during the night sometimes Final awakening time is variable  Sleep is nonrestorative  Dryness of the mouth Uses inhalers-Symbicort, albuterol  Never smoker  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 albuterol (PROVENTIL HFA;VENTOLIN HFA) 108 (90 Base) MCG/ACT inhaler Inhale 2 puffs into the lungs every 6 (six) hours as needed for wheezing or shortness of breath.  . anastrozole (ARIMIDEX) 1 MG tablet Take 1 tablet (1 mg total) by mouth daily.  [name] Kitchen aspirin EC 81 MG tablet Take 81 mg by mouth daily.  [name] Kitchen azelastine (ASTELIN) 0.1 % nasal spray Place 1 spray into both nostrils daily. Use in each nostril as directed  . benzonatate (TESSALON) 100 MG capsule Take 100-200 mg by mouth 3 (three) times daily as needed for cough.  . budesonide-formoterol (SYMBICORT) 80-4.5 MCG/ACT inhaler Inhale 2 puffs into the lungs 2 (two) times daily.  . cholecalciferol 5000 units TABS Take 1 tablet (5,000 Units total) by mouth daily.  . citalopram (CELEXA) 40 MG tablet Take 40 mg by mouth daily.   . furosemide (LASIX) 20 MG tablet Take 20 mg by mouth daily as needed.  . hydrOXYzine (ATARAX/VISTARIL) 25 MG tablet Take 1 tablet (25 mg total) by mouth  3 (three) times daily as needed.  [name] Kitchen levocetirizine (XYZAL) 5 MG tablet Take 1 tablet (5 mg total) by mouth every evening.  [name] Kitchen losartan (COZAAR) 100 MG tablet Take 100 mg by mouth every morning.   . montelukast (SINGULAIR) 10 MG tablet Take 10 mg by mouth at bedtime.  . Probiotic Product (ALIGN) 4 MG CAPS Take 1 capsule by mouth daily with breakfast.   . rOPINIRole (REQUIP) 1 MG tablet Take 1-2 mg by mouth at bedtime.   . simvastatin (ZOCOR) 40 MG tablet Take 40 mg by mouth at bedtime.  . topiramate (TOPAMAX) 100 MG tablet Take 100 mg by mouth 2 (two) times daily.   No facility-administered encounter medications on file as of 02/04/2018.     Allergies as of 02/04/2018 - Review Complete 02/04/2018  Allergen Reaction Noted  . Dust mite extract  10/09/2014  . Pollen extract  10/09/2014    Past Medical History:  Diagnosis Date  . Asthma    allergies  . Breast cancer ([place])    07-30-2011-- stage1 dcis  aT1c,pN0, pMX----  s/p left breast partial mastectomy w/ sln bx  and radiation therapy ,  complete 10-28-2011  . Depression   . Hot flashes   . Hyperlipidemia   . Hypertension   . Migraines   . Open breast wound    non-healing left breast surgical wound --- no draining---  dressing change at wound care center  &  hyperbaric oxyogen therapy  .  OSA (obstructive sleep apnea)    NOT USED CPAP SINCE 2012  (PT STATES SLEEP STUDY WAS MILD OSA)  . S/P radiation therapy 09/15/11 - 10/28/11   Left Breast  . Seasonal allergies     Past Surgical History:  Procedure Laterality Date  . BREAST BIOPSY Left 07/07/2012   Procedure: Left Breast Debridement;  Surgeon: Marcello Moores A. Cornett, MD;  Location: [facility];  Service: General;  Laterality: Left;  Left Breast Debridement  . BREAST LUMPECTOMY    . COLONOSCOPY     polyps removed in past  . LUMBAR MICRODISCECTOMY  2005   L4 -- L5  . MASTECTOMY PARTIAL / LUMPECTOMY W/ AXILLARY LYMPHADENECTOMY  07-30-2011   Left Breast : Invasive Ductal Carcinoma: 0/3   Nodes Negative: ER 100%, PR 0%, Her 2Neu Negative  . TONSILLECTOMY  AS CHILD    Family History  Problem Relation Age of Onset  . Cancer Father        brain  . Stroke Father   . Hypertension Mother     Social History   Socioeconomic History  . Marital status: Widowed    Spouse name: Not on file  . Number of children: Not on file  . Years of education: Not on file  . Highest education level: Not on file  Occupational History  . Not on file  Social Needs  . Financial resource strain: Not on file  . Food insecurity:    Worry: Not on file    Inability: Not on file  . Transportation needs:    Medical: Not on file    Non-medical: Not on file  Tobacco Use  . Smoking status: Never Smoker  . Smokeless tobacco: Never Used  Substance and Sexual Activity  . Alcohol use: Yes    Comment: occasional  wine  . Drug use: No  . Sexual activity: Not on file  Lifestyle  . Physical activity:    Days per week: Not on file    Minutes per session: Not on file  . Stress: Not on file  Relationships  . Social connections:    Talks on phone: Not on file    Gets together: Not on file    Attends religious service: Not on file    Active member of club or organization: Not on file    Attends meetings of clubs or organizations: Not on file    Relationship status: Not on file  . Intimate partner violence:    Fear of current or ex partner: Not on file    Emotionally abused: Not on file    Physically abused: Not on file    Forced sexual activity: Not on file  Other Topics Concern  . Not on file  Social History Narrative  . Not on file    Review of Systems  Constitutional: Positive for fatigue.  Eyes: Negative.   Respiratory: Positive for shortness of breath and wheezing.   Cardiovascular: Negative.   Psychiatric/Behavioral: Positive for sleep disturbance.    Vitals:   02/04/18 1620  BP: 138/64  Pulse: (!) 101  SpO2: 100%     Physical Exam  Constitutional: She appears  well-developed and well-nourished.  HENT:  Head: Normocephalic and atraumatic.  Mallampati 3  Eyes: Pupils are equal, round, and reactive to light. Conjunctivae and EOM are normal. Right eye exhibits no discharge. Left eye exhibits no discharge.  Neck: Normal range of motion. Neck supple. No tracheal deviation present. No thyromegaly present.  Cardiovascular: Normal rate and regular rhythm.  Pulmonary/Chest: Effort normal and breath sounds normal. No respiratory distress. She has no wheezes.  Abdominal: Soft. Bowel sounds are normal. She exhibits no distension. There is no abdominal tenderness.  Musculoskeletal: Normal range of motion.        General: No deformity or edema.   Results of the Epworth flowsheet 02/04/2018  Sitting and reading 2  Watching TV 3  Sitting, inactive in a public place (e.g. a theatre or a meeting) 1  As a passenger in a car for an hour without a break 1  Lying down to rest in the afternoon when circumstances permit 0  Sitting and talking to someone 1  Sitting quietly after a lunch without alcohol 1  In a car, while stopped for a few minutes in traffic 0  Total score 9    Data Reviewed: Recent polysomnogram 01/04/2018 did reveal hypoxemia with saturations between 80 and 89% for 89% of the sleep. AHI of 4.6  Assessment:   Moderate probability of significant obstructive sleep apnea The most recent study may not have typified what she does on a nightly basis  Nocturnal hypoxemia  Hypoventilation syndrome  Patient saturations maintained at 100% during this visit  History of multiple allergies   Plan/Recommendations:  We will repeat a polysomnogram to assess for significant sleep disordered breathing  PFT was discussed with the patient-we will wait to order this  Echocardiogram requested-assess for cardiac function and also pulmonary hypertension  Encouraged to continue using Symbicort and albuterol  Encouraged to start exercising on a regular  basis, importance of weight loss  Pathophysiology of sleep disordered breathing discussed with the patient Treatment options discussed with the patient  I will see her back in the office in about 6 weeks  Encouraged to call if any significant concerns  Sherrilyn Rist MD [facility] 02/04/2018, 4:38 PM  CC: Velna Hatchet, MD

## 2018-02-07 ENCOUNTER — Telehealth: Payer: Self-pay | Admitting: Pulmonary Disease

## 2018-02-07 NOTE — Telephone Encounter (Signed)
Called and spoke with pt letting her know AO stated to reschedule her appt to be seen after sleep study. Pt expressed understanding. OV rescheduled for pt 04/21/18 with AO. Nothing further needed.

## 2018-02-07 NOTE — Telephone Encounter (Signed)
Yes, please reschedule for at least 3-4 weeks after the study This allows [place] to start her on treatment if needed before she is seen in the office

## 2018-02-07 NOTE — Telephone Encounter (Signed)
Called and spoke with patient, she stated that she was to have her sleep study on 03/23/18 and she is scheduled to see AO the day after on 03/24/18. She wants to know if this needs to be rescheduled. AO please advise, thank you.

## 2018-02-09 ENCOUNTER — Institutional Professional Consult (permissible substitution): Payer: Medicare Other | Admitting: Pulmonary Disease

## 2018-02-14 ENCOUNTER — Ambulatory Visit (HOSPITAL_COMMUNITY): Payer: Medicare Other | Attending: Cardiovascular Disease

## 2018-02-14 ENCOUNTER — Other Ambulatory Visit: Payer: Self-pay

## 2018-02-14 DIAGNOSIS — R0602 Shortness of breath: Secondary | ICD-10-CM

## 2018-02-15 DIAGNOSIS — Z6838 Body mass index (BMI) 38.0-38.9, adult: Secondary | ICD-10-CM | POA: Diagnosis not present

## 2018-02-15 DIAGNOSIS — J069 Acute upper respiratory infection, unspecified: Secondary | ICD-10-CM | POA: Diagnosis not present

## 2018-02-15 DIAGNOSIS — I10 Essential (primary) hypertension: Secondary | ICD-10-CM | POA: Diagnosis not present

## 2018-03-23 ENCOUNTER — Ambulatory Visit (HOSPITAL_BASED_OUTPATIENT_CLINIC_OR_DEPARTMENT_OTHER): Payer: Medicare Other | Attending: Pulmonary Disease | Admitting: Pulmonary Disease

## 2018-03-23 VITALS — Ht 66.0 in | Wt 224.0 lb

## 2018-03-23 DIAGNOSIS — G478 Other sleep disorders: Secondary | ICD-10-CM

## 2018-03-24 ENCOUNTER — Ambulatory Visit: Payer: Medicare Other | Admitting: Pulmonary Disease

## 2018-03-29 ENCOUNTER — Telehealth: Payer: Self-pay | Admitting: Pulmonary Disease

## 2018-03-29 DIAGNOSIS — G4734 Idiopathic sleep related nonobstructive alveolar hypoventilation: Secondary | ICD-10-CM

## 2018-03-29 NOTE — Telephone Encounter (Signed)
Spoke with pt.  Pt was concerned about ONO as she is going out of town for 5 days leaving on 04/13/18.  Has f/u appt on 04/21/18.  Instructed to leave appt as they are.  Wait to hear from DME about ONO.  If she doen not hear from them by 04/12/18 to call us to change f/u appt.  Nothing further needed.

## 2018-03-29 NOTE — Telephone Encounter (Signed)
Sleep study results  Date of study:03/23/2018  Negative study for sleep disordered breathing  Nocturnal desaturations noted  Recommend overnight oximetry to ascertain oxygen supplementation requirement Weight loss efforts Elevation of the head of the bed by about 30 degrees may help snoring Aggressive weight loss measures  Routine follow up

## 2018-03-29 NOTE — Telephone Encounter (Signed)
I have called and spoke with the patient and reviewed the results she verbalized understanding and ono has been order and I will mail a copy of sleep study to the patient nothing further needed at this time.

## 2018-03-29 NOTE — Procedures (Signed)
  POLYSOMNOGRAPHY  Last, First: Katelyn, Walsh MRN: [id number] Gender: Female Age (years): 68 Weight (lbs): 234 DOB: Feb 28, 1950 BMI: 38 Primary Care: Velna Hatchet Epworth Score: 7 Referring: Laurin Coder MD Technician: Jorge Ny Interpreting: Laurin Coder MD Study Type: NPSG Ordered Study Type: Split Night CPAP Study date: 03/23/2018 Location: [place] CLINICAL INFORMATION Katelyn Walsh is a 68 year old Female and was referred to the sleep center for evaluation of G47.80 Other Sleep Disorders. Indications include Excessive Daytime Sleepiness, Hypertension, Obesity, Snoring.  MEDICATIONS Patient self administered medications include: SINGULAIR, LEVOCETIRIZINE, REQUIP, SIMVASTATIN, TOPIRAMATE. Medications administered during study include No sleep medicine administered.  SLEEP STUDY TECHNIQUE A multi-channel overnight Polysomnography study was performed. The channels recorded and monitored were central and occipital EEG, electrooculogram (EOG), submentalis EMG (chin), nasal and oral airflow, thoracic and abdominal wall motion, anterior tibialis EMG, snore microphone, electrocardiogram, and a pulse oximetry. TECHNICIAN COMMENTS Comments added by Technician: NONE Comments added by Scorer: N/A SLEEP ARCHITECTURE The study was initiated at 10:28:08 PM and terminated at 5:25:42 AM. The total recorded time was 417.6 minutes. EEG confirmed total sleep time was 382.5 minutes yielding a sleep efficiency of 91.6%%. Sleep onset after lights out was 13.0 minutes with a REM latency of 318.5 minutes. The patient spent 2.5%% of the night in stage N1 sleep, 77.4%% in stage N2 sleep, 15.9%% in stage N3 and 4.2% in REM. Wake after sleep onset (WASO) was 22.0 minutes. The Arousal Index was 8.6/hour. RESPIRATORY PARAMETERS There were a total of 2 respiratory disturbances out of which 1 were apneas ( 1 obstructive, 0 mixed, 0 central) and 1 hypopneas. The apnea/hypopnea index (AHI) was 0.3  events/hour. The central sleep apnea index was 0.0 events/hour. The REM AHI was 0.0 events/hour and NREM AHI was 0.3 events/hour. The supine AHI was 0.3 events/hour and the non supine AHI was 0 supine during 100.00% of sleep. Respiratory disturbances were associated with oxygen desaturation down to a nadir of 84.0% during sleep. The mean oxygen saturation during the study was 92.2%. The cumulative time under 88% oxygen saturation was 5.5 minutes.  LEG MOVEMENT DATA The total leg movements were 48 with a resulting leg movement index of 7.5/hr .Associated arousal with leg movement index was 1.9/hr.  CARDIAC DATA The underlying cardiac rhythm was most consistent with sinus rhythm. Mean heart rate during sleep was 70.2 bpm. Additional rhythm abnormalities include None.  IMPRESSIONS - No Significant Obstructive Sleep apnea(OSA) - EKG showed no cardiac abnormalities. - Moderate Oxygen Desaturation - The patient snored with moderate snoring volume. - No significant periodic leg movements(PLMs) during sleep. No significant associated arousals.  DIAGNOSIS - Nocturnal Hypoxemia (327.26 [G47.36 ICD-10]) - Snoring  RECOMMENDATIONS - Avoid alcohol, sedatives and other CNS depressants that may worsen sleep apnea and disrupt normal sleep architecture. - Sleep hygiene should be reviewed to assess factors that may improve sleep quality. - Weight management and regular exercise should be initiated or continued. - Clinical follow up of symptoms, elevation of the head of the bed by about 30 degrees may help snoring. - Encourage adequate amout of nighttime sleep, about 7 hours of sleep. - Overnight oximetry to ascertain need for oxygen supplementation.  [Electronically signed] 03/29/2018 07:49 AM  Sherrilyn Rist MD NPI: [id number]

## 2018-04-20 DIAGNOSIS — J4540 Moderate persistent asthma, uncomplicated: Secondary | ICD-10-CM | POA: Diagnosis not present

## 2018-04-20 DIAGNOSIS — J3081 Allergic rhinitis due to animal (cat) (dog) hair and dander: Secondary | ICD-10-CM | POA: Diagnosis not present

## 2018-04-20 DIAGNOSIS — J019 Acute sinusitis, unspecified: Secondary | ICD-10-CM | POA: Diagnosis not present

## 2018-04-20 DIAGNOSIS — J301 Allergic rhinitis due to pollen: Secondary | ICD-10-CM | POA: Diagnosis not present

## 2018-04-21 ENCOUNTER — Telehealth: Payer: Self-pay | Admitting: Pulmonary Disease

## 2018-04-21 ENCOUNTER — Ambulatory Visit: Payer: Medicare Other | Admitting: Pulmonary Disease

## 2018-04-21 NOTE — Telephone Encounter (Signed)
Called and spoke with the patient she advised that she believes she only sleep 4 hours on the ono.. I let her know per AO that it would be okay. I will route this message to myself to get ono results

## 2018-04-25 ENCOUNTER — Ambulatory Visit: Payer: Medicare Other | Admitting: Pulmonary Disease

## 2018-04-26 NOTE — Telephone Encounter (Signed)
Heather please advise if these ONO results have been received. Thanks!

## 2018-04-28 NOTE — Telephone Encounter (Signed)
Heather please advise thank you.

## 2018-04-29 NOTE — Telephone Encounter (Signed)
I have not recived anything I will follow up on 05/02/2018 with [facility]

## 2018-05-02 NOTE — Telephone Encounter (Signed)
I have called spoke [name] [facility] she states the patient only sleep on the ono for 14 minutes and needs to repeat this I have advised the patient of this and she verbalized understanding. [facility] will contact her to redo this test.  Apt test canceled for tomorrow.

## 2018-05-02 NOTE — Telephone Encounter (Signed)
Pt is calling back [phone]

## 2018-05-02 NOTE — Telephone Encounter (Signed)
Called and spoke with patient, she stated that heather called her in regards to the ONO and stated that the appointment with AO had to be cancelled and she needed to have the ONO done again. Heather please advise patient would like to speak with you.

## 2018-05-03 ENCOUNTER — Ambulatory Visit: Payer: Medicare Other | Admitting: Pulmonary Disease

## 2018-05-09 ENCOUNTER — Telehealth: Payer: Self-pay

## 2018-05-09 DIAGNOSIS — G4734 Idiopathic sleep related nonobstructive alveolar hypoventilation: Secondary | ICD-10-CM

## 2018-05-09 NOTE — Telephone Encounter (Signed)
Called requesting a phone call from Katelyn Walsh she states she was supposed to be getting a pulse oximeter. She states she wanted to confirm the date and address of where she was supposed to be picking it up from. She states she thinks it was Wednesday that her appt was set up for. She will be boarding a plane in about 30 minutes. She asks that heather leave a voicemail if she does not pick up.   Call back # [phone]

## 2018-05-10 NOTE — Telephone Encounter (Signed)
This patient is needing a ono done she is needing to talk to [facility] to set up apt. I have explained this to her before due to her last test only being 14 mins long.   Patient no longer wishes to work with [facility] due to them not calling her back and following threw she would like to use [facility]. I will please order for ono with [facility].

## 2018-05-10 NOTE — Telephone Encounter (Signed)
Pt is returning call. Cb is [phone]

## 2018-05-10 NOTE — Telephone Encounter (Signed)
Called and spoke with patient, she stated that Nira Conn called her and stated that she was ordering the patient a pulse oximeter so that she can check her o2. Patient stated that she needed to know where this was sent to so that she can pick it up. I do not see a prescription for a pulse ox. Patient stated that she would like to talk to [facility] about this as she was working on it. Heather please advise, thank you.

## 2018-05-12 DIAGNOSIS — R0902 Hypoxemia: Secondary | ICD-10-CM | POA: Diagnosis not present

## 2018-05-12 DIAGNOSIS — J449 Chronic obstructive pulmonary disease, unspecified: Secondary | ICD-10-CM | POA: Diagnosis not present

## 2018-05-16 ENCOUNTER — Telehealth: Payer: Self-pay | Admitting: Pulmonary Disease

## 2018-05-16 NOTE — Telephone Encounter (Signed)
Called and spoke with patient she is requesting results from her ONO. Please advise, thank you.

## 2018-05-17 NOTE — Telephone Encounter (Signed)
Pt is calling to see if we have results from ONO. Cb is [phone]

## 2018-05-17 NOTE — Telephone Encounter (Signed)
I have called [facility] they will be sending me the ono results and I will get them to Dr. Ander Slade. Will make a telephone visit for her to talk to the patient after we get the results from [place].

## 2018-05-17 NOTE — Telephone Encounter (Signed)
Called and spoke with patient advised that we are waiting on response from AO. Please advise, thank you.

## 2018-05-17 NOTE — Telephone Encounter (Signed)
No desaturations on ONO

## 2018-05-18 ENCOUNTER — Telehealth: Payer: Self-pay | Admitting: Cardiology

## 2018-05-18 NOTE — Telephone Encounter (Signed)
Pt rescheduled to 07/20/18 at 2 pm.

## 2018-05-18 NOTE — Telephone Encounter (Signed)
Patient contacted A[facility] no need for 02 at this time. Normal ONO.

## 2018-05-18 NOTE — Telephone Encounter (Signed)
New Message          Patient is coming in for a New Patient appointment tomorrow and she is asking if she should keep the appointment or not? Patient also wants  her sister in law to come in with her as well. Pls call and advise

## 2018-05-19 ENCOUNTER — Ambulatory Visit: Payer: Medicare Other | Admitting: Cardiology

## 2018-05-27 ENCOUNTER — Encounter

## 2018-05-31 NOTE — Assessment & Plan Note (Deleted)
Left breast invasive ductal carcinoma, 1.8 cm, T1 C. N0 M0 stage IA ER percent, PR negative, HER-2 negative, grade 2, Ki-67 90%, 3 SLN negative, Oncotype DX recurrence score 18, percent risk of recurrence, status post radiation therapy, Arimidex 1 mg daily was started August 2013 later switched to exemestane then switched to Femara and then switch to tamoxifen and again switched back to Anastrozole Nov 2015.  Anastrozole toxicities: 1. Itching of the skin: in spite of discontinuing anastrozole to 3 months the skin itching has not improved. So she resumed anti-estrogen therapy.  2. Bone density December 2015 revealed a T score of -0.9, normal bone density, this will need to be repeated 3. Fatigue and weight gain: Due to lack of exercise.  Stressed importance of physical exercise and eating healthy diet  Dizziness and neck pain: Much improved neck pain. Weight gain issues: I encouraged her to join weight loss programs.  I provided her information on the weight loss program Patient loves going to [facility]. Frequent upper respiratory infections Renewedher prescription for anastrozole  Breast Cancer Surveillance: 1. Breast exam04/16/2019: Normal 2. Mammogram  10/21/2017 no evidence of malignancy breast density category B 12/15/2016: MRI pelvis: Perianal fistulas  Return to clinic in1 yearfor follow-up.

## 2018-06-01 ENCOUNTER — Telehealth: Payer: Self-pay | Admitting: Hematology and Oncology

## 2018-06-01 NOTE — Telephone Encounter (Signed)
Touched base with patient re webex 4/16. Per patient she has already rescheduled to July and would like to just keep that as she does not have the capability for webex.

## 2018-06-01 NOTE — Telephone Encounter (Signed)
Called patient per 4/8 sch message - unable to reach patient . Left message for patient to call back to reschedule.

## 2018-06-09 ENCOUNTER — Ambulatory Visit: Payer: Medicare Other | Admitting: Hematology and Oncology

## 2018-06-14 ENCOUNTER — Ambulatory Visit: Payer: Medicare Other | Admitting: Cardiology

## 2018-06-14 DIAGNOSIS — F330 Major depressive disorder, recurrent, mild: Secondary | ICD-10-CM | POA: Diagnosis not present

## 2018-06-14 DIAGNOSIS — I10 Essential (primary) hypertension: Secondary | ICD-10-CM | POA: Diagnosis not present

## 2018-06-14 DIAGNOSIS — R6889 Other general symptoms and signs: Secondary | ICD-10-CM | POA: Diagnosis not present

## 2018-06-14 DIAGNOSIS — E1169 Type 2 diabetes mellitus with other specified complication: Secondary | ICD-10-CM | POA: Diagnosis not present

## 2018-06-14 DIAGNOSIS — F419 Anxiety disorder, unspecified: Secondary | ICD-10-CM | POA: Diagnosis not present

## 2018-06-14 DIAGNOSIS — B379 Candidiasis, unspecified: Secondary | ICD-10-CM | POA: Diagnosis not present

## 2018-06-14 DIAGNOSIS — G4733 Obstructive sleep apnea (adult) (pediatric): Secondary | ICD-10-CM | POA: Diagnosis not present

## 2018-06-14 DIAGNOSIS — R0609 Other forms of dyspnea: Secondary | ICD-10-CM | POA: Diagnosis not present

## 2018-07-20 ENCOUNTER — Telehealth: Payer: Self-pay

## 2018-07-20 ENCOUNTER — Encounter: Payer: Self-pay | Admitting: Cardiology

## 2018-07-20 ENCOUNTER — Ambulatory Visit (INDEPENDENT_AMBULATORY_CARE_PROVIDER_SITE_OTHER): Payer: Medicare Other | Admitting: Cardiology

## 2018-07-20 ENCOUNTER — Other Ambulatory Visit: Payer: Self-pay

## 2018-07-20 VITALS — BP 138/80 | HR 84 | Ht 66.0 in | Wt 241.0 lb

## 2018-07-20 DIAGNOSIS — I209 Angina pectoris, unspecified: Secondary | ICD-10-CM | POA: Diagnosis not present

## 2018-07-20 DIAGNOSIS — R0602 Shortness of breath: Secondary | ICD-10-CM

## 2018-07-20 MED ORDER — METOPROLOL TARTRATE 100 MG PO TABS: ORAL_TABLET | ORAL | 0 refills | Status: DC

## 2018-07-20 NOTE — Telephone Encounter (Signed)
    COVID-19 Pre-Screening Questions:  . In the past 7 to 10 days have you had a cough,  shortness of breath, headache, congestion, fever (100 or greater) body aches, chills, sore throat, or sudden loss of taste or sense of smell? NO . Have you been around anyone with known Covid 19? NO . Have you been around anyone who is awaiting Covid 19 test results in the past 7 to 10 days? NO . Have you been around anyone who has been exposed to Covid 19, or has mentioned symptoms of Covid 19 within the past 7 to 10 days? NO

## 2018-07-20 NOTE — Patient Instructions (Signed)
Medication Instructions:  Your physician recommends that you continue on your current medications as directed. Please refer to the Current Medication list given to you today.  If you need a refill on your cardiac medications before your next appointment, please call your pharmacy.   Lab work: TODAY: BMET  If you have labs (blood work) drawn today and your tests are completely normal, you will receive your results only by: [name] Kitchen MyChart Message (if you have MyChart) OR . A paper copy in the mail If you have any lab test that is abnormal or we need to change your treatment, we will call you to review the results.  Testing/Procedures: Your physician has requested that you have cardiac CT. Cardiac computed tomography (CT) is a painless test that uses an x-ray machine to take clear, detailed pictures of your heart. For further information please visit [facility]uge[facility]iesta[facility]tn. Please follow instruction sheet as given.   Follow-Up: Based on test results  Any Other Special Instructions Will Be Listed Below (If Applicable).  CARDIAC CT INSTRUCTIONS  Please arrive at the [facility] main entrance of [facility] on ___ at ___ (30-45 minutes prior to test start time)  [facility] [place], [place] [address] [phone]  Proceed to the [facility] Radiology Department (First Floor).  Please follow these instructions carefully (unless otherwise directed):    On the Night Before the Test: . Be sure to Drink plenty of water. . Do not consume any caffeinated/decaffeinated beverages or chocolate 12 hours prior to your test. . Do not take any antihistamines 12 hours prior to your test. .   On the Day of the Test: . Drink plenty of water. Do not drink any water within one hour of the test. . Do not eat any food 4 hours prior to the test. . You may take your regular medications prior to the test.  . Take metoprolol (Lopressor) two hours prior to test. . HOLD  Furosemide morning of the test.       After the Test: . Drink plenty of water. . After receiving IV contrast, you may experience a mild flushed feeling. This is normal. . On occasion, you may experience a mild rash up to 24 hours after the test. This is not dangerous. If this occurs, you can take Benadryl 25 mg and increase your fluid intake. . If you experience trouble breathing, this can be serious. If it is severe call 911 IMMEDIATELY. If it is mild, please call our office.

## 2018-07-20 NOTE — Progress Notes (Signed)
Cardiology Office Note:    Date:  07/20/2018   ID:  Katelyn Walsh, DOB 08-10-1950, MRN [id number]  PCP:  Velna Hatchet, MD  Cardiologist:  Candee Furbish, MD  Electrophysiologist:  None   Referring MD: Velna Hatchet, MD   Shortness of breath complaint  History of Present Illness:    Katelyn Walsh is a 68 y.o. female here for the evaluation of shortness of breath.  At the request of Dr. Ardeth Perfect.  Breast cancer status post XRT and lumpectomy.  Hypertension hyperlipidemia anxiety migraines.  Lumpectomy took place in 2013.  After walking a block still has shortness of breath.  Obesity.  When walking up a hill for instance she wants to be able to keep up with her friends.  This is been harder and harder over the last few years.  She does admit that she had gained a significant amount of weight especially surrounding her breast cancer treatment.  Father died of glioblastoma had stroke at the age greater than 36 hypertension gout.  Inhalers for asthma. Bad allergies.   She is a retired Marine scientist for [employer]  Widowed.  LDL 63 hemoglobin 12.8  Past Medical History:  Diagnosis Date  . Acute mastitis of left breast 02/09/2012  . Asthma    allergies  . Breast abscess 01/12/2012  . Breast cancer ([place])    07-30-2011-- stage1 dcis  aT1c,pN0, pMX----  s/p left breast partial mastectomy w/ sln bx  and radiation therapy ,  complete 10-28-2011  . Depression   . Hot flashes   . Hyperlipidemia   . Hypertension   . Mastitis chronic 04/15/2012  . Migraines   . Open breast wound    non-healing left breast surgical wound --- no draining---  dressing change at wound care center  &  hyperbaric oxyogen therapy  . OSA (obstructive sleep apnea)    NOT USED CPAP SINCE 2012  (PT STATES SLEEP STUDY WAS MILD OSA)  . Primary cancer of upper inner quadrant of left female breast ([place]) 06/29/2011   ER 100%  PR 0%  Her 2 Neu 1.51  Ki67 6%   . Rash and nonspecific skin eruption 08/13/2011  . S/P radiation  therapy 09/15/11 - 10/28/11   Left Breast  . Seasonal allergies   . Vitamin B12 deficiency anemia 07/31/2014    Past Surgical History:  Procedure Laterality Date  . BREAST BIOPSY Left 07/07/2012   Procedure: Left Breast Debridement;  Surgeon: Marcello Moores A. Cornett, MD;  Location: [facility];  Service: General;  Laterality: Left;  Left Breast Debridement  . BREAST LUMPECTOMY    . COLONOSCOPY     polyps removed in past  . LUMBAR MICRODISCECTOMY  2005   L4 -- L5  . MASTECTOMY PARTIAL / LUMPECTOMY W/ AXILLARY LYMPHADENECTOMY  07-30-2011   Left Breast : Invasive Ductal Carcinoma: 0/3  Nodes Negative: ER 100%, PR 0%, Her 2Neu Negative  . TONSILLECTOMY  AS CHILD    Current Medications: Current Meds  Medication Sig  . albuterol (PROVENTIL HFA;VENTOLIN HFA) 108 (90 Base) MCG/ACT inhaler Inhale 2 puffs into the lungs every 6 (six) hours as needed for wheezing or shortness of breath.  . anastrozole (ARIMIDEX) 1 MG tablet Take 1 tablet (1 mg total) by mouth daily.  [name] Kitchen aspirin EC 81 MG tablet Take 81 mg by mouth daily.  . benzonatate (TESSALON) 100 MG capsule Take 100-200 mg by mouth 3 (three) times daily as needed for cough.  . budesonide-formoterol (SYMBICORT) 80-4.5 MCG/ACT inhaler Inhale 2  puffs into the lungs 2 (two) times daily.  . cholecalciferol 5000 units TABS Take 1 tablet (5,000 Units total) by mouth daily.  . citalopram (CELEXA) 40 MG tablet Take 40 mg by mouth daily.   . furosemide (LASIX) 20 MG tablet Take 20 mg by mouth daily as needed.  . hydrOXYzine (ATARAX/VISTARIL) 25 MG tablet Take 1 tablet (25 mg total) by mouth 3 (three) times daily as needed.  [name] Kitchen levocetirizine (XYZAL) 5 MG tablet Take 1 tablet (5 mg total) by mouth every evening.  [name] Kitchen losartan (COZAAR) 100 MG tablet Take 100 mg by mouth every morning.   . montelukast (SINGULAIR) 10 MG tablet Take 10 mg by mouth at bedtime.  . Probiotic Product (ALIGN) 4 MG CAPS Take 1 capsule by mouth daily with breakfast.   . rOPINIRole (REQUIP) 1 MG  tablet Take 1-2 mg by mouth at bedtime.   . simvastatin (ZOCOR) 40 MG tablet Take 40 mg by mouth at bedtime.  . topiramate (TOPAMAX) 100 MG tablet Take 100 mg by mouth 2 (two) times daily.     Allergies:   Dust mite extract and Pollen extract   Social History   Socioeconomic History  . Marital status: Widowed    Spouse name: Not on file  . Number of children: Not on file  . Years of education: Not on file  . Highest education level: Not on file  Occupational History  . Not on file  Social Needs  . Financial resource strain: Not on file  . Food insecurity:    Worry: Not on file    Inability: Not on file  . Transportation needs:    Medical: Not on file    Non-medical: Not on file  Tobacco Use  . Smoking status: Never Smoker  . Smokeless tobacco: Never Used  Substance and Sexual Activity  . Alcohol use: Yes    Comment: occasional  wine  . Drug use: No  . Sexual activity: Not on file  Lifestyle  . Physical activity:    Days per week: Not on file    Minutes per session: Not on file  . Stress: Not on file  Relationships  . Social connections:    Talks on phone: Not on file    Gets together: Not on file    Attends religious service: Not on file    Active member of club or organization: Not on file    Attends meetings of clubs or organizations: Not on file    Relationship status: Not on file  Other Topics Concern  . Not on file  Social History Narrative  . Not on file     Family History: The patient's family history includes Cancer in her father; Hypertension in her mother; Stroke in her father.  ROS:   Please see the history of present illness.    Denies any fevers chills nausea vomiting syncope bleeding all other systems reviewed and are negative.  EKGs/Labs/Other Studies Reviewed:    The following studies were reviewed today:  02/14/2018: - Left ventricle: The cavity size was normal. There was mild   concentric hypertrophy. Systolic function was vigorous.  The   estimated ejection fraction was in the range of 65% to 70%. Wall   motion was normal; there were no regional wall motion   abnormalities. Doppler parameters are consistent with abnormal   left ventricular relaxation (grade 1 diastolic dysfunction). - Aortic valve: Transvalvular velocity was within the normal range.   There was no stenosis. There was  no regurgitation. - Mitral valve: Transvalvular velocity was within the normal range.   There was no evidence for stenosis. There was no regurgitation. - Left atrium: The atrium was moderately dilated. - Right ventricle: The cavity size was normal. Wall thickness was   normal. Systolic function was normal. - Atrial septum: No defect or patent foramen ovale was identified   by color flow Doppler. - Tricuspid valve: There was trivial regurgitation. - Pulmonary arteries: Systolic pressure was within the normal   range. PA peak pressure: 15 mm Hg (S). - Global longitudinal strain -19.9% (normal).  EKG: 07/20/2018-sinus rhythm 74 no other changes.  Personally reviewed  Recent Labs: No results found for requested labs within last 8760 hours.  Recent Lipid Panel No results found for: CHOL, TRIG, HDL, CHOLHDL, VLDL, LDLCALC, LDLDIRECT  Physical Exam:    VS:  BP 138/80   Pulse 84   Ht _0  (1.676 m)   Wt 241 lb (109.3 kg)   SpO2 98%   BMI 38.90 kg/m     Wt Readings from Last 3 Encounters:  07/20/18 241 lb (109.3 kg)  03/23/18 224 lb (101.6 kg)  02/04/18 233 lb 6.4 oz (105.9 kg)     GEN: Obese Well nourished, well developed in no acute distress HEENT: Normal NECK: No JVD; No carotid bruits LYMPHATICS: No lymphadenopathy CARDIAC: RRR, no murmurs, rubs, gallops RESPIRATORY:  Clear to auscultation without rales, wheezing or rhonchi  ABDOMEN: Soft, non-tender, non-distended MUSCULOSKELETAL:  No edema; No deformity  SKIN: Warm and dry NEUROLOGIC:  Alert and oriented x 3 PSYCHIATRIC:  Normal affect   ASSESSMENT:    1. Angina  pectoris ([place])   2. SOB (shortness of breath)    PLAN:    In order of problems listed above:  Dyspnea on exertion which may be angina equivalent - We will go ahead and check a coronary CT scan with possible FFR analysis to make sure that we get a good look at her coronary artery tree.  Severe blockages/obstruction could be the cause for her current symptoms.  On the other hand, certainly with her weight gain, deconditioning could be playing a role as well.  Echocardiogram demonstrated normal ejection fraction with only grade 1 diastolic dysfunction.  The other thought is, she does state that she has pretty significant asthma, could this be playing a role?  Morbid obesity -Continue to encourage weight loss, increase physical exertion   Medication Adjustments/Labs and Tests Ordered: Current medicines are reviewed at length with the patient today.  Concerns regarding medicines are outlined above.  Orders Placed This Encounter  Procedures  . CT CORONARY MORPH W/CTA COR W/SCORE W/CA W/CM &/OR WO/CM  . CT CORONARY FRACTIONAL FLOW RESERVE DATA PREP  . CT CORONARY FRACTIONAL FLOW RESERVE FLUID ANALYSIS  . Basic metabolic panel  . EKG 12-Lead   Meds ordered this encounter  Medications  . metoprolol tartrate (LOPRESSOR) 100 MG tablet    Sig: Take 1 tablet 2 hours prior to Cardiac CT    Dispense:  1 tablet    Refill:  0    Patient Instructions  Medication Instructions:  Your physician recommends that you continue on your current medications as directed. Please refer to the Current Medication list given to you today.  If you need a refill on your cardiac medications before your next appointment, please call your pharmacy.   Lab work: TODAY: BMET  If you have labs (blood work) drawn today and your tests are completely normal, you will receive  your results only by: [name] Kitchen MyChart Message (if you have MyChart) OR . A paper copy in the mail If you have any lab test that is abnormal or we  need to change your treatment, we will call you to review the results.  Testing/Procedures: Your physician has requested that you have cardiac CT. Cardiac computed tomography (CT) is a painless test that uses an x-ray machine to take clear, detailed pictures of your heart. For further information please visit [facility]uge[facility]iesta[facility][email or web address]. Please follow instruction sheet as given.   Follow-Up: Based on test results  Any Other Special Instructions Will Be Listed Below (If Applicable).  CARDIAC CT INSTRUCTIONS  Please arrive at the [facility] main entrance of [facility] on ___ at ___ (30-45 minutes prior to test start time)  [facility] [place], [place] [address] [phone]  Proceed to the [facility] Radiology Department (First Floor).  Please follow these instructions carefully (unless otherwise directed):    On the Night Before the Test: . Be sure to Drink plenty of water. . Do not consume any caffeinated/decaffeinated beverages or chocolate 12 hours prior to your test. . Do not take any antihistamines 12 hours prior to your test. .   On the Day of the Test: . Drink plenty of water. Do not drink any water within one hour of the test. . Do not eat any food 4 hours prior to the test. . You may take your regular medications prior to the test.  . Take metoprolol (Lopressor) two hours prior to test. . HOLD Furosemide morning of the test.       After the Test: . Drink plenty of water. . After receiving IV contrast, you may experience a mild flushed feeling. This is normal. . On occasion, you may experience a mild rash up to 24 hours after the test. This is not dangerous. If this occurs, you can take Benadryl 25 mg and increase your fluid intake. . If you experience trouble breathing, this can be serious. If it is severe call 911 IMMEDIATELY. If it is mild, please call our office.       Signed, Candee Furbish, MD  07/20/2018 4:28 PM    [place]

## 2018-07-21 ENCOUNTER — Telehealth (HOSPITAL_COMMUNITY): Payer: Self-pay | Admitting: Emergency Medicine

## 2018-07-21 NOTE — Telephone Encounter (Signed)
Left message on voicemail with name and callback number Sindhu Nguyen RN Navigator Cardiac Imaging  Heart and Vascular Services [phone] Office [phone] Cell  

## 2018-07-22 ENCOUNTER — Encounter (HOSPITAL_COMMUNITY): Payer: Self-pay

## 2018-07-22 ENCOUNTER — Other Ambulatory Visit: Payer: Self-pay

## 2018-07-22 ENCOUNTER — Ambulatory Visit (HOSPITAL_COMMUNITY): Payer: Medicare Other

## 2018-07-22 ENCOUNTER — Ambulatory Visit (HOSPITAL_COMMUNITY)
Admission: RE | Admit: 2018-07-22 | Discharge: 2018-07-22 | Disposition: A | Discharge status: Home / Self Care | Payer: Medicare Other | Source: Ambulatory Visit | Attending: Cardiology | Admitting: Cardiology

## 2018-07-22 DIAGNOSIS — R0602 Shortness of breath: Secondary | ICD-10-CM

## 2018-07-22 DIAGNOSIS — I209 Angina pectoris, unspecified: Secondary | ICD-10-CM | POA: Diagnosis not present

## 2018-07-22 LAB — BASIC METABOLIC PANEL
BUN/Creatinine Ratio: 18 (ref 12–28)
BUN: 17 mg/dL (ref 8–27)
CO2: 16 mmol/L — ABNORMAL LOW (ref 20–29)
Calcium: 9.5 mg/dL (ref 8.7–10.3)
Chloride: 105 mmol/L (ref 96–106)
Creatinine, Ser: 0.96 mg/dL (ref 0.57–1.00)
GFR calc Af Amer: 70 mL/min/{1.73_m2} (ref >59)
GFR calc non Af Amer: 61 mL/min/{1.73_m2} (ref >59)
Glucose: 108 mg/dL — ABNORMAL HIGH (ref 65–99)
Potassium: 4.5 mmol/L (ref 3.5–5.2)
Sodium: 140 mmol/L (ref 134–144)

## 2018-07-22 MED ORDER — IOHEXOL 350 MG/ML SOLN
100.0000 mL | Freq: Once | INTRAVENOUS | Status: AC | PRN
  Administered 2018-07-22: 100 mL via INTRAVENOUS

## 2018-07-22 MED ORDER — NITROGLYCERIN 0.4 MG SL SUBL
0.8000 mg | SUBLINGUAL_TABLET | Freq: Once | SUBLINGUAL | Status: AC
  Administered 2018-07-22: 0.8 mg via SUBLINGUAL

## 2018-07-22 MED ORDER — NITROGLYCERIN 0.4 MG SL SUBL
SUBLINGUAL_TABLET | SUBLINGUAL | Status: AC
  Filled 2018-07-22: qty 2

## 2018-07-22 NOTE — Progress Notes (Signed)
Exam completed.  Pt C/O slight H/A after exam.  Pt given crackers and caffeine beverage.  PIV removed, dressing applied.  Verbal and written instructions given to patient.  Pt discharged

## 2018-07-22 NOTE — Discharge Instructions (Signed)
What You Need to Know About IV Contrast Material °IV contrast material is most often a fluid that is used with some imaging tests. Contrast material is injected into your body through a vein to help your health care providers see your organs and tissues more clearly. It may be used with: °· X-ray. °· MRI. °· CT. °· Ultrasound. °Contrast material is used when your health care providers need a detailed look at organs, tissues, or blood vessels that may not show up with the standard test. IV contrast may be used for imaging tests that examine: °· Muscles, skin, and fat. °· Breasts. °· Brain. °· Digestive tract. °· Heart. °· Liver. °· Lungs and many other internal organs. °What are the risks of using IV contrast material? °The risks of using IV contrast material include: °· Headache. °· Itching, skin rash, and hives. °· Allergic reactions. °· Nausea and vomiting. °· Wheezing or difficulty breathing. °· Abnormal heart rate. °· Blood pressure changes. °· Throat swelling. °· Kidney damage. °These complications are more likely to occur in people who: °· Have kidney failure. °· Have liver problems. °· Have certain heart problems, including: °? Heart failure. °? Heart attack. °? Heart infection. °? Heart valve problems. °· Abuse alcohol. °· Have allergies or asthma. °· Are dehydrated. °· Have sickle cell anemia or similar problems. °· Have had trouble with IV contrast material in the past. °· Take certain medicines, such as: °? Metformin. °? NSAIDs. °? Beta blockers. °? Interleukin-2. °How do I prepare for my test with IV contrast material? °· Follow instructions from your health care provider about eating or drinking restrictions. °· Ask your health care provider about changing or stopping your regular medicines. This is especially important if you are taking diabetes medicines or blood thinners. °· Tell your health care provider about: °? Any previous illnesses, surgeries, or pre-existing medical conditions. °? Whether you  are pregnant or may be pregnant. °? Whether you are breastfeeding. Most contrast agents are safe for use in breastfeeding women. °· You may have a physical exam to determine any potential risks. °· Ask if you will be given a medicine (sedative) to help you relax during the procedure. If so, plan to have someone take you home after test. °What happens during the test with IV contrast material? ° °· You may be given a sedative to help you relax. °· A needle will be inserted into one of your veins to administer the IV contrast material. °· You may feel warmth or flushing as the material enters your bloodstream. °· You may have a metallic taste in your mouth for a few minutes. °· The needle may cause some discomfort and bruising. °· After the contrast material is in your body, the imaging test will be done. °The procedure may vary among health care providers and hospitals. °What happens after the test with IV contrast material? °· You may be asked to drink water or other fluids to wash (flush) the contrast material out of your body. °· Drink enough fluid to keep your urine pale yellow. °· Do not drive for 24 hours if you received a sedative. °· It is your responsibility to get your test results. Ask your health care provider or the department performing the test when your results will be ready. °Contact a health care provider if: °· You have redness, swelling, or pain near your IV site. °Get help right away if: °· You have an abnormal heart rhythm. °· You have trouble breathing. °· You have: °?   Chest pain. ? Pain in your back, neck, arm, jaw, or stomach. ? Nausea or sweating. ? Hives or a rash.  You start shaking and cannot stop. These symptoms may represent a serious problem that is an emergency. Do not wait to see if the symptoms will go away. Get medical help right away. Call your local emergency services (911 in the [place] Do not drive yourself to the hospital. Summary  IV contrast may be used for imaging  tests to help your health care providers see your organs and tissues more clearly.  Tell your health care provider if you are pregnant or may be pregnant.  During the procedure, you may feel warmth or flushing as the material enters your bloodstream.  After the procedure, drink enough fluid to keep your urine pale yellow. This information is not intended to replace advice given to you by your health care provider. Make sure you discuss any questions you have with your health care provider. Document Released: 01/28/2009 Document Revised: 10/04/2017 Document Reviewed: 10/17/2014 Elsevier Interactive Patient Education  2019 [employer].   Cardiac CT Angiogram  A cardiac CT angiogram is a procedure to look at the heart and the area around the heart. It may be done to help find the cause of chest pains or other symptoms of heart disease. During this procedure, a large X-ray machine, called a CT scanner, takes detailed pictures of the heart and the surrounding area after a dye (contrast material) has been injected into blood vessels in the area. The procedure is also sometimes called a coronary CT angiogram, coronary artery scanning, or CTA. A cardiac CT angiogram allows the health care provider to see how well blood is flowing to and from the heart. The health care provider will be able to see if there are any problems, such as:  Blockage or narrowing of the coronary arteries in the heart.  Fluid around the heart.  Signs of weakness or disease in the muscles, valves, and tissues of the heart. Tell a health care provider about:  Any allergies you have. This is especially important if you have had a previous allergic reaction to contrast dye.  All medicines you are taking, including vitamins, herbs, eye drops, creams, and over-the-counter medicines.  Any blood disorders you have.  Any surgeries you have had.  Any medical conditions you have.  Whether you are pregnant or may be  pregnant.  Any anxiety disorders, chronic pain, or other conditions you have that may increase your stress or prevent you from lying still. What are the risks? Generally, this is a safe procedure. However, problems may occur, including:  Bleeding.  Infection.  Allergic reactions to medicines or dyes.  Damage to other structures or organs.  Kidney damage from the dye or contrast that is used.  Increased risk of cancer from radiation exposure. This risk is low. Talk with your health care provider about: ? The risks and benefits of testing. ? How you can receive the lowest dose of radiation. What happens before the procedure?  Wear comfortable clothing and remove any jewelry, glasses, dentures, and hearing aids.  Follow instructions from your health care provider about eating and drinking. This may include: ? For 12 hours before the test -- avoid caffeine. This includes tea, coffee, soda, energy drinks, and diet pills. Drink plenty of water or other fluids that do not have caffeine in them. Being well-hydrated can prevent complications. ? For 4-6 hours before the test -- stop eating and drinking. The  contrast dye can cause nausea, but this is less likely if your stomach is empty.  Ask your health care provider about changing or stopping your regular medicines. This is especially important if you are taking diabetes medicines, blood thinners, or medicines to treat erectile dysfunction. What happens during the procedure?  Hair on your chest may need to be removed so that small sticky patches called electrodes can be placed on your chest. These will transmit information that helps to monitor your heart during the test.  An IV tube will be inserted into one of your veins.  You might be given a medicine to control your heart rate during the test. This will help to ensure that good images are obtained.  You will be asked to lie on an exam table. This table will slide in and out of the CT  machine during the procedure.  Contrast dye will be injected into the IV tube. You might feel warm, or you may get a metallic taste in your mouth.  You will be given a medicine (nitroglycerin) to relax (dilate) the arteries in your heart.  The table that you are lying on will move into the CT machine tunnel for the scan.  The person running the machine will give you instructions while the scans are being done. You may be asked to: ? Keep your arms above your head. ? Hold your breath. ? Stay very still, even if the table is moving.  When the scanning is complete, you will be moved out of the machine.  The IV tube will be removed. The procedure may vary among health care providers and hospitals. What happens after the procedure?  You might feel warm, or you may get a metallic taste in your mouth from the contrast dye.  You may have a headache from the nitroglycerin.  After the procedure, drink water or other fluids to wash (flush) the contrast material out of your body.  Contact a health care provider if you have any symptoms of allergy to the contrast. These symptoms include: ? Shortness of breath. ? Rash or hives. ? A racing heartbeat.  Most people can return to their normal activities right after the procedure. Ask your health care provider what activities are safe for you.  It is up to you to get the results of your procedure. Ask your health care provider, or the department that is doing the procedure, when your results will be ready. Summary  A cardiac CT angiogram is a procedure to look at the heart and the area around the heart. It may be done to help find the cause of chest pains or other symptoms of heart disease.  During this procedure, a large X-ray machine, called a CT scanner, takes detailed pictures of the heart and the surrounding area after a dye (contrast material) has been injected into blood vessels in the area.  Ask your health care provider about changing or  stopping your regular medicines before the procedure. This is especially important if you are taking diabetes medicines, blood thinners, or medicines to treat erectile dysfunction.  After the procedure, drink water or other fluids to wash (flush) the contrast material out of your body. This information is not intended to replace advice given to you by your health care provider. Make sure you discuss any questions you have with your health care provider. Document Released: 01/23/2008 Document Revised: 12/30/2015 Document Reviewed: 12/30/2015 Elsevier Interactive Patient Education  2019 [employer].

## 2018-07-28 ENCOUNTER — Telehealth: Payer: Self-pay | Admitting: *Deleted

## 2018-07-28 DIAGNOSIS — I2510 Atherosclerotic heart disease of native coronary artery without angina pectoris: Secondary | ICD-10-CM

## 2018-07-28 DIAGNOSIS — E785 Hyperlipidemia, unspecified: Secondary | ICD-10-CM

## 2018-07-28 DIAGNOSIS — I10 Essential (primary) hypertension: Secondary | ICD-10-CM

## 2018-07-28 MED ORDER — ROSUVASTATIN CALCIUM 20 MG PO TABS: 20.0000 mg | ORAL_TABLET | Freq: Every day | ORAL | 11 refills | Status: DC

## 2018-07-28 NOTE — Telephone Encounter (Signed)
Pt has been notified of CT results and recommendations. Pt is agreeable to change from Simvastatin 40 mg to Crestor 20 mg daily. New Rx has been sent to [facility] Crestor 20 mg daily. Pt aware I will send in new Rx as 30 dys and if she does well on Crestor and wants a 90 dys to just let us know and we will send in as 90 dys. Labs 09/26/18 FLP/ALT. I will send results to [facility]. Pt thanked me for taking the time to explain to her the test results. She did ask if Dr. Marlou Porch would maybe refer her to a Nutritionist to help with a better diet plan for her. I stated to I will pass my note along to Dr. Marlou Porch and his nurse Jeannene Patella and we will let her know about referral recommendation. Orders have been placed for labs, appt made and new Rx has been sent in and med list reflects change.

## 2018-08-02 NOTE — Telephone Encounter (Signed)
Pt is asking for a referral to a nutritionist.  Will forward to Dr Marlou Porch for review and any orders.

## 2018-08-02 NOTE — Telephone Encounter (Signed)
Pt is aware the order has been placed and she will be contacted to be scheduled.  She expressed gratitude for the call back and information.  She will call back should any further concerns arise.

## 2018-08-02 NOTE — Telephone Encounter (Signed)
Order has been placed for referral as instructed.

## 2018-08-02 NOTE — Telephone Encounter (Signed)
OK to refer to nutritionist. Thanks Candee Furbish, MD

## 2018-08-19 ENCOUNTER — Encounter: Payer: Self-pay | Admitting: Dietician

## 2018-08-19 ENCOUNTER — Other Ambulatory Visit: Payer: Self-pay

## 2018-08-19 ENCOUNTER — Encounter: Payer: Medicare Other | Attending: Cardiology | Admitting: Dietician

## 2018-08-19 DIAGNOSIS — Z6839 Body mass index (BMI) 39.0-39.9, adult: Secondary | ICD-10-CM | POA: Insufficient documentation

## 2018-08-19 DIAGNOSIS — E785 Hyperlipidemia, unspecified: Secondary | ICD-10-CM | POA: Insufficient documentation

## 2018-08-19 DIAGNOSIS — I2510 Atherosclerotic heart disease of native coronary artery without angina pectoris: Secondary | ICD-10-CM | POA: Insufficient documentation

## 2018-08-19 DIAGNOSIS — I10 Essential (primary) hypertension: Secondary | ICD-10-CM | POA: Diagnosis not present

## 2018-08-19 NOTE — Progress Notes (Signed)
  Medical Nutrition Therapy:  Appt start time: 0258 end time:  1440.   Assessment:  Primary concerns today: She states that her cardiac CT showed CAD.  She would like to learn meals that are easy. She would like to learn how to read labels and healthier choices when eating out.  History includes corinary artery calcification, HTN, HLD, and morbid obesely and history of breast cancer. Weight today 238 lbs.  BMI 39 She states that she has gained weight since COVID as she is not getting out shopping (decreased exercise) as well as eating out of boredom.   Labs unknown except for LDL of 63 per MD note 5/27. She is on a cholesterol medication but does not remember the name.  Patient lives alone.  Her husband died when he was 64.  Her mother is in [place] home.  She is a retired [employer] scientist.  Preferred Learning Style:   No preference indicated   Learning Readiness:   Ready  DIETARY INTAKE:  24-hr recall:  B ( AM): 2 eggs cooked in PAM, 2 strips bacon, 2 slices dry white wheat toast, coffee with regular half and half (3T) OR honey nut cheerios, skim half and half OR skips 1-2 times per week Snk ( AM): none  L (1-3PM): TV dinner (Marie Calendar- beef shepard pie OR Stouffer[employer] OR Lean Cuisine OR [employer] Chang) Snk ( PM): Klondike bar occasionally D ( PM): Steamed Cabbage, tomato OR frozen meal OR potato soup OR pinto beans with 3-4 strips bacon) and occasional fruit OR frozen limas Snk ( PM): 4-5 spoons peanut butter (reduced fat) Beverages: <8 ounces per day, coffee with half and half, Diet Coke (3-4 per day)  Usual physical activity: none  Estimated energy needs: 1500-1700 calories 70 g protein   Progress Towards Goal(s):  In progress.   Nutritional Diagnosis:  NB-1.1 Food and nutrition-related knowledge deficit As related to healthy eating.  As evidenced by diet hx and patient report.    Intervention:  Nutrition education related to healthy eating.  Discussed types of fat,  label reading, simple meal ideas, benefits of fiber.  Plan: Buy more [employer] and less Diet Coke.  Consider some form of exercise most days.  Start with 10 minutes 1-2 times per day and increase slowly to goal of 30 minutes per day.  Walking  Google "Walk away the pounds" or "Arm Chair Exercise" or "Senior Dance exercises"   Berniece Salines has increased amounts of saturated fat.  Use less of that. Choose fat free half and half for your coffee  Increase vegetables and fruit and beans Read labels and make informed decisions.  Whole should be the first word on the ingredient list for bread  Avoid all partially hydrogenated fats or hydrogenated fats  Choose olive oil or canola oil and use in small amounts  Saturated fat should be no more than 1/3 or less of the total fat  TV dinners aim for only about 400 calories per meal    Teaching Method Utilized:  Visual Auditory  Handouts given during visit include:  Heart Healthy label reading tips  Mediterranean diet  1500 calorie sample meal plan  My plate  Armchair exercises  Types of Fat  Cholesterol and Triglycerides  Barriers to learning/adherence to lifestyle change: does not want to spend a lot of time cooking  Demonstrated degree of understanding via:  Teach Back   Monitoring/Evaluation:  Dietary intake, exercise, and body weight in 2 months.

## 2018-08-19 NOTE — Patient Instructions (Addendum)
Buy more [employer] and less Diet Coke.  Consider some form of exercise most days.  Start with 10 minutes 1-2 times per day and increase slowly to goal of 30 minutes per day.  Walking  Google "Walk away the pounds" or "Arm Chair Exercise" or "Senior Dance exercises"   Katelyn Walsh has increased amounts of saturated fat.  Use less of that. Choose fat free half and half for your coffee  Increase vegetables and fruit and beans Read labels and make informed decisions.  Whole should be the first word on the ingredient list for bread  Avoid all partially hydrogenated fats or hydrogenated fats  Choose olive oil or canola oil and use in small amounts  Saturated fat should be no more than 1/3 or less of the total fat  TV dinners aim for only about 400 calories per meal

## 2018-08-22 DIAGNOSIS — L299 Pruritus, unspecified: Secondary | ICD-10-CM | POA: Diagnosis not present

## 2018-08-22 DIAGNOSIS — J3081 Allergic rhinitis due to animal (cat) (dog) hair and dander: Secondary | ICD-10-CM | POA: Diagnosis not present

## 2018-08-22 DIAGNOSIS — J3089 Other allergic rhinitis: Secondary | ICD-10-CM | POA: Diagnosis not present

## 2018-08-22 DIAGNOSIS — J301 Allergic rhinitis due to pollen: Secondary | ICD-10-CM | POA: Diagnosis not present

## 2018-08-25 ENCOUNTER — Telehealth: Payer: Self-pay | Admitting: Hematology and Oncology

## 2018-08-25 NOTE — Telephone Encounter (Signed)
I talk with patient and she prefers in person visit

## 2018-08-25 NOTE — Assessment & Plan Note (Deleted)
Left breast invasive ductal carcinoma, 1.8 cm, T1 C. N0 M0 stage IA ER percent, PR negative, HER-2 negative, grade 2, Ki-67 90%, 3 SLN negative, Oncotype DX recurrence score 18, percent risk of recurrence, status post radiation therapy, Arimidex 1 mg daily was started August 2013 later switched to exemestane then switched to Femara and then switch to tamoxifen and again switched back to Anastrozole Nov 2015.  Anastrozole toxicities: 1. Itching of the skin: in spite of discontinuing anastrozole to 3 months the skin itching has not improved. So she resumed anti-estrogen therapy.  2. Bone density December 2015 revealed a T score of -0.9, normal bone density, this will need to be repeated 3. Fatigue and weight gain: Due to lack of exercise.  Stressed importance of physical exercise and eating healthy diet  Dizziness and neck pain: Much improved neck pain. Weight gain issues: I encouraged her to join weight loss programs.  I provided her information on the weight loss program Patient loves going to [facility]. Frequent upper respiratory infections Renewedher prescription for anastrozole  Breast Cancer Surveillance: 1. Breast exam04/16/2019: Normal 2. Mammogram 10/21/2017 no evidence of malignancy breast density category B 12/15/2016: MRI pelvis: Perianal fistulous  Return to clinic in1 yearfor follow-up.

## 2018-09-01 ENCOUNTER — Ambulatory Visit: Payer: Medicare Other | Admitting: Hematology and Oncology

## 2018-09-26 ENCOUNTER — Other Ambulatory Visit: Payer: Medicare Other

## 2018-09-26 NOTE — Assessment & Plan Note (Signed)
Left breast invasive ductal carcinoma, 1.8 cm, T1 C. N0 M0 stage IA ER percent, PR negative, HER-2 negative, grade 2, Ki-67 90%, 3 SLN negative, Oncotype DX recurrence score 18, percent risk of recurrence, status post radiation therapy, Arimidex 1 mg daily was started August 2013 later switched to exemestane then switched to Femara and then switch to tamoxifen and again switched back to Anastrozole Nov 2015.  Anastrozole toxicities: 1. Itching of the skin: Did not improve by stopping anastrozole  2. Bone density December 2015 revealed a T score of -0.9, normal bone density, this will need to be repeated 3. Fatigue and weight gain: Due to lack of exercise.  Stressed importance of physical exercise and eating healthy diet 4. Dizziness and neck pain: Much improved   5. Weight gain issues: I encouraged her to join weight loss programs.   Patient [facility] to [facility]. Renewedher prescription for anastrozole  Breast Cancer Surveillance: 1. Breast exam04/16/2019: Normal 2. Mammogram  10/21/2017 no evidence of malignancy breast density category B 12/15/2016: MRI pelvis: Perianal fistulous  Return to clinic in1 yearfor follow-up.

## 2018-09-27 ENCOUNTER — Other Ambulatory Visit: Payer: Self-pay | Admitting: Hematology and Oncology

## 2018-09-27 DIAGNOSIS — Z1231 Encounter for screening mammogram for malignant neoplasm of breast: Secondary | ICD-10-CM

## 2018-09-28 ENCOUNTER — Other Ambulatory Visit: Payer: Medicare Other

## 2018-09-29 NOTE — Progress Notes (Signed)
Patient Care Team: Velna Hatchet, MD as PCP - General (Internal Medicine) Jerline Pain, MD as PCP - Cardiology (Cardiology) Lavone Orn, MD as Attending Physician (Internal Medicine) Harlin Heys, MD as Referring Physician (Family Medicine)  DIAGNOSIS:    ICD-10-CM   1. Primary cancer of upper inner quadrant of left female breast ([place])  C50.212     SUMMARY OF ONCOLOGIC HISTORY: Oncology History  Primary cancer of upper inner quadrant of left female breast ([place])  07/31/2011 Surgery   left breast lumpectomy: IDC grade to 1.8 cm, no LV are, with DCIS, 3 sentinel nodes negative, grade 2, ER 100%, PR 0%, HER-2 negative, Ki-67 9%, T1 cN0 M0 stage IA, Oncotype score 18, 11% ROR low risk   10/14/2011 - 11/17/2011 Radiation Therapy   Adjuvant Radiation therapy   10/14/2011 -  Anti-estrogen oral therapy   Arimidex 1 mg daily developed a change switched to Femara and exemestane and then took tamoxifen and now back to Arimidex from 01/09/2014 to complete August 2018     CHIEF COMPLIANT: Follow-up of left breast cancer on anastrozole therapy  INTERVAL HISTORY: Katelyn Walsh is a 68 y.o. with above-mentioned history of left breast cancer treated with lumpectomy, radiation, and who is currently on anti-estrogen therapy with anastrozole. I last saw her over a year ago. Mammogram on 10/21/17 showed no evidence of malignancy bilaterally. Katelyn Walsh presents to the clinic today for annual follow-up.   REVIEW OF SYSTEMS:   Constitutional: Denies fevers, chills or abnormal weight loss Eyes: Denies blurriness of vision Ears, nose, mouth, throat, and face: Denies mucositis or sore throat Respiratory: Denies cough, dyspnea or wheezes Cardiovascular: Denies palpitation, chest discomfort Gastrointestinal: Denies nausea, heartburn or change in bowel habits Skin: Denies abnormal skin rashes Lymphatics: Denies new lymphadenopathy or easy bruising Neurological: Denies numbness, tingling or new weaknesses  Behavioral/Psych: Mood is stable, no new changes  Extremities: No lower extremity edema Breast: denies any pain or lumps or nodules in either breasts All other systems were reviewed with the patient and are negative.  I have reviewed the past medical history, past surgical history, social history and family history with the patient and they are unchanged from previous note.  ALLERGIES:  is allergic to dust mite extract and pollen extract.  MEDICATIONS:  Current Outpatient Medications  Medication Sig Dispense Refill  . albuterol (PROVENTIL HFA;VENTOLIN HFA) 108 (90 Base) MCG/ACT inhaler Inhale 2 puffs into the lungs every 6 (six) hours as needed for wheezing or shortness of breath.    . anastrozole (ARIMIDEX) 1 MG tablet Take 1 tablet (1 mg total) by mouth daily. 90 tablet 3  . aspirin EC 81 MG tablet Take 81 mg by mouth daily.    . benzonatate (TESSALON) 100 MG capsule Take 100-200 mg by mouth 3 (three) times daily as needed for cough.    . budesonide-formoterol (SYMBICORT) 80-4.5 MCG/ACT inhaler Inhale 2 puffs into the lungs 2 (two) times daily.    . cholecalciferol 5000 units TABS Take 1 tablet (5,000 Units total) by mouth daily.    . citalopram (CELEXA) 40 MG tablet Take 40 mg by mouth daily.   3  . furosemide (LASIX) 20 MG tablet Take 20 mg by mouth daily as needed.    . hydrOXYzine (ATARAX/VISTARIL) 25 MG tablet Take 1 tablet (25 mg total) by mouth 3 (three) times daily as needed. 30 tablet 6  . levocetirizine (XYZAL) 5 MG tablet Take 1 tablet (5 mg total) by mouth every evening.    [name] Kitchen  losartan (COZAAR) 100 MG tablet Take 100 mg by mouth every morning.     . metoprolol tartrate (LOPRESSOR) 100 MG tablet Take 1 tablet 2 hours prior to Cardiac CT (Patient not taking: Reported on 08/19/2018) 1 tablet 0  . montelukast (SINGULAIR) 10 MG tablet Take 10 mg by mouth at bedtime.    . Probiotic Product (ALIGN) 4 MG CAPS Take 1 capsule by mouth daily with breakfast.     . rOPINIRole (REQUIP) 1 MG  tablet Take 1-2 mg by mouth at bedtime.     . rosuvastatin (CRESTOR) 20 MG tablet Take 1 tablet (20 mg total) by mouth daily. (Patient not taking: Reported on 08/19/2018) 30 tablet 11  . topiramate (TOPAMAX) 100 MG tablet Take 100 mg by mouth 2 (two) times daily.     No current facility-administered medications for this visit.     PHYSICAL EXAMINATION: ECOG PERFORMANCE STATUS: 1 - Symptomatic but completely ambulatory  Vitals:   09/30/18 1207  BP: (!) 142/79  Pulse: 69  Resp: 17  Temp: 98.3 F (36.8 C)  SpO2: 99%   Filed Weights   09/30/18 1207  Weight: 239 lb 4 oz (108.5 kg)    GENERAL: alert, no distress and comfortable SKIN: skin color, texture, turgor are normal, no rashes or significant lesions EYES: normal, Conjunctiva are pink and non-injected, sclera clear OROPHARYNX: no exudate, no erythema and lips, buccal mucosa, and tongue normal  NECK: supple, thyroid normal size, non-tender, without nodularity LYMPH: no palpable lymphadenopathy in the cervical, axillary or inguinal LUNGS: clear to auscultation and percussion with normal breathing effort HEART: regular rate & rhythm and no murmurs and no lower extremity edema ABDOMEN: abdomen soft, non-tender and normal bowel sounds MUSCULOSKELETAL: no cyanosis of digits and no clubbing  NEURO: alert & oriented x 3 with fluent speech, no focal motor/sensory deficits EXTREMITIES: No lower extremity edema BREAST: No palpable masses or nodules in either right or left breasts. No palpable axillary supraclavicular or infraclavicular adenopathy no breast tenderness or nipple discharge. (exam performed in the presence of a chaperone)  LABORATORY DATA:  I have reviewed the data as listed CMP Latest Ref Rng & Units 07/20/2018 07/31/2014 01/09/2014  Glucose 65 - 99 mg/dL 108(H) 124 128  BUN 8 - 27 mg/dL 17 12.4 13.4  Creatinine 0.57 - 1.00 mg/dL 0.96 0.9 1.1  Sodium 134 - 144 mmol/L 140 142 142  Potassium 3.5 - 5.2 mmol/L 4.5 4.1 4.0   Chloride 96 - 106 mmol/L 105 - -  CO2 20 - 29 mmol/L 16(L) 23 20(L)  Calcium 8.7 - 10.3 mg/dL 9.5 8.9 9.0  Total Protein 6.4 - 8.3 g/dL - 6.8 6.9  Total Bilirubin 0.20 - 1.20 mg/dL - 0.66 0.72  Alkaline Phos 40 - 150 U/L - 74 78  AST 5 - 34 U/L - 22 23  ALT 0 - 55 U/L - 23 23    Lab Results  Component Value Date   WBC 5.0 07/31/2014   HGB 12.7 07/31/2014   HCT 38.9 07/31/2014   MCV 92.6 07/31/2014   PLT 193 07/31/2014   NEUTROABS 3.1 07/31/2014    ASSESSMENT & PLAN:  Primary cancer of upper inner quadrant of left female breast Left breast invasive ductal carcinoma, 1.8 cm, T1 C. N0 M0 stage IA ER percent, PR negative, HER-2 negative, grade 2, Ki-67 90%, 3 SLN negative, Oncotype DX recurrence score 18, percent risk of recurrence, status post radiation therapy, Arimidex 1 mg daily was started August 2013 later switched  to exemestane then switched to Femara and then switch to tamoxifen and again switched back to Anastrozole Nov 2015.  Anastrozole toxicities: 1. Itching of the skin: Did not improve by stopping anastrozole  2. Bone density December 2015 revealed a T score of -0.9, normal bone density, this will need to be repeated 3. Fatigue and weight gain: Due to lack of exercise.  Stressed importance of physical exercise and eating healthy diet 4. Dizziness and neck pain: Much improved   5. Weight gain issues: Patient has not been exercising and has been overeating and Katelyn Walsh claims that it is a stress that is making her eat.  We spent 15 minutes discussing her diet and set up some goals as well as exercise plan.   Katelyn Walsh completed 7 years of therapy and I have instructed her to stop the anastrozole. Patient loves [facility] to [facility].   Breast Cancer Surveillance: 1. Breast exam04/16/2019: Normal 2. Mammogram  10/21/2017 no evidence of malignancy breast density category B 12/15/2016: MRI pelvis: Perianal fistulous  Return to clinic in1 yearfor follow-up for surveillance.     No orders of the defined types were placed in this encounter.  The patient has a good understanding of the overall plan. Katelyn Walsh agrees with it. Katelyn Walsh will call with any problems that may develop before the next visit here.  Nicholas Lose, MD 09/30/2018  Julious Oka Dorshimer am acting as scribe for Dr. Nicholas Lose.  I have reviewed the above documentation for accuracy and completeness, and I agree with the above.

## 2018-09-30 ENCOUNTER — Other Ambulatory Visit: Payer: Self-pay

## 2018-09-30 ENCOUNTER — Inpatient Hospital Stay: Payer: Medicare Other | Attending: Hematology and Oncology | Admitting: Hematology and Oncology

## 2018-09-30 ENCOUNTER — Other Ambulatory Visit: Payer: Medicare Other

## 2018-09-30 DIAGNOSIS — I209 Angina pectoris, unspecified: Secondary | ICD-10-CM

## 2018-09-30 DIAGNOSIS — C50212 Malignant neoplasm of upper-inner quadrant of left female breast: Secondary | ICD-10-CM | POA: Diagnosis not present

## 2018-09-30 DIAGNOSIS — R635 Abnormal weight gain: Secondary | ICD-10-CM | POA: Insufficient documentation

## 2018-09-30 DIAGNOSIS — Z853 Personal history of malignant neoplasm of breast: Secondary | ICD-10-CM | POA: Diagnosis not present

## 2018-09-30 MED ORDER — AZELASTINE HCL 137 MCG/SPRAY NA SOLN: 1.0000 | Freq: Every day | NASAL | Status: DC

## 2018-10-03 ENCOUNTER — Telehealth: Payer: Self-pay | Admitting: Hematology and Oncology

## 2018-10-03 NOTE — Telephone Encounter (Signed)
I talk with patient regarding schedule  

## 2018-10-13 DIAGNOSIS — Z23 Encounter for immunization: Secondary | ICD-10-CM | POA: Diagnosis not present

## 2018-10-13 DIAGNOSIS — E1169 Type 2 diabetes mellitus with other specified complication: Secondary | ICD-10-CM | POA: Diagnosis not present

## 2018-10-14 ENCOUNTER — Ambulatory Visit: Payer: Medicare Other | Admitting: Dietician

## 2018-10-14 DIAGNOSIS — E7849 Other hyperlipidemia: Secondary | ICD-10-CM | POA: Diagnosis not present

## 2018-10-14 DIAGNOSIS — E1169 Type 2 diabetes mellitus with other specified complication: Secondary | ICD-10-CM | POA: Diagnosis not present

## 2018-10-14 DIAGNOSIS — I10 Essential (primary) hypertension: Secondary | ICD-10-CM | POA: Diagnosis not present

## 2018-11-11 ENCOUNTER — Ambulatory Visit: Payer: Medicare Other

## 2018-11-23 ENCOUNTER — Ambulatory Visit
Admission: RE | Admit: 2018-11-23 | Discharge: 2018-11-23 | Disposition: A | Discharge status: Home / Self Care | Payer: Medicare Other | Source: Ambulatory Visit | Attending: Hematology and Oncology | Admitting: Hematology and Oncology

## 2018-11-23 ENCOUNTER — Other Ambulatory Visit: Payer: Self-pay

## 2018-11-23 DIAGNOSIS — Z1231 Encounter for screening mammogram for malignant neoplasm of breast: Secondary | ICD-10-CM | POA: Diagnosis not present

## 2018-12-12 DIAGNOSIS — R05 Cough: Secondary | ICD-10-CM | POA: Diagnosis not present

## 2018-12-12 DIAGNOSIS — R519 Headache, unspecified: Secondary | ICD-10-CM | POA: Diagnosis not present

## 2018-12-12 DIAGNOSIS — B349 Viral infection, unspecified: Secondary | ICD-10-CM | POA: Diagnosis not present

## 2018-12-12 DIAGNOSIS — Z20818 Contact with and (suspected) exposure to other bacterial communicable diseases: Secondary | ICD-10-CM | POA: Diagnosis not present

## 2019-02-27 DIAGNOSIS — M25551 Pain in right hip: Secondary | ICD-10-CM | POA: Diagnosis not present

## 2019-02-28 ENCOUNTER — Encounter: Payer: Self-pay | Admitting: Family Medicine

## 2019-02-28 DIAGNOSIS — E1169 Type 2 diabetes mellitus with other specified complication: Secondary | ICD-10-CM | POA: Diagnosis not present

## 2019-03-06 DIAGNOSIS — R519 Headache, unspecified: Secondary | ICD-10-CM | POA: Diagnosis not present

## 2019-03-06 DIAGNOSIS — C50919 Malignant neoplasm of unspecified site of unspecified female breast: Secondary | ICD-10-CM | POA: Diagnosis not present

## 2019-03-06 DIAGNOSIS — F330 Major depressive disorder, recurrent, mild: Secondary | ICD-10-CM | POA: Diagnosis not present

## 2019-03-06 DIAGNOSIS — E559 Vitamin D deficiency, unspecified: Secondary | ICD-10-CM | POA: Diagnosis not present

## 2019-03-06 DIAGNOSIS — E785 Hyperlipidemia, unspecified: Secondary | ICD-10-CM | POA: Diagnosis not present

## 2019-03-06 DIAGNOSIS — M719 Bursopathy, unspecified: Secondary | ICD-10-CM | POA: Diagnosis not present

## 2019-03-06 DIAGNOSIS — G43909 Migraine, unspecified, not intractable, without status migrainosus: Secondary | ICD-10-CM | POA: Diagnosis not present

## 2019-03-06 DIAGNOSIS — Z0000 Encounter for general adult medical examination without abnormal findings: Secondary | ICD-10-CM | POA: Diagnosis not present

## 2019-03-06 DIAGNOSIS — G4733 Obstructive sleep apnea (adult) (pediatric): Secondary | ICD-10-CM | POA: Diagnosis not present

## 2019-03-06 DIAGNOSIS — I10 Essential (primary) hypertension: Secondary | ICD-10-CM | POA: Diagnosis not present

## 2019-03-06 DIAGNOSIS — F419 Anxiety disorder, unspecified: Secondary | ICD-10-CM | POA: Diagnosis not present

## 2019-03-27 DIAGNOSIS — Z20822 Contact with and (suspected) exposure to covid-19: Secondary | ICD-10-CM | POA: Diagnosis not present

## 2019-03-28 ENCOUNTER — Other Ambulatory Visit: Payer: Self-pay | Admitting: Pulmonary Disease

## 2019-03-28 DIAGNOSIS — G4734 Idiopathic sleep related nonobstructive alveolar hypoventilation: Secondary | ICD-10-CM

## 2019-04-10 DIAGNOSIS — M7061 Trochanteric bursitis, right hip: Secondary | ICD-10-CM | POA: Diagnosis not present

## 2019-04-24 ENCOUNTER — Ambulatory Visit: Payer: Medicare Other

## 2019-04-24 ENCOUNTER — Ambulatory Visit: Payer: Medicare Other | Attending: Internal Medicine

## 2019-04-24 DIAGNOSIS — Z23 Encounter for immunization: Secondary | ICD-10-CM

## 2019-04-24 NOTE — Progress Notes (Signed)
   Covid-19 Vaccination Clinic  Name:  Demeka Frazer    MRN: [id number] DOB: [birth year]  04/24/2019  Ms. Bartkiewicz was observed post Covid-19 immunization for 15 minutes without incidence. She was provided with Vaccine Information Sheet and instruction to access the V-Safe system.   Ms. Towe was instructed to call 911 with any severe reactions post vaccine: [name] Kitchen Difficulty breathing  . Swelling of your face and throat  . A fast heartbeat  . A bad rash all over your body  . Dizziness and weakness    Immunizations Administered    Name Date Dose VIS Date Route   Pfizer COVID-19 Vaccine 04/24/2019 12:43 PM 0.3 mL 02/03/2019 Intramuscular   Manufacturer: [place]   Lot: [id number]   [place]: [id number]

## 2019-04-25 ENCOUNTER — Encounter (HOSPITAL_COMMUNITY): Payer: Self-pay

## 2019-04-25 ENCOUNTER — Emergency Department (HOSPITAL_COMMUNITY): Payer: Medicare Other

## 2019-04-25 ENCOUNTER — Inpatient Hospital Stay (HOSPITAL_COMMUNITY)
Admission: EM | Admit: 2019-04-25 | Discharge: 2019-04-27 | DRG: 291 | Disposition: A | Discharge status: Home / Self Care | Payer: Medicare Other | Attending: Internal Medicine | Admitting: Internal Medicine

## 2019-04-25 ENCOUNTER — Other Ambulatory Visit: Payer: Self-pay

## 2019-04-25 DIAGNOSIS — Z7951 Long term (current) use of inhaled steroids: Secondary | ICD-10-CM

## 2019-04-25 DIAGNOSIS — Z853 Personal history of malignant neoplasm of breast: Secondary | ICD-10-CM

## 2019-04-25 DIAGNOSIS — R072 Precordial pain: Secondary | ICD-10-CM | POA: Diagnosis not present

## 2019-04-25 DIAGNOSIS — J301 Allergic rhinitis due to pollen: Secondary | ICD-10-CM | POA: Diagnosis present

## 2019-04-25 DIAGNOSIS — Z923 Personal history of irradiation: Secondary | ICD-10-CM

## 2019-04-25 DIAGNOSIS — G4733 Obstructive sleep apnea (adult) (pediatric): Secondary | ICD-10-CM | POA: Diagnosis not present

## 2019-04-25 DIAGNOSIS — Z20822 Contact with and (suspected) exposure to covid-19: Secondary | ICD-10-CM | POA: Diagnosis not present

## 2019-04-25 DIAGNOSIS — Z8249 Family history of ischemic heart disease and other diseases of the circulatory system: Secondary | ICD-10-CM

## 2019-04-25 DIAGNOSIS — R0602 Shortness of breath: Secondary | ICD-10-CM | POA: Diagnosis not present

## 2019-04-25 DIAGNOSIS — R600 Localized edema: Secondary | ICD-10-CM | POA: Diagnosis not present

## 2019-04-25 DIAGNOSIS — R0600 Dyspnea, unspecified: Secondary | ICD-10-CM | POA: Diagnosis not present

## 2019-04-25 DIAGNOSIS — E785 Hyperlipidemia, unspecified: Secondary | ICD-10-CM | POA: Diagnosis not present

## 2019-04-25 DIAGNOSIS — I5031 Acute diastolic (congestive) heart failure: Secondary | ICD-10-CM | POA: Diagnosis not present

## 2019-04-25 DIAGNOSIS — T50Z95A Adverse effect of other vaccines and biological substances, initial encounter: Secondary | ICD-10-CM | POA: Diagnosis present

## 2019-04-25 DIAGNOSIS — Z79899 Other long term (current) drug therapy: Secondary | ICD-10-CM

## 2019-04-25 DIAGNOSIS — D519 Vitamin B12 deficiency anemia, unspecified: Secondary | ICD-10-CM | POA: Diagnosis present

## 2019-04-25 DIAGNOSIS — G2581 Restless legs syndrome: Secondary | ICD-10-CM | POA: Diagnosis present

## 2019-04-25 DIAGNOSIS — I110 Hypertensive heart disease with heart failure: Principal | ICD-10-CM | POA: Diagnosis present

## 2019-04-25 DIAGNOSIS — M7989 Other specified soft tissue disorders: Secondary | ICD-10-CM

## 2019-04-25 DIAGNOSIS — R509 Fever, unspecified: Secondary | ICD-10-CM | POA: Diagnosis present

## 2019-04-25 DIAGNOSIS — F329 Major depressive disorder, single episode, unspecified: Secondary | ICD-10-CM | POA: Diagnosis not present

## 2019-04-25 DIAGNOSIS — M25551 Pain in right hip: Secondary | ICD-10-CM | POA: Diagnosis present

## 2019-04-25 DIAGNOSIS — J9601 Acute respiratory failure with hypoxia: Secondary | ICD-10-CM | POA: Diagnosis present

## 2019-04-25 DIAGNOSIS — Z20818 Contact with and (suspected) exposure to other bacterial communicable diseases: Secondary | ICD-10-CM | POA: Diagnosis not present

## 2019-04-25 DIAGNOSIS — R0682 Tachypnea, not elsewhere classified: Secondary | ICD-10-CM | POA: Diagnosis not present

## 2019-04-25 DIAGNOSIS — Z7289 Other problems related to lifestyle: Secondary | ICD-10-CM

## 2019-04-25 DIAGNOSIS — Z7982 Long term (current) use of aspirin: Secondary | ICD-10-CM

## 2019-04-25 DIAGNOSIS — C50212 Malignant neoplasm of upper-inner quadrant of left female breast: Secondary | ICD-10-CM | POA: Diagnosis present

## 2019-04-25 LAB — CBC
HCT: 39.4 % (ref 36.0–46.0)
Hemoglobin: 12.4 g/dL (ref 12.0–15.0)
MCH: 30.3 pg (ref 26.0–34.0)
MCHC: 31.5 g/dL (ref 30.0–36.0)
MCV: 96.3 fL (ref 80.0–100.0)
Platelets: 172 10*3/uL (ref 150–400)
RBC: 4.09 MIL/uL (ref 3.87–5.11)
RDW: 13.1 % (ref 11.5–15.5)
WBC: 6.3 10*3/uL (ref 4.0–10.5)
nRBC: 0 % (ref 0.0–0.2)

## 2019-04-25 LAB — CBC AND DIFFERENTIAL
HCT: 41 (ref 36–46)
Hemoglobin: 12.6 (ref 12.0–16.0)
Platelets: 177 (ref 150–399)
WBC: 6.6

## 2019-04-25 LAB — BASIC METABOLIC PANEL
Anion gap: 11 (ref 5–15)
BUN: 12 (ref 4–21)
BUN: 13 mg/dL (ref 8–23)
CO2: 20 mmol/L — ABNORMAL LOW (ref 22–32)
CO2: 21 (ref 13–22)
Calcium: 9 mg/dL (ref 8.9–10.3)
Chloride: 107 mmol/L (ref 98–111)
Chloride: 108 (ref 99–108)
Creatinine, Ser: 0.94 mg/dL (ref 0.44–1.00)
Creatinine: 0.9 (ref 0.5–1.1)
GFR calc Af Amer: >60 mL/min (ref >60)
GFR calc non Af Amer: >60 mL/min (ref >60)
Glucose, Bld: 104 mg/dL — ABNORMAL HIGH (ref 70–99)
Glucose: 107
Potassium: 4 (ref 3.4–5.3)
Potassium: 4 mmol/L (ref 3.5–5.1)
Sodium: 138 mmol/L (ref 135–145)
Sodium: 139 (ref 137–147)

## 2019-04-25 LAB — RESPIRATORY PANEL BY RT PCR (FLU A&B, COVID)
Influenza A by PCR: NEGATIVE
Influenza B by PCR: NEGATIVE
SARS Coronavirus 2 by RT PCR: NEGATIVE

## 2019-04-25 LAB — TROPONIN I (HIGH SENSITIVITY)
Troponin I (High Sensitivity): 4 ng/L (ref <18)
Troponin I (High Sensitivity): 4 ng/L (ref <18)

## 2019-04-25 LAB — COMPREHENSIVE METABOLIC PANEL: Calcium: 8.6 — AB (ref 8.7–10.7)

## 2019-04-25 LAB — BRAIN NATRIURETIC PEPTIDE: B Natriuretic Peptide: 78.5 pg/mL (ref 0.0–100.0)

## 2019-04-25 LAB — D-DIMER, QUANTITATIVE: D-Dimer, Quant: 0.31 ug/mL-FEU (ref 0.00–0.50)

## 2019-04-25 MED ORDER — IOHEXOL 350 MG/ML SOLN
80.0000 mL | Freq: Once | INTRAVENOUS | Status: AC | PRN
  Administered 2019-04-25: 80 mL via INTRAVENOUS

## 2019-04-25 MED ORDER — FUROSEMIDE 10 MG/ML IJ SOLN
40.0000 mg | Freq: Once | INTRAMUSCULAR | Status: AC
  Administered 2019-04-25: 40 mg via INTRAVENOUS
  Filled 2019-04-25: qty 4

## 2019-04-25 MED ORDER — ACETAMINOPHEN 500 MG PO TABS
1000.0000 mg | ORAL_TABLET | Freq: Once | ORAL | Status: AC
  Administered 2019-04-25: 1000 mg via ORAL
  Filled 2019-04-25: qty 2

## 2019-04-25 NOTE — ED Provider Notes (Signed)
Emergency Department Provider Note   I have reviewed the triage vital signs and the nursing notes.   HISTORY  Chief Complaint Leg Swelling and Shortness of Breath   HPI Katelyn Walsh is a 69 y.o. female with past medical history reviewed below presents emergency department for evaluation of lower leg swelling worse on the right with shortness of breath.  Symptoms have been worsening over the past several days.  Patient traveled to [facility] biplane which is only a 1.5-hour flight.  She has no prior history of DVT/PE.  She noticed swelling in her legs getting worse especially in the right leg and took a 20 mg Lasix which she is prescribed as needed.  She took 1 dose yesterday with no repeat dose today.  Past Medical History:  Diagnosis Date  . Acute mastitis of left breast 02/09/2012  . Asthma    allergies  . Breast abscess 01/12/2012  . Breast cancer ([place])    07-30-2011-- stage1 dcis  aT1c,pN0, pMX----  s/p left breast partial mastectomy w/ sln bx  and radiation therapy ,  complete 10-28-2011  . Depression   . Hot flashes   . Hyperlipidemia   . Hypertension   . Mastitis chronic 04/15/2012  . Migraines   . Open breast wound    non-healing left breast surgical wound --- no draining---  dressing change at wound care center  &  hyperbaric oxyogen therapy  . OSA (obstructive sleep apnea)    NOT USED CPAP SINCE 2012  (PT STATES SLEEP STUDY WAS MILD OSA)  . Primary cancer of upper inner quadrant of left female breast ([place]) 06/29/2011   ER 100%  PR 0%  Her 2 Neu 1.51  Ki67 6%   . Rash and nonspecific skin eruption 08/13/2011  . S/P radiation therapy 09/15/11 - 10/28/11   Left Breast  . Seasonal allergies   . Vitamin B12 deficiency anemia 07/31/2014    Patient Active Problem List   Diagnosis Date Noted  . Acute respiratory failure with hypoxia ([place]) 04/25/2019  . Fever 04/25/2019  . Vitamin B12 deficiency anemia 07/31/2014  . Visit for wound check 11/25/2012  . Mastitis  chronic 04/15/2012  . Chronic mastitis 03/28/2012  . Acute mastitis of left breast 02/09/2012  . Breast abscess 01/12/2012  . Rash and nonspecific skin eruption 08/13/2011  . Mastitis in female 08/13/2011  . Post-operative state 08/07/2011  . Primary cancer of upper inner quadrant of left female breast ([place]) 06/29/2011    Past Surgical History:  Procedure Laterality Date  . BREAST BIOPSY Left 07/07/2012   Procedure: Left Breast Debridement;  Surgeon: Marcello Moores A. Cornett, MD;  Location: [facility];  Service: General;  Laterality: Left;  Left Breast Debridement  . BREAST LUMPECTOMY    . COLONOSCOPY     polyps removed in past  . LUMBAR MICRODISCECTOMY  2005   L4 -- L5  . MASTECTOMY PARTIAL / LUMPECTOMY W/ AXILLARY LYMPHADENECTOMY  07-30-2011   Left Breast : Invasive Ductal Carcinoma: 0/3  Nodes Negative: ER 100%, PR 0%, Her 2Neu Negative  . TONSILLECTOMY  AS CHILD    Allergies Dust mite extract and Pollen extract  Family History  Problem Relation Age of Onset  . Cancer Father        brain  . Stroke Father   . Hypertension Mother     Social History Social History   Tobacco Use  . Smoking status: Never Smoker  . Smokeless tobacco: Never Used  Substance Use Topics  .  Alcohol use: Yes    Comment: occasional  wine  . Drug use: No    Review of Systems  Constitutional: No fever/chills noted at home.  Eyes: No visual changes. ENT: No sore throat. Cardiovascular: Positive chest pain. Respiratory: Positive shortness of breath. Gastrointestinal: No abdominal pain.  No nausea, no vomiting.  No diarrhea.  No constipation. Genitourinary: Negative for dysuria. Musculoskeletal: Negative for back pain. Skin: Negative for rash. Neurological: Negative for headaches, focal weakness or numbness.  10-point ROS otherwise negative.  ____________________________________________   PHYSICAL EXAM:  VITAL SIGNS: ED Triage Vitals  Enc Vitals Group     BP 04/25/19 1657 (!) 160/77      Pulse Rate 04/25/19 1657 (!) 101     Resp 04/25/19 1657 18     Temp 04/25/19 1657 (!) 101.3 F (38.5 C)     Temp Source 04/25/19 1657 Oral     SpO2 04/25/19 1657 100 %     Weight 04/25/19 1700 (!) 320 lb (145.2 kg)     Height 04/25/19 1700 5' 5.25" (1.657 m)   Constitutional: Alert and oriented. Well appearing and in no acute distress. Eyes: Conjunctivae are normal.  Head: Atraumatic. Nose: No congestion/rhinnorhea. Mouth/Throat: Mucous membranes are moist.  Neck: No stridor.  Cardiovascular:Tachycardia. Good peripheral circulation. Grossly normal heart sounds.   Respiratory: Increased respiratory effort.  No retractions. Lungs with crackles at the bases.  Gastrointestinal: Soft and nontender. No distention.  Musculoskeletal: 3+ pitting edema worse on the right.  Neurologic:  Normal speech and language. No gross focal neurologic deficits are appreciated.  Skin:  Skin is warm, dry and intact. No rash noted.  ____________________________________________   LABS (all labs ordered are listed, but only abnormal results are displayed)  Labs Reviewed  BASIC METABOLIC PANEL - Abnormal; Notable for the following components:      Result Value   CO2 20 (*)    Glucose, Bld 104 (*)    All other components within normal limits  BASIC METABOLIC PANEL - Abnormal; Notable for the following components:   Potassium 3.2 (*)    Glucose, Bld 123 (*)    Creatinine, Ser 1.02 (*)    GFR calc non Af Amer 56 (*)    All other components within normal limits  CULTURE, BLOOD (ROUTINE X 2)  CULTURE, BLOOD (ROUTINE X 2)  RESPIRATORY PANEL BY RT PCR (FLU A&B, COVID)  CBC  BRAIN NATRIURETIC PEPTIDE  D-DIMER, QUANTITATIVE (NOT AT [facility])  HIV ANTIBODY (ROUTINE TESTING W REFLEX)  CBC  URINALYSIS, ROUTINE W REFLEX MICROSCOPIC  TROPONIN I (HIGH SENSITIVITY)  TROPONIN I (HIGH SENSITIVITY)   ____________________________________________  EKG   EKG Interpretation  Date/Time:  Tuesday April 25 2019  16:52:07 EST Ventricular Rate:  104 PR Interval:  162 QRS Duration: 80 QT Interval:  344 QTC Calculation: 452 R Axis:   45 Text Interpretation: Sinus tachycardia Otherwise normal ECG Confirmed by Addison Lank [phone]) on 04/26/2019 9:35:13 AM       ____________________________________________  RADIOLOGY  DG Chest 2 View  Result Date: 04/25/2019 CLINICAL DATA:  Shortness of breath. EXAM: CHEST - 2 VIEW COMPARISON:  December 12, 2012. FINDINGS: The heart size and mediastinal contours are within normal limits. Both lungs are clear. No pneumothorax or pleural effusion is noted. The visualized skeletal structures are unremarkable. IMPRESSION: No active cardiopulmonary disease. Electronically Signed   By: Marijo Conception M.D.   On: 04/25/2019 17:24   CT Angio Chest PE W and/or Wo Contrast  Result Date:  04/25/2019 CLINICAL DATA:  Leg swelling, shortness of breath EXAM: CT ANGIOGRAPHY CHEST WITH CONTRAST TECHNIQUE: Multidetector CT imaging of the chest was performed using the standard protocol during bolus administration of intravenous contrast. Multiplanar CT image reconstructions and MIPs were obtained to evaluate the vascular anatomy. CONTRAST:  47m OMNIPAQUE IOHEXOL 350 MG/ML SOLN COMPARISON:  Chest x-ray from earlier in the same day, CT from 07/22/2018. FINDINGS: Cardiovascular: Thoracic aorta demonstrates atherosclerotic calcifications without aneurysmal dilatation or dissection. Mild coronary calcifications are seen. No sizable cardiac enlargement is noted. The pulmonary artery is well visualized within normal branching pattern. No filling defects to suggest pulmonary emboli are identified. Mediastinum/Nodes: Thoracic inlet is within normal limits. The esophagus as visualized is unremarkable. No sizable hilar or mediastinal adenopathy is noted. Lungs/Pleura: Scattered calcified granulomas are noted. Mild emphysematous changes are seen. No focal infiltrate or sizable effusion is noted. No sizable  parenchymal nodule is noted. Upper Abdomen: Stable cyst is noted within the liver. Dependent densities are noted within the gallbladder consistent with cholelithiasis. No other focal abnormality in the upper abdomen is seen. Musculoskeletal: No chest wall abnormality. No acute or significant osseous findings. Review of the MIP images confirms the above findings. IMPRESSION: No evidence of pulmonary emboli. Changes of prior granulomatous disease. Cholelithiasis. Stable hepatic cyst Aortic Atherosclerosis (ICD10-I70.0) and Emphysema (ICD10-J43.9). Electronically Signed   By: MInez CatalinaM.D.   On: 04/25/2019 22:23   VAS [place]OWER EXTREMITY VENOUS (DVT)  Result Date: 04/26/2019  Lower Venous DVTStudy Indications: Edema.  Risk Factors[place] Anticoagulation: Lovenox. Limitations: Body habitus and poor ultrasound/tissue interface. Comparison Study: No prior exam. Performing Technologist: KBaldwin CrownARDMS, RVT  Examination Guidelines: A complete evaluation includes B-mode imaging, spectral Doppler, color Doppler, and power Doppler as needed of all accessible portions of each vessel. Bilateral testing is considered an integral part of a complete examination. Limited examinations for reoccurring indications may be performed as noted. The reflux portion of the exam is performed with the patient in reverse Trendelenburg.  +---------+---------------+---------+-----------+----------+------------------+ RIGHT    CompressibilityPhasicitySpontaneityPropertiesThrombus Aging     +---------+---------------+---------+-----------+----------+------------------+ CFV      Full           Yes      Yes                                     +---------+---------------+---------+-----------+----------+------------------+ SFJ      Full                                                            +---------+---------------+---------+-----------+----------+------------------+ FV  Prox  Full                                                            +---------+---------------+---------+-----------+----------+------------------+ FV Mid   Full                                                            +---------+---------------+---------+-----------+----------+------------------+  FV DistalFull                                                            +---------+---------------+---------+-----------+----------+------------------+ PFV      Full                                                            +---------+---------------+---------+-----------+----------+------------------+ POP      Full                                                            +---------+---------------+---------+-----------+----------+------------------+ PTV      Full                                         visualized with                                                          color              +---------+---------------+---------+-----------+----------+------------------+ PERO     Full                                         visualized with                                                          color              +---------+---------------+---------+-----------+----------+------------------+   +---------+---------------+---------+-----------+----------+------------------+ LEFT     CompressibilityPhasicitySpontaneityPropertiesThrombus Aging     +---------+---------------+---------+-----------+----------+------------------+ CFV      Full           Yes      Yes                                     +---------+---------------+---------+-----------+----------+------------------+ SFJ      Full                                                            +---------+---------------+---------+-----------+----------+------------------+ FV Prox  Full                                                             +---------+---------------+---------+-----------+----------+------------------+  FV Mid   Full                                                            +---------+---------------+---------+-----------+----------+------------------+ FV DistalFull                                                            +---------+---------------+---------+-----------+----------+------------------+ PFV      Full                                                            +---------+---------------+---------+-----------+----------+------------------+ POP      Full                                                            +---------+---------------+---------+-----------+----------+------------------+ PTV      Full                                         visualized with                                                          color              +---------+---------------+---------+-----------+----------+------------------+ PERO     Full                                         visualized with                                                          color              +---------+---------------+---------+-----------+----------+------------------+    Summary: BILATERAL: - No evidence of deep vein thrombosis seen in the lower extremities, bilaterally.  RIGHT: - No cystic structure found in the popliteal fossa.  LEFT: - No cystic structure found in the popliteal fossa.  *See table(s) above for measurements and observations.    Preliminary     ____________________________________________   PROCEDURES  Procedure(s) performed:   Procedures  None ____________________________________________   INITIAL IMPRESSION / ASSESSMENT AND PLAN / ED COURSE  Pertinent labs & imaging results that were available during my care of the patient were reviewed by  me and considered in my medical decision making (see chart for details).   Patient presents to the ED with CP and SOB. Patient  with h/o cancer and recent flight. Labs and imaging are reassuring. CTA negative for PE. Patient with continued SOB. Plan for diuresis and obs admit for DVT US and possible ECHO in the AM.   Discussed patient's case with [facility] to request admission. Patient and family (if present) updated with plan. Care transferred to [facility] service.  I reviewed all nursing notes, vitals, pertinent old records, EKGs, labs, imaging (as available).    ____________________________________________  FINAL CLINICAL IMPRESSION(S) / ED DIAGNOSES  Final diagnoses:  Precordial chest pain  SOB (shortness of breath)  Leg swelling     MEDICATIONS GIVEN DURING THIS VISIT:  Medications  rOPINIRole (REQUIP) tablet 2 mg (2 mg Oral Given 04/26/19 0146)  rosuvastatin (CRESTOR) tablet 20 mg (20 mg Oral Given 04/26/19 0146)  montelukast (SINGULAIR) tablet 10 mg (10 mg Oral Given 04/26/19 0146)  aspirin EC tablet 81 mg (81 mg Oral Given 04/26/19 1003)  rosuvastatin (CRESTOR) tablet 20 mg (20 mg Oral Given 04/26/19 0250)  citalopram (CELEXA) tablet 40 mg (40 mg Oral Given 04/26/19 1003)  topiramate (TOPAMAX) tablet 100 mg (100 mg Oral Given 04/26/19 1003)  albuterol (PROVENTIL) (2.5 MG/3ML) 0.083% nebulizer solution 2.5 mg (has no administration in time range)  loratadine (CLARITIN) tablet 10 mg (10 mg Oral Given 04/26/19 0251)  acetaminophen (TYLENOL) tablet 650 mg (650 mg Oral Given 04/26/19 1015)    Or  acetaminophen (TYLENOL) suppository 650 mg ( Rectal See Alternative 04/26/19 1015)  ondansetron (ZOFRAN) tablet 4 mg (has no administration in time range)    Or  ondansetron (ZOFRAN) injection 4 mg (has no administration in time range)  enoxaparin (LOVENOX) injection 40 mg (40 mg Subcutaneous Given 04/26/19 1003)  hydrALAZINE (APRESOLINE) injection 10 mg (has no administration in time range)  perflutren lipid microspheres (DEFINITY) IV suspension (4 mLs Intravenous Given 04/26/19 0918)  acetaminophen (TYLENOL) tablet 1,000 mg (1,000 mg Oral  Given 04/25/19 2004)  iohexol (OMNIPAQUE) 350 MG/ML injection 80 mL (80 mLs Intravenous Contrast Given 04/25/19 2202)  furosemide (LASIX) injection 40 mg (40 mg Intravenous Given 04/25/19 2251)  traMADol (ULTRAM) tablet 50 mg (50 mg Oral Given 04/26/19 0615)  potassium chloride SA (KLOR-CON) CR tablet 40 mEq (40 mEq Oral Given 04/26/19 1002)    Note:  This document was prepared using Dragon voice recognition software and may include unintentional dictation errors.  Nanda Quinton, MD, [facility]    Doralee Kocak, [facility], MD 04/26/19 1314

## 2019-04-25 NOTE — ED Triage Notes (Signed)
Pt arrives POV for eval of general malaise, shortness of breath and leg swelling. Pt reports she noted the leg swelling while visiting [place] last week, and went to PCP today for eval. Of note, pt received her first covid vaccine yesterday. Pt febrile to 101 in triage, denies known covid contacts.

## 2019-04-26 ENCOUNTER — Other Ambulatory Visit: Payer: Self-pay

## 2019-04-26 ENCOUNTER — Observation Stay (HOSPITAL_COMMUNITY): Payer: Medicare Other

## 2019-04-26 ENCOUNTER — Ambulatory Visit (HOSPITAL_COMMUNITY): Payer: Medicare Other

## 2019-04-26 ENCOUNTER — Encounter (HOSPITAL_COMMUNITY): Payer: Self-pay | Admitting: Internal Medicine

## 2019-04-26 DIAGNOSIS — I5031 Acute diastolic (congestive) heart failure: Secondary | ICD-10-CM | POA: Diagnosis present

## 2019-04-26 DIAGNOSIS — R509 Fever, unspecified: Secondary | ICD-10-CM | POA: Diagnosis not present

## 2019-04-26 DIAGNOSIS — J9601 Acute respiratory failure with hypoxia: Secondary | ICD-10-CM | POA: Diagnosis not present

## 2019-04-26 DIAGNOSIS — D519 Vitamin B12 deficiency anemia, unspecified: Secondary | ICD-10-CM | POA: Diagnosis present

## 2019-04-26 DIAGNOSIS — G2581 Restless legs syndrome: Secondary | ICD-10-CM | POA: Diagnosis present

## 2019-04-26 DIAGNOSIS — R609 Edema, unspecified: Secondary | ICD-10-CM | POA: Diagnosis not present

## 2019-04-26 DIAGNOSIS — Z20822 Contact with and (suspected) exposure to covid-19: Secondary | ICD-10-CM | POA: Diagnosis not present

## 2019-04-26 DIAGNOSIS — T50Z95A Adverse effect of other vaccines and biological substances, initial encounter: Secondary | ICD-10-CM | POA: Diagnosis present

## 2019-04-26 DIAGNOSIS — Z923 Personal history of irradiation: Secondary | ICD-10-CM | POA: Diagnosis not present

## 2019-04-26 DIAGNOSIS — I110 Hypertensive heart disease with heart failure: Secondary | ICD-10-CM | POA: Diagnosis present

## 2019-04-26 DIAGNOSIS — M25551 Pain in right hip: Secondary | ICD-10-CM | POA: Diagnosis present

## 2019-04-26 DIAGNOSIS — G4733 Obstructive sleep apnea (adult) (pediatric): Secondary | ICD-10-CM | POA: Diagnosis present

## 2019-04-26 DIAGNOSIS — Z7951 Long term (current) use of inhaled steroids: Secondary | ICD-10-CM | POA: Diagnosis not present

## 2019-04-26 DIAGNOSIS — Z7982 Long term (current) use of aspirin: Secondary | ICD-10-CM | POA: Diagnosis not present

## 2019-04-26 DIAGNOSIS — R0602 Shortness of breath: Secondary | ICD-10-CM | POA: Diagnosis not present

## 2019-04-26 DIAGNOSIS — F329 Major depressive disorder, single episode, unspecified: Secondary | ICD-10-CM | POA: Diagnosis present

## 2019-04-26 DIAGNOSIS — Z853 Personal history of malignant neoplasm of breast: Secondary | ICD-10-CM | POA: Diagnosis not present

## 2019-04-26 DIAGNOSIS — Z79899 Other long term (current) drug therapy: Secondary | ICD-10-CM | POA: Diagnosis not present

## 2019-04-26 DIAGNOSIS — R072 Precordial pain: Secondary | ICD-10-CM | POA: Diagnosis not present

## 2019-04-26 DIAGNOSIS — Z7289 Other problems related to lifestyle: Secondary | ICD-10-CM | POA: Diagnosis not present

## 2019-04-26 DIAGNOSIS — M7989 Other specified soft tissue disorders: Secondary | ICD-10-CM | POA: Diagnosis not present

## 2019-04-26 DIAGNOSIS — Z8249 Family history of ischemic heart disease and other diseases of the circulatory system: Secondary | ICD-10-CM | POA: Diagnosis not present

## 2019-04-26 DIAGNOSIS — E785 Hyperlipidemia, unspecified: Secondary | ICD-10-CM | POA: Diagnosis present

## 2019-04-26 DIAGNOSIS — J301 Allergic rhinitis due to pollen: Secondary | ICD-10-CM | POA: Diagnosis present

## 2019-04-26 LAB — ECHOCARDIOGRAM COMPLETE
Height: 65.25 in
Weight: 3692.8 oz

## 2019-04-26 LAB — CBC
HCT: 40.5 % (ref 36.0–46.0)
Hemoglobin: 12.6 g/dL (ref 12.0–15.0)
MCH: 29.9 pg (ref 26.0–34.0)
MCHC: 31.1 g/dL (ref 30.0–36.0)
MCV: 96.2 fL (ref 80.0–100.0)
Platelets: 171 10*3/uL (ref 150–400)
RBC: 4.21 MIL/uL (ref 3.87–5.11)
RDW: 13.1 % (ref 11.5–15.5)
WBC: 6 10*3/uL (ref 4.0–10.5)
nRBC: 0 % (ref 0.0–0.2)

## 2019-04-26 LAB — BASIC METABOLIC PANEL
Anion gap: 12 (ref 5–15)
BUN: 12 mg/dL (ref 8–23)
CO2: 23 mmol/L (ref 22–32)
Calcium: 9 mg/dL (ref 8.9–10.3)
Chloride: 103 mmol/L (ref 98–111)
Creatinine, Ser: 1.02 mg/dL — ABNORMAL HIGH (ref 0.44–1.00)
GFR calc Af Amer: >60 mL/min (ref >60)
GFR calc non Af Amer: 56 mL/min — ABNORMAL LOW (ref >60)
Glucose, Bld: 123 mg/dL — ABNORMAL HIGH (ref 70–99)
Potassium: 3.2 mmol/L — ABNORMAL LOW (ref 3.5–5.1)
Sodium: 138 mmol/L (ref 135–145)

## 2019-04-26 LAB — HIV ANTIBODY (ROUTINE TESTING W REFLEX): HIV Screen 4th Generation wRfx: NONREACTIVE

## 2019-04-26 MED ORDER — ENOXAPARIN SODIUM 40 MG/0.4ML ~~LOC~~ SOLN
40.0000 mg | Freq: Every day | SUBCUTANEOUS | Status: DC
  Administered 2019-04-26: 40 mg via SUBCUTANEOUS
  Filled 2019-04-26: qty 0.4

## 2019-04-26 MED ORDER — ONDANSETRON HCL 4 MG PO TABS: 4.0000 mg | ORAL_TABLET | Freq: Four times a day (QID) | ORAL | Status: DC | PRN

## 2019-04-26 MED ORDER — ALBUTEROL SULFATE (2.5 MG/3ML) 0.083% IN NEBU: 2.5000 mg | INHALATION_SOLUTION | Freq: Four times a day (QID) | RESPIRATORY_TRACT | Status: DC | PRN

## 2019-04-26 MED ORDER — MONTELUKAST SODIUM 10 MG PO TABS
10.0000 mg | ORAL_TABLET | Freq: Every day | ORAL | Status: DC
  Administered 2019-04-26 (×2): 10 mg via ORAL
  Filled 2019-04-26 (×2): qty 1

## 2019-04-26 MED ORDER — LORATADINE 10 MG PO TABS
10.0000 mg | ORAL_TABLET | Freq: Every evening | ORAL | Status: DC
  Administered 2019-04-26 (×2): 10 mg via ORAL
  Filled 2019-04-26 (×2): qty 1

## 2019-04-26 MED ORDER — PERFLUTREN LIPID MICROSPHERE
1.0000 mL | INTRAVENOUS | Status: AC | PRN
  Administered 2019-04-26: 4 mL via INTRAVENOUS
  Filled 2019-04-26: qty 10

## 2019-04-26 MED ORDER — TOPIRAMATE 100 MG PO TABS
100.0000 mg | ORAL_TABLET | Freq: Two times a day (BID) | ORAL | Status: DC
  Administered 2019-04-26 (×3): 100 mg via ORAL
  Filled 2019-04-26 (×2): qty 1
  Filled 2019-04-26: qty 4

## 2019-04-26 MED ORDER — ASPIRIN EC 81 MG PO TBEC
81.0000 mg | DELAYED_RELEASE_TABLET | Freq: Every day | ORAL | Status: DC
  Administered 2019-04-26: 81 mg via ORAL
  Filled 2019-04-26: qty 1

## 2019-04-26 MED ORDER — ROSUVASTATIN CALCIUM 20 MG PO TABS
20.0000 mg | ORAL_TABLET | Freq: Every day | ORAL | Status: DC
  Administered 2019-04-26 (×2): 20 mg via ORAL
  Filled 2019-04-26 (×2): qty 1

## 2019-04-26 MED ORDER — LOSARTAN POTASSIUM 50 MG PO TABS: 100.0000 mg | ORAL_TABLET | Freq: Every morning | ORAL | Status: DC

## 2019-04-26 MED ORDER — ONDANSETRON HCL 4 MG/2ML IJ SOLN: 4.0000 mg | Freq: Four times a day (QID) | INTRAMUSCULAR | Status: DC | PRN

## 2019-04-26 MED ORDER — FUROSEMIDE 10 MG/ML IJ SOLN: 40.0000 mg | Freq: Two times a day (BID) | INTRAMUSCULAR | Status: DC

## 2019-04-26 MED ORDER — POTASSIUM CHLORIDE CRYS ER 20 MEQ PO TBCR
40.0000 meq | EXTENDED_RELEASE_TABLET | Freq: Once | ORAL | Status: AC
  Administered 2019-04-26: 40 meq via ORAL
  Filled 2019-04-26: qty 2

## 2019-04-26 MED ORDER — ROPINIROLE HCL 1 MG PO TABS
2.0000 mg | ORAL_TABLET | Freq: Every day | ORAL | Status: DC
  Administered 2019-04-26 (×2): 2 mg via ORAL
  Filled 2019-04-26 (×2): qty 2

## 2019-04-26 MED ORDER — ACETAMINOPHEN 325 MG PO TABS
650.0000 mg | ORAL_TABLET | Freq: Four times a day (QID) | ORAL | Status: DC | PRN
  Administered 2019-04-26 – 2019-04-27 (×3): 650 mg via ORAL
  Filled 2019-04-26 (×3): qty 2

## 2019-04-26 MED ORDER — ACETAMINOPHEN 650 MG RE SUPP: 650.0000 mg | Freq: Four times a day (QID) | RECTAL | Status: DC | PRN

## 2019-04-26 MED ORDER — HYDRALAZINE HCL 20 MG/ML IJ SOLN
10.0000 mg | INTRAMUSCULAR | Status: DC | PRN
  Filled 2019-04-26: qty 1

## 2019-04-26 MED ORDER — CITALOPRAM HYDROBROMIDE 20 MG PO TABS: 20.0000 mg | ORAL_TABLET | Freq: Every day | ORAL | Status: DC

## 2019-04-26 MED ORDER — TRAMADOL HCL 50 MG PO TABS
50.0000 mg | ORAL_TABLET | Freq: Two times a day (BID) | ORAL | Status: AC | PRN
  Administered 2019-04-26: 50 mg via ORAL
  Filled 2019-04-26: qty 1

## 2019-04-26 MED ORDER — CITALOPRAM HYDROBROMIDE 40 MG PO TABS
40.0000 mg | ORAL_TABLET | Freq: Every day | ORAL | Status: DC
  Administered 2019-04-26: 40 mg via ORAL
  Filled 2019-04-26: qty 1

## 2019-04-26 NOTE — Progress Notes (Addendum)
  Echocardiogram 2D Echocardiogram has been performed with Definity.  Katelyn Walsh 04/26/2019, 9:19 AM

## 2019-04-26 NOTE — Progress Notes (Signed)
SATURATION QUALIFICATIONS: (Thisnote is usedto comply with regulatory documentation for home oxygen)  Patient Saturations on Room Air at Rest =96%  Patient Saturations on Room Air while Ambulating =93-94%  Patient Saturations on0Liters of oxygen while Ambulating = NA%  Please briefly explain why patient needs home oxygen:Pt will not need any O2 for home. Pt was able to ambulate without it and tolerated it well. Ambulate without help of any devices.   Britta Mccreedy, RN

## 2019-04-26 NOTE — Plan of Care (Signed)
  Problem: Education: Goal: Knowledge of General Education information will improve Description Including pain rating scale, medication(s)/side effects and non-pharmacologic comfort measures Outcome: Progressing   Problem: Health Behavior/Discharge Planning: Goal: Ability to manage health-related needs will improve Outcome: Progressing   

## 2019-04-26 NOTE — Progress Notes (Addendum)
69 year old female with history of breast cancer, restless leg syndrome, hypertension, diastolic dysfunction and occasional diuretics traveled back from [place] 3 days ago, she was having some leg swelling for the last few days even before she traveled presented to emergency room with increasing lower extremity edema and shortness of breath on ambulation.  Denied any chest pain fever chills cough.  Patient also received COVID-19 vaccination on 04/24/2019. In the emergency room, temperature 101.3.  COVID-19 negative.  No clinical evidence of localized infection.  Admitted for work-up of shortness of breath and leg swelling.  Plan: No localizing evidence of infection.  Blood cultures negative so far.  Urine is normal.  CT angio of the chest no evidence of pneumonia.  Low-grade temperature probably related to COVID-19 vaccination.  Will monitor. Pulmonary embolism ruled out.  Lower extremity duplexes negative for DVT. Echocardiogram pending, treated with diuretics.  Patient will be monitored in the hospital, ambulated, monitor for need for supplemental oxygen and reevaluated later today for discharge readiness.  If all work-up negative, will discharge patient with oral diuretics and potassium supplements with outpatient follow-up.  3:45 PM: Patient reevaluated.  She still feels fatigued, has a low-grade fever and headache.  Also has right sided hip pain which is present for a long time.  She could not ambulate without help today.  We will continue to monitor in the hospital tonight.  She will continue to attempt to ambulate with nursing staff, check ambulatory oxygen.  If doing better, anticipate discharge home tomorrow morning. Patient is not safe to discharge today, will need inpatient hospital stay and monitoring.

## 2019-04-26 NOTE — Progress Notes (Signed)
Bilateral lower extremity venous duplex exam completed.  Preliminary results can be found under CV proc under chart review.  04/26/2019 11:32 AM  Suheyla Mortellaro, K., RDMS, RVT

## 2019-04-26 NOTE — H&P (Addendum)
History and Physical    Katelyn Walsh ZOX:[id number] DOB: 21-Apr-1950 DOA: 04/25/2019  PCP: Velna Hatchet, MD  Patient coming from: Home.  Chief Complaint: Shortness of breath and lower extremity edema.  HPI: Katelyn Walsh is a 69 y.o. female with history of breast cancer status post lumpectomy and radiation in 2013 being followed by oncologist, restless leg syndrome, hypertension, asthma who had recently traveled to [place] in flight over the weekend presents to the ER because of increasing lower extremity edema and shortness of breath.  Denies any chest pain fever chills productive cough nausea vomiting or diarrhea.  ED Course: In the ER patient was febrile with temperature of 101.3 F tachycardic with CT angiogram showing nothing acute Covid test was negative.  On exam patient does have bilateral lower extremity edema.  BNP was 78.4 high sensitive troponin was 4 and 4.  CBC and metabolic panel were unremarkable.  Patient was given Lasix 40 mg IV 1 dose blood cultures were obtained.  Patient was febrile and admitted for further observation.  Review of Systems: As per HPI, rest all negative.   Past Medical History:  Diagnosis Date  . Acute mastitis of left breast 02/09/2012  . Asthma    allergies  . Breast abscess 01/12/2012  . Breast cancer ([place])    07-30-2011-- stage1 dcis  aT1c,pN0, pMX----  s/p left breast partial mastectomy w/ sln bx  and radiation therapy ,  complete 10-28-2011  . Depression   . Hot flashes   . Hyperlipidemia   . Hypertension   . Mastitis chronic 04/15/2012  . Migraines   . Open breast wound    non-healing left breast surgical wound --- no draining---  dressing change at wound care center  &  hyperbaric oxyogen therapy  . OSA (obstructive sleep apnea)    NOT USED CPAP SINCE 2012  (PT STATES SLEEP STUDY WAS MILD OSA)  . Primary cancer of upper inner quadrant of left female breast ([place]) 06/29/2011   ER 100%  PR 0%  Her 2 Neu 1.51  Ki67 6%   . Rash and  nonspecific skin eruption 08/13/2011  . S/P radiation therapy 09/15/11 - 10/28/11   Left Breast  . Seasonal allergies   . Vitamin B12 deficiency anemia 07/31/2014    Past Surgical History:  Procedure Laterality Date  . BREAST BIOPSY Left 07/07/2012   Procedure: Left Breast Debridement;  Surgeon: Marcello Moores A. Cornett, MD;  Location: [facility];  Service: General;  Laterality: Left;  Left Breast Debridement  . BREAST LUMPECTOMY    . COLONOSCOPY     polyps removed in past  . LUMBAR MICRODISCECTOMY  2005   L4 -- L5  . MASTECTOMY PARTIAL / LUMPECTOMY W/ AXILLARY LYMPHADENECTOMY  07-30-2011   Left Breast : Invasive Ductal Carcinoma: 0/3  Nodes Negative: ER 100%, PR 0%, Her 2Neu Negative  . TONSILLECTOMY  AS CHILD     reports that she has never smoked. She has never used smokeless tobacco. She reports current alcohol use. She reports that she does not use drugs.  Allergies  Allergen Reactions  . Dust Mite Extract   . Pollen Extract     Family History  Problem Relation Age of Onset  . Cancer Father        brain  . Stroke Father   . Hypertension Mother     Prior to Admission medications   Medication Sig Start Date End Date Taking? Authorizing Provider  albuterol (PROVENTIL HFA;VENTOLIN HFA) 108 (90 Base) MCG/ACT inhaler  Inhale 2 puffs into the lungs every 6 (six) hours as needed for wheezing or shortness of breath.   Yes [provider]  aspirin EC 81 MG tablet Take 81 mg by mouth daily.   Yes [provider]  Azelastine HCl 137 MCG/SPRAY SOLN Place 1 puff into the nose daily. Patient taking differently: Place 1 puff into the nose daily as needed (allergies).  09/30/18  Yes Nicholas Lose, MD  benzonatate (TESSALON) 100 MG capsule Take 100-200 mg by mouth 3 (three) times daily as needed for cough.   Yes [provider]  budesonide-formoterol (SYMBICORT) 80-4.5 MCG/ACT inhaler Inhale 2 puffs into the lungs 2 (two) times daily as needed (asthma).    Yes [provider]  cholecalciferol 5000 units TABS Take 1 tablet (5,000 Units total) by mouth daily. 06/08/17  Yes Nicholas Lose, MD  citalopram (CELEXA) 40 MG tablet Take 40 mg by mouth daily.  02/20/14  Yes [provider]  furosemide (LASIX) 20 MG tablet Take 20 mg by mouth daily as needed for fluid.    Yes [provider]  hydrOXYzine (ATARAX/VISTARIL) 25 MG tablet Take 1 tablet (25 mg total) by mouth 3 (three) times daily as needed. 06/08/17  Yes Nicholas Lose, MD  losartan (COZAAR) 100 MG tablet Take 100 mg by mouth every morning.  09/06/12  Yes [provider]  montelukast (SINGULAIR) 10 MG tablet Take 10 mg by mouth at bedtime.   Yes [provider]  Probiotic Product (ALIGN) 4 MG CAPS Take 1 capsule by mouth daily with breakfast.    Yes [provider]  rOPINIRole (REQUIP) 1 MG tablet Take 1-2 mg by mouth at bedtime.    Yes [provider]  rosuvastatin (CRESTOR) 20 MG tablet Take 20 mg by mouth at bedtime.   Yes [provider]  topiramate (TOPAMAX) 100 MG tablet Take 100 mg by mouth 2 (two) times daily.   Yes [provider]  zinc gluconate 50 MG tablet Take 50 mg by mouth daily.   Yes [provider]  levocetirizine (XYZAL) 5 MG tablet Take 1 tablet (5 mg total) by mouth every evening. 06/08/17   Nicholas Lose, MD    Physical Exam: Constitutional: Moderately built and nourished. Vitals:   04/25/19 2231 04/25/19 2300 04/26/19 0147 04/26/19 0200  BP: (!) 148/69 (!) 142/91 (!) 144/78 (!) 156/77  Pulse: 95 96 92 87  Resp: (!) 24 (!) _0 Temp:      TempSrc:      SpO2: 96% 96% 95% 96%  Weight:      Height:       Eyes: Anicteric no pallor. ENMT: No discharge from the ears eyes nose or mouth. Neck: No mass felt.  No neck rigidity. Respiratory: No rhonchi or crepitations. Cardiovascular: S1-S2 heard. Abdomen: Soft nontender bowel sounds present. Musculoskeletal: Bilateral lower extremity edema  present. Skin: No rash. Neurologic: Alert awake oriented time place and person.  Moves all extremities. Psychiatric: Appears normal with normal affect.   Labs on Admission: I have personally reviewed following labs and imaging studies  CBC: Recent Labs  Lab 04/25/19 1712  WBC 6.3  HGB 12.4  HCT 39.4  MCV 96.3  PLT 789   Basic Metabolic Panel: Recent Labs  Lab 04/25/19 1712  NA 138  K 4.0  CL 107  CO2 20*  GLUCOSE 104*  BUN 13  CREATININE 0.94  CALCIUM 9.0   GFR: Estimated Creatinine Clearance: 82.6 mL/min (by C-G formula  based on SCr of 0.94 mg/dL). Liver Function Tests: No results for input(s): AST, ALT, ALKPHOS, BILITOT, PROT, ALBUMIN in the last 168 hours. No results for input(s): LIPASE, AMYLASE in the last 168 hours. No results for input(s): AMMONIA in the last 168 hours. Coagulation Profile: No results for input(s): INR, PROTIME in the last 168 hours. Cardiac Enzymes: No results for input(s): CKTOTAL, CKMB, CKMBINDEX, TROPONINI in the last 168 hours. BNP (last 3 results) No results for input(s): PROBNP in the last 8760 hours. HbA1C: No results for input(s): HGBA1C in the last 72 hours. CBG: No results for input(s): GLUCAP in the last 168 hours. Lipid Profile: No results for input(s): CHOL, HDL, LDLCALC, TRIG, CHOLHDL, LDLDIRECT in the last 72 hours. Thyroid Function Tests: No results for input(s): TSH, T4TOTAL, FREET4, T3FREE, THYROIDAB in the last 72 hours. Anemia Panel: No results for input(s): VITAMINB12, FOLATE, FERRITIN, TIBC, IRON, RETICCTPCT in the last 72 hours. Urine analysis: No results found for: COLORURINE, APPEARANCEUR, LABSPEC, [place], GLUCOSEU, HGBUR, BILIRUBINUR, KETONESUR, PROTEINUR, UROBILINOGEN, NITRITE, LEUKOCYTESUR Sepsis Labs: _0 (procalcitonin:4,lacticidven:4) ) Recent Results (from the past 240 hour(s))  Respiratory Panel by RT PCR (Flu A&B, Covid) - Nasopharyngeal Swab     Status: None   Collection Time: 04/25/19  8:09  PM   Specimen: Nasopharyngeal Swab  Result Value Ref Range Status   SARS Coronavirus 2 by RT PCR NEGATIVE NEGATIVE Final    Comment: (NOTE) SARS-CoV-2 target nucleic acids are NOT DETECTED. The SARS-CoV-2 RNA is generally detectable in upper respiratoy specimens during the acute phase of infection. The lowest concentration of SARS-CoV-2 viral copies this assay can detect is 131 copies/mL. A negative result does not preclude SARS-Cov-2 infection and should not be used as the sole basis for treatment or other patient management decisions. A negative result may occur with  improper specimen collection/handling, submission of specimen other than nasopharyngeal swab, presence of viral mutation(s) within the areas targeted by this assay, and inadequate number of viral copies (<131 copies/mL). A negative result must be combined with clinical observations, patient history, and epidemiological information. The expected result is Negative. Fact Sheet for Patients:  PinkCheek.be Fact Sheet for Healthcare Providers:  GravelBags.it This test is not yet ap proved or cleared by the [place] FDA and  has been authorized for detection and/or diagnosis of SARS-CoV-2 by FDA under an Emergency Use Authorization (EUA). This EUA will remain  in effect (meaning this test can be used) for the duration of the COVID-19 declaration under Section 564(b)(1) of the Act, 21 U.S.C. section 360bbb-3(b)(1), unless the authorization is terminated or revoked sooner.    Influenza A by PCR NEGATIVE NEGATIVE Final   Influenza B by PCR NEGATIVE NEGATIVE Final    Comment: (NOTE) The Xpert Xpress SARS-CoV-2/FLU/RSV assay is intended as an aid in  the diagnosis of influenza from Nasopharyngeal swab specimens and  should not be used as a sole basis for treatment. Nasal washings and  aspirates are unacceptable for Xpert Xpress SARS-CoV-2/FLU/RSV  testing. Fact  Sheet for Patients: PinkCheek.be Fact Sheet for Healthcare Providers: GravelBags.it This test is not yet approved or cleared by the [place] FDA and  has been authorized for detection and/or diagnosis of SARS-CoV-2 by  FDA under an Emergency Use Authorization (EUA). This EUA will remain  in effect (meaning this test can be used) for the duration of the  Covid-19 declaration under Section 564(b)(1) of the Act, 21  U.S.C. section 360bbb-3(b)(1), unless the authorization is  terminated or revoked. Performed at [facility]  [facility], [place] [address], [place], [place] [address]      Radiological Exams on Admission: DG Chest 2 View  Result Date: 04/25/2019 CLINICAL DATA:  Shortness of breath. EXAM: CHEST - 2 VIEW COMPARISON:  December 12, 2012. FINDINGS: The heart size and mediastinal contours are within normal limits. Both lungs are clear. No pneumothorax or pleural effusion is noted. The visualized skeletal structures are unremarkable. IMPRESSION: No active cardiopulmonary disease. Electronically Signed   By: Marijo Conception M.D.   On: 04/25/2019 17:24   CT Angio Chest PE W and/or Wo Contrast  Result Date: 04/25/2019 CLINICAL DATA:  Leg swelling, shortness of breath EXAM: CT ANGIOGRAPHY CHEST WITH CONTRAST TECHNIQUE: Multidetector CT imaging of the chest was performed using the standard protocol during bolus administration of intravenous contrast. Multiplanar CT image reconstructions and MIPs were obtained to evaluate the vascular anatomy. CONTRAST:  38m OMNIPAQUE IOHEXOL 350 MG/ML SOLN COMPARISON:  Chest x-ray from earlier in the same day, CT from 07/22/2018. FINDINGS: Cardiovascular: Thoracic aorta demonstrates atherosclerotic calcifications without aneurysmal dilatation or dissection. Mild coronary calcifications are seen. No sizable cardiac enlargement is noted. The pulmonary artery is well visualized within normal branching pattern. No  filling defects to suggest pulmonary emboli are identified. Mediastinum/Nodes: Thoracic inlet is within normal limits. The esophagus as visualized is unremarkable. No sizable hilar or mediastinal adenopathy is noted. Lungs/Pleura: Scattered calcified granulomas are noted. Mild emphysematous changes are seen. No focal infiltrate or sizable effusion is noted. No sizable parenchymal nodule is noted. Upper Abdomen: Stable cyst is noted within the liver. Dependent densities are noted within the gallbladder consistent with cholelithiasis. No other focal abnormality in the upper abdomen is seen. Musculoskeletal: No chest wall abnormality. No acute or significant osseous findings. Review of the MIP images confirms the above findings. IMPRESSION: No evidence of pulmonary emboli. Changes of prior granulomatous disease. Cholelithiasis. Stable hepatic cyst Aortic Atherosclerosis (ICD10-I70.0) and Emphysema (ICD10-J43.9). Electronically Signed   By: MInez CatalinaM.D.   On: 04/25/2019 22:23    EKG: Independently reviewed.  Normal sinus rhythm.  Assessment/Plan Principal Problem:   Acute respiratory failure with hypoxia (HCC) Active Problems:   Primary cancer of upper inner quadrant of left female breast ([facility])   Vitamin B12 deficiency anemia   Fever    1. Acute respiratory failure hypoxia likely from fluid overload likely from diastolic dysfunction -2D echo done in 2019 showed EF of 65 to 90% with grade 1 diastolic dysfunction.  Patient received 1 dose of Lasix 40 mg IV.  Based on the response will have further doses.  Check 2D echo Dopplers of lower extremity. 2. Fever likely from receiving Covid vaccination yesterday.  No signs of any definite infection at this time follow cultures.  Check UA. 3. Hypertension on Cozaar.  Closely monitor metabolic panel for any worsening of renal function since patient received Lasix and also IV dye. 4. Asthma presently not wheezing continue inhalers and Singulair. 5. Restless  leg syndrome on Requip. 6. History of migraine on Topamax. 7. History of breast cancer status post lumpectomy radiation being followed by oncologist.   DVT prophylaxis: Lovenox. Code Status: Full code. Family Communication: Discussed with patient. Disposition Plan: Home. Consults called: None. Admission status: Observation.   ARise PatienceMD [facility] Pager [phone]  If 7PM-7AM, please contact night-coverage [email or web address] Password [email or web address]  04/26/2019, 2:23 AM

## 2019-04-27 LAB — BASIC METABOLIC PANEL
Anion gap: 11 (ref 5–15)
BUN: 17 mg/dL (ref 8–23)
CO2: 21 mmol/L — ABNORMAL LOW (ref 22–32)
Calcium: 8.7 mg/dL — ABNORMAL LOW (ref 8.9–10.3)
Chloride: 107 mmol/L (ref 98–111)
Creatinine, Ser: 1.09 mg/dL — ABNORMAL HIGH (ref 0.44–1.00)
GFR calc Af Amer: 60 mL/min — ABNORMAL LOW (ref >60)
GFR calc non Af Amer: 52 mL/min — ABNORMAL LOW (ref >60)
Glucose, Bld: 178 mg/dL — ABNORMAL HIGH (ref 70–99)
Potassium: 4.1 mmol/L (ref 3.5–5.1)
Sodium: 139 mmol/L (ref 135–145)

## 2019-04-27 MED ORDER — FUROSEMIDE 40 MG PO TABS: 20.0000 mg | ORAL_TABLET | Freq: Every day | ORAL | 0 refills | Status: DC | PRN

## 2019-04-27 MED ORDER — CITALOPRAM HYDROBROMIDE 20 MG PO TABS: 40.0000 mg | ORAL_TABLET | Freq: Every day | ORAL | 3 refills | Status: DC

## 2019-04-27 NOTE — Discharge Summary (Signed)
Physician Discharge Summary  Katelyn Walsh [id number] DOB: 09-27-1950 DOA: 04/25/2019  PCP: Velna Hatchet, MD  Admit date: 04/25/2019 Discharge date: 04/27/2019  Admitted From: Home Disposition: Home  Recommendations for Outpatient Follow-up:  1. Follow up with PCP in 1-2 weeks 2. Please obtain BMP/CBC in one week   Home Health: Not needed Equipment/Devices: None needed  Discharge Condition: Stable CODE STATUS: Full code Diet recommendation: Low-salt diet  Discharge summary: 69 year old female with history of breast cancer, restless leg syndrome, hypertension, diastolic dysfunction on  occasional diuretics traveled back from [place] 3 days ago, she was having some leg swelling for the last few days even before she traveled presented to emergency room with increasing lower extremity edema and shortness of breath on ambulation.  Denied any chest pain, fever, chills, cough.  Patient also received COVID-19 vaccination on 04/24/2019 with [employer]. In the emergency room, temperature 101.3.  COVID-19 negative.  No clinical evidence of localized infection.  Admitted for work-up of shortness of breath and leg swelling.  Patient was found with no localizing evidence of infection.  Blood cultures negative so far.  Urine is normal.  CT angio of the chest no evidence of pneumonia.  Low-grade temperature probably related to COVID-19 vaccination.   CT of the chest was done, pulmonary embolism ruled out. Lower extremity duplex is negative for DVT. 2D echocardiogram shows grade 1 diastolic dysfunction as before, unchanged.  Ejection fraction is normal. Patient had good response to diuretic therapy. Her presentation was likely from fluid overload due to diastolic dysfunction that has improved.  Plan: Discharge home.  Continue all her long-term medications.  We will advised to use Lasix 40 mg daily for 1 to 2 days if she develops any leg swelling.  Since she is not volume overloaded, would not suggest  scheduled diuretics.  She will decrease her water intake to less than 32 ounces per day. She had mild increase in her creatinine in the hospital, will suggest to recheck in 1 to 2 weeks.    Discharge Diagnoses:  Principal Problem:   Acute respiratory failure with hypoxia ([place]) Active Problems:   Primary cancer of upper inner quadrant of left female breast ([place])   Vitamin B12 deficiency anemia   Fever    Discharge Instructions  Discharge Instructions    (HEART FAILURE PATIENTS) Call MD:  Anytime you have any of the following symptoms: 1) 3 pound weight gain in 24 hours or 5 pounds in 1 week 2) shortness of breath, with or without a dry hacking cough 3) swelling in the hands, feet or stomach 4) if you have to sleep on extra pillows at night in order to breathe.   Complete by: As directed    Diet - low sodium heart healthy   Complete by: As directed    Discharge instructions   Complete by: As directed    Will need repeat renal function in 1-2 weeks Take lasix 40 mg daily as needed if you notice swelling/ edema, weight gain more than 3 lbs Reduced dose of your celexa to 20 mg to adjust to your age and kidney functions   Increase activity slowly   Complete by: As directed      Allergies as of 04/27/2019      Reactions   Dust Mite Extract    Pollen Extract       Medication List    TAKE these medications   albuterol 108 (90 Base) MCG/ACT inhaler Commonly known as: VENTOLIN HFA Inhale 2 puffs  into the lungs every 6 (six) hours as needed for wheezing or shortness of breath.   Align 4 MG Caps Take 1 capsule by mouth daily with breakfast.   aspirin EC 81 MG tablet Take 81 mg by mouth daily.   Azelastine HCl 137 MCG/SPRAY Soln Place 1 puff into the nose daily. What changed:   when to take this  reasons to take this   benzonatate 100 MG capsule Commonly known as: TESSALON Take 100-200 mg by mouth 3 (three) times daily as needed for cough.   budesonide-formoterol 80-4.5  MCG/ACT inhaler Commonly known as: SYMBICORT Inhale 2 puffs into the lungs 2 (two) times daily as needed (asthma).   citalopram 20 MG tablet Commonly known as: CELEXA Take 2 tablets (40 mg total) by mouth daily. What changed: medication strength   furosemide 40 MG tablet Commonly known as: LASIX Take 0.5 tablets (20 mg total) by mouth daily as needed for fluid or edema (take 1 tablet a day for 2 to 3 days if you notice leg swelling or weight gain). What changed:   medication strength  reasons to take this   hydrOXYzine 25 MG tablet Commonly known as: ATARAX/VISTARIL Take 1 tablet (25 mg total) by mouth 3 (three) times daily as needed.   levocetirizine 5 MG tablet Commonly known as: XYZAL Take 1 tablet (5 mg total) by mouth every evening.   losartan 100 MG tablet Commonly known as: COZAAR Take 100 mg by mouth every morning.   montelukast 10 MG tablet Commonly known as: SINGULAIR Take 10 mg by mouth at bedtime.   rOPINIRole 1 MG tablet Commonly known as: REQUIP Take 1-2 mg by mouth at bedtime.   rosuvastatin 20 MG tablet Commonly known as: CRESTOR Take 20 mg by mouth at bedtime.   topiramate 100 MG tablet Commonly known as: TOPAMAX Take 100 mg by mouth 2 (two) times daily.   Vitamin D3 125 MCG (5000 UT) Tabs Take 1 tablet (5,000 Units total) by mouth daily.   zinc gluconate 50 MG tablet Take 50 mg by mouth daily.       Allergies  Allergen Reactions  . Dust Mite Extract   . Pollen Extract       Procedures/Studies: DG Chest 2 View  Result Date: 04/25/2019 CLINICAL DATA:  Shortness of breath. EXAM: CHEST - 2 VIEW COMPARISON:  December 12, 2012. FINDINGS: The heart size and mediastinal contours are within normal limits. Both lungs are clear. No pneumothorax or pleural effusion is noted. The visualized skeletal structures are unremarkable. IMPRESSION: No active cardiopulmonary disease. Electronically Signed   By: Marijo Conception M.D.   On: 04/25/2019 17:24    CT Angio Chest PE W and/or Wo Contrast  Result Date: 04/25/2019 CLINICAL DATA:  Leg swelling, shortness of breath EXAM: CT ANGIOGRAPHY CHEST WITH CONTRAST TECHNIQUE: Multidetector CT imaging of the chest was performed using the standard protocol during bolus administration of intravenous contrast. Multiplanar CT image reconstructions and MIPs were obtained to evaluate the vascular anatomy. CONTRAST:  9mL OMNIPAQUE IOHEXOL 350 MG/ML SOLN COMPARISON:  Chest x-ray from earlier in the same day, CT from 07/22/2018. FINDINGS: Cardiovascular: Thoracic aorta demonstrates atherosclerotic calcifications without aneurysmal dilatation or dissection. Mild coronary calcifications are seen. No sizable cardiac enlargement is noted. The pulmonary artery is well visualized within normal branching pattern. No filling defects to suggest pulmonary emboli are identified. Mediastinum/Nodes: Thoracic inlet is within normal limits. The esophagus as visualized is unremarkable. No sizable hilar or mediastinal adenopathy is noted. Lungs/Pleura:  Scattered calcified granulomas are noted. Mild emphysematous changes are seen. No focal infiltrate or sizable effusion is noted. No sizable parenchymal nodule is noted. Upper Abdomen: Stable cyst is noted within the liver. Dependent densities are noted within the gallbladder consistent with cholelithiasis. No other focal abnormality in the upper abdomen is seen. Musculoskeletal: No chest wall abnormality. No acute or significant osseous findings. Review of the MIP images confirms the above findings. IMPRESSION: No evidence of pulmonary emboli. Changes of prior granulomatous disease. Cholelithiasis. Stable hepatic cyst Aortic Atherosclerosis (ICD10-I70.0) and Emphysema (ICD10-J43.9). Electronically Signed   By: Inez Catalina M.D.   On: 04/25/2019 22:23   ECHOCARDIOGRAM COMPLETE  Result Date: 04/26/2019    ECHOCARDIOGRAM REPORT   Patient Name:   [facility] Date of Exam: 04/26/2019 Medical Rec #:   [id number]      Height:       65.2 in Accession #:    [id number]     Weight:       230.8 lb Date of Birth:  04-10-1950      BSA:          2.108 m Patient Age:    14 years       BP:           127/68 mmHg Patient Gender: F              HR:           83 bpm. Exam Location:  Inpatient Procedure: 2D Echo, Cardiac Doppler, Color Doppler and Intracardiac            Opacification Agent Indications:    Dyspnea 786.09/R06.00  History:        Patient has prior history of Echocardiogram examinations, most                 recent 02/14/2018. Risk Factors:Sleep Apnea, Dyslipidemia and                 Hypertension. H/o breast cancer.  Sonographer:    Clayton Lefort RDCS (AE) Referring Phys: [place]  1. Left ventricular ejection fraction, by estimation, is 55 to 60%. The left ventricle has normal function. The left ventricle has no regional wall motion abnormalities. There is mild left ventricular hypertrophy. Left ventricular diastolic parameters are consistent with Grade I diastolic dysfunction (impaired relaxation).  2. Right ventricular systolic function is normal. The right ventricular size is normal. There is normal pulmonary artery systolic pressure. The estimated right ventricular systolic pressure is 0000000 mmHg.  3. The mitral valve is normal in structure and function. No evidence of mitral valve regurgitation. No evidence of mitral stenosis.  4. The aortic valve is tricuspid. Aortic valve regurgitation is not visualized. No aortic stenosis is present.  5. The inferior vena cava is normal in size with greater than 50% respiratory variability, suggesting right atrial pressure of 3 mmHg. FINDINGS  Left Ventricle: Left ventricular ejection fraction, by estimation, is 55 to 60%. The left ventricle has normal function. The left ventricle has no regional wall motion abnormalities. Definity contrast agent was given IV to delineate the left ventricular  endocardial borders. The left ventricular internal  cavity size was normal in size. There is mild left ventricular hypertrophy. Left ventricular diastolic parameters are consistent with Grade I diastolic dysfunction (impaired relaxation). Right Ventricle: The right ventricular size is normal. No increase in right ventricular wall thickness. Right ventricular systolic function is normal. There is normal pulmonary artery systolic pressure. The tricuspid  regurgitant velocity is 2.26 m/s, and  with an assumed right atrial pressure of 3 mmHg, the estimated right ventricular systolic pressure is 0000000 mmHg. Left Atrium: Left atrial size was normal in size. Right Atrium: Right atrial size was normal in size. Pericardium: There is no evidence of pericardial effusion. Mitral Valve: The mitral valve is normal in structure and function. No evidence of mitral valve regurgitation. No evidence of mitral valve stenosis. MV peak gradient, 6.2 mmHg. The mean mitral valve gradient is 2.0 mmHg. Tricuspid Valve: The tricuspid valve is normal in structure. Tricuspid valve regurgitation is trivial. Aortic Valve: The aortic valve is tricuspid. Aortic valve regurgitation is not visualized. No aortic stenosis is present. Aortic valve mean gradient measures 6.0 mmHg. Aortic valve peak gradient measures 11.8 mmHg. Aortic valve area, by VTI measures 2.32  cm. Pulmonic Valve: The pulmonic valve was normal in structure. Pulmonic valve regurgitation is not visualized. Aorta: The aortic root is normal in size and structure. Venous: The inferior vena cava is normal in size with greater than 50% respiratory variability, suggesting right atrial pressure of 3 mmHg. IAS/Shunts: No atrial level shunt detected by color flow Doppler.  LEFT VENTRICLE PLAX 2D LVIDd:         4.19 cm  Diastology LVIDs:         2.41 cm  LV e' lateral:   11.60 cm/s LV PW:         1.27 cm  LV E/e' lateral: 8.6 LV IVS:        1.29 cm  LV e' medial:    7.51 cm/s LVOT diam:     1.90 cm  LV E/e' medial:  13.3 LV SV:         79 LV SV  Index:   37 LVOT Area:     2.84 cm  RIGHT VENTRICLE             IVC RV Basal diam:  2.52 cm     IVC diam: 1.79 cm RV S prime:     12.30 cm/s TAPSE (M-mode): 2.4 cm LEFT ATRIUM             Index       RIGHT ATRIUM           Index LA diam:        4.10 cm 1.95 cm/m  RA Area:     11.70 cm LA Vol (A2C):   75.4 ml 35.77 ml/m RA Volume:   21.40 ml  10.15 ml/m LA Vol (A4C):   44.9 ml 21.30 ml/m LA Biplane Vol: 58.3 ml 27.66 ml/m  AORTIC VALVE AV Area (Vmax):    2.13 cm AV Area (Vmean):   2.14 cm AV Area (VTI):     2.32 cm AV Vmax:           172.00 cm/s AV Vmean:          117.000 cm/s AV VTI:            0.340 m AV Peak Grad:      11.8 mmHg AV Mean Grad:      6.0 mmHg LVOT Vmax:         129.00 cm/s LVOT Vmean:        88.400 cm/s LVOT VTI:          0.278 m LVOT/AV VTI ratio: 0.82  AORTA Ao Root diam: 3.10 cm Ao Asc diam:  3.50 cm MITRAL VALVE  TRICUSPID VALVE MV Area (PHT): 2.93 cm     TR Peak grad:   20.4 mmHg MV Peak grad:  6.2 mmHg     TR Vmax:        226.00 cm/s MV Mean grad:  2.0 mmHg MV Vmax:       1.25 m/s     SHUNTS MV Vmean:      65.1 cm/s    Systemic VTI:  0.28 m MV Decel Time: 259 msec     Systemic Diam: 1.90 cm MV E velocity: 100.00 cm/s MV A velocity: 125.00 cm/s MV E/A ratio:  0.80 Loralie Champagne MD Electronically signed by Loralie Champagne MD Signature Date/Time: 04/26/2019/4:14:56 PM    Final    VAS [place] LOWER EXTREMITY VENOUS (DVT)  Result Date: 04/26/2019  Lower Venous DVTStudy Indications: Edema.  Risk Factors: Cancer Breast Flew to [place] few days ago. Anticoagulation: Lovenox. Limitations: Body habitus and poor ultrasound/tissue interface. Comparison Study: No prior exam. Performing Technologist: Baldwin Crown ARDMS, RVT  Examination Guidelines: A complete evaluation includes B-mode imaging, spectral Doppler, color Doppler, and power Doppler as needed of all accessible portions of each vessel. Bilateral testing is considered an integral part of a complete examination. Limited  examinations for reoccurring indications may be performed as noted. The reflux portion of the exam is performed with the patient in reverse Trendelenburg.  +---------+---------------+---------+-----------+----------+------------------+ RIGHT    CompressibilityPhasicitySpontaneityPropertiesThrombus Aging     +---------+---------------+---------+-----------+----------+------------------+ CFV      Full           Yes      Yes                                     +---------+---------------+---------+-----------+----------+------------------+ SFJ      Full                                                            +---------+---------------+---------+-----------+----------+------------------+ FV Prox  Full                                                            +---------+---------------+---------+-----------+----------+------------------+ FV Mid   Full                                                            +---------+---------------+---------+-----------+----------+------------------+ FV DistalFull                                                            +---------+---------------+---------+-----------+----------+------------------+ PFV      Full                                                            +---------+---------------+---------+-----------+----------+------------------+  POP      Full                                                            +---------+---------------+---------+-----------+----------+------------------+ PTV      Full                                         visualized with                                                          color              +---------+---------------+---------+-----------+----------+------------------+ PERO     Full                                         visualized with                                                          color               +---------+---------------+---------+-----------+----------+------------------+   +---------+---------------+---------+-----------+----------+------------------+ LEFT     CompressibilityPhasicitySpontaneityPropertiesThrombus Aging     +---------+---------------+---------+-----------+----------+------------------+ CFV      Full           Yes      Yes                                     +---------+---------------+---------+-----------+----------+------------------+ SFJ      Full                                                            +---------+---------------+---------+-----------+----------+------------------+ FV Prox  Full                                                            +---------+---------------+---------+-----------+----------+------------------+ FV Mid   Full                                                            +---------+---------------+---------+-----------+----------+------------------+ FV DistalFull                                                            +---------+---------------+---------+-----------+----------+------------------+  PFV      Full                                                            +---------+---------------+---------+-----------+----------+------------------+ POP      Full                                                            +---------+---------------+---------+-----------+----------+------------------+ PTV      Full                                         visualized with                                                          color              +---------+---------------+---------+-----------+----------+------------------+ PERO     Full                                         visualized with                                                          color              +---------+---------------+---------+-----------+----------+------------------+    Summary: BILATERAL: - No  evidence of deep vein thrombosis seen in the lower extremities, bilaterally.  RIGHT: - No cystic structure found in the popliteal fossa.  LEFT: - No cystic structure found in the popliteal fossa.  *See table(s) above for measurements and observations. Electronically signed by Harold Barban MD on 04/26/2019 at 8:55:56 PM.    Final     Subjective: Patient was seen and examined.  No overnight events.  She ambulated in the hallway and did not need any oxygen.  Has occasional wheezing.  Still has some ankle swelling but better than before.   Discharge Exam: Vitals:   04/26/19 2049 04/27/19 0409  BP: 125/65 125/79  Pulse: 74 80  Resp: 20 18  Temp: 98.3 F (36.8 C) 98.5 F (36.9 C)  SpO2: 96% 93%   Vitals:   04/26/19 1805 04/26/19 2049 04/27/19 0409 04/27/19 0500  BP: 121/68 125/65 125/79   Pulse: 71 74 80   Resp: 20 20 18    Temp: 98.2 F (36.8 C) 98.3 F (36.8 C) 98.5 F (36.9 C)   TempSrc: Oral Oral Oral   SpO2: 97% 96% 93%   Weight:    103.6 kg  Height:        General: Pt is alert, awake, not in acute distress Cardiovascular:  RRR, S1/S2 +, no rubs, no gallops Respiratory: CTA bilaterally, no wheezing, no rhonchi Abdominal: Soft, NT, ND, bowel sounds + Extremities: no edema, no cyanosis    The results of significant diagnostics from this hospitalization (including imaging, microbiology, ancillary and laboratory) are listed below for reference.     Microbiology: Recent Results (from the past 240 hour(s))  Culture, blood (routine x 2)     Status: None (Preliminary result)   Collection Time: 04/25/19  8:09 PM   Specimen: BLOOD  Result Value Ref Range Status   Specimen Description BLOOD RIGHT ANTECUBITAL  Final   Special Requests   Final    BOTTLES DRAWN AEROBIC AND ANAEROBIC Blood Culture adequate volume   Culture   Final    NO GROWTH < 24 HOURS Performed at [facility], [address] [place], [place] [address]    Report Status PENDING  Incomplete  Respiratory  Panel by RT PCR (Flu A&B, Covid) - Nasopharyngeal Swab     Status: None   Collection Time: 04/25/19  8:09 PM   Specimen: Nasopharyngeal Swab  Result Value Ref Range Status   SARS Coronavirus 2 by RT PCR NEGATIVE NEGATIVE Final    Comment: (NOTE) SARS-CoV-2 target nucleic acids are NOT DETECTED. The SARS-CoV-2 RNA is generally detectable in upper respiratoy specimens during the acute phase of infection. The lowest concentration of SARS-CoV-2 viral copies this assay can detect is 131 copies/mL. A negative result does not preclude SARS-Cov-2 infection and should not be used as the sole basis for treatment or other patient management decisions. A negative result may occur with  improper specimen collection/handling, submission of specimen other than nasopharyngeal swab, presence of viral mutation(s) within the areas targeted by this assay, and inadequate number of viral copies (<131 copies/mL). A negative result must be combined with clinical observations, patient history, and epidemiological information. The expected result is Negative. Fact Sheet for Patients:  PinkCheek.be Fact Sheet for Healthcare Providers:  GravelBags.it This test is not yet ap proved or cleared by the [place] FDA and  has been authorized for detection and/or diagnosis of SARS-CoV-2 by FDA under an Emergency Use Authorization (EUA). This EUA will remain  in effect (meaning this test can be used) for the duration of the COVID-19 declaration under Section 564(b)(1) of the Act, 21 U.S.C. section 360bbb-3(b)(1), unless the authorization is terminated or revoked sooner.    Influenza A by PCR NEGATIVE NEGATIVE Final   Influenza B by PCR NEGATIVE NEGATIVE Final    Comment: (NOTE) The Xpert Xpress SARS-CoV-2/FLU/RSV assay is intended as an aid in  the diagnosis of influenza from Nasopharyngeal swab specimens and  should not be used as a sole basis for  treatment. Nasal washings and  aspirates are unacceptable for Xpert Xpress SARS-CoV-2/FLU/RSV  testing. Fact Sheet for Patients: PinkCheek.be Fact Sheet for Healthcare Providers: GravelBags.it This test is not yet approved or cleared by the [place] FDA and  has been authorized for detection and/or diagnosis of SARS-CoV-2 by  FDA under an Emergency Use Authorization (EUA). This EUA will remain  in effect (meaning this test can be used) for the duration of the  Covid-19 declaration under Section 564(b)(1) of the Act, 21  U.S.C. section 360bbb-3(b)(1), unless the authorization is  terminated or revoked. Performed at [facility], [place] [address], [place], [place] [address]   Culture, blood (routine x 2)     Status: None (Preliminary result)   Collection Time: 04/25/19  8:13 PM  Specimen: BLOOD RIGHT HAND  Result Value Ref Range Status   Specimen Description BLOOD RIGHT HAND  Final   Special Requests   Final    AEROBIC BOTTLE ONLY Blood Culture results may not be optimal due to an inadequate volume of blood received in culture bottles   Culture   Final    NO GROWTH < 24 HOURS Performed at [place] [address], [place], [place] [address]    Report Status PENDING  Incomplete     Labs: BNP (last 3 results) Recent Labs    04/25/19 1950  BNP [id number]   Basic Metabolic Panel: Recent Labs  Lab 04/25/19 1712 04/26/19 0323 04/27/19 0318  NA 138 138 139  K 4.0 3.2* 4.1  CL 107 103 107  CO2 20* 23 21*  GLUCOSE 104* 123* 178*  BUN 13 12 17   CREATININE 0.94 1.02* 1.09*  CALCIUM 9.0 9.0 8.7*   Liver Function Tests: No results for input(s): AST, ALT, ALKPHOS, BILITOT, PROT, ALBUMIN in the last 168 hours. No results for input(s): LIPASE, AMYLASE in the last 168 hours. No results for input(s): AMMONIA in the last 168 hours. CBC: Recent Labs  Lab 04/25/19 1712 04/26/19 0323  WBC 6.3 6.0  HGB 12.4  12.6  HCT 39.4 40.5  MCV 96.3 96.2  PLT 172 171   Cardiac Enzymes: No results for input(s): CKTOTAL, CKMB, CKMBINDEX, TROPONINI in the last 168 hours. BNP: Invalid input(s): POCBNP CBG: No results for input(s): GLUCAP in the last 168 hours. D-Dimer Recent Labs    04/25/19 1951  DDIMER 0.31   Hgb A1c No results for input(s): HGBA1C in the last 72 hours. Lipid Profile No results for input(s): CHOL, HDL, LDLCALC, TRIG, CHOLHDL, LDLDIRECT in the last 72 hours. Thyroid function studies No results for input(s): TSH, T4TOTAL, T3FREE, THYROIDAB in the last 72 hours.  Invalid input(s): FREET3 Anemia work up No results for input(s): VITAMINB12, FOLATE, FERRITIN, TIBC, IRON, RETICCTPCT in the last 72 hours. Urinalysis No results found for: COLORURINE, APPEARANCEUR, [place], [place], [place], [place], [place], [place], PROTEINUR, UROBILINOGEN, NITRITE, LEUKOCYTESUR Sepsis Labs Invalid input(s): PROCALCITONIN,  WBC,  LACTICIDVEN Microbiology Recent Results (from the past 240 hour(s))  Culture, blood (routine x 2)     Status: None (Preliminary result)   Collection Time: 04/25/19  8:09 PM   Specimen: BLOOD  Result Value Ref Range Status   Specimen Description BLOOD RIGHT ANTECUBITAL  Final   Special Requests   Final    BOTTLES DRAWN AEROBIC AND ANAEROBIC Blood Culture adequate volume   Culture   Final    NO GROWTH < 24 HOURS Performed at [facility], [address], [place],  [address]    Report Status PENDING  Incomplete  Respiratory Panel by RT PCR (Flu A&B, Covid) - Nasopharyngeal Swab     Status: None   Collection Time: 04/25/19  8:09 PM   Specimen: Nasopharyngeal Swab  Result Value Ref Range Status   SARS Coronavirus 2 by RT PCR NEGATIVE NEGATIVE Final    Comment: (NOTE) SARS-CoV-2 target nucleic acids are NOT DETECTED. The SARS-CoV-2 RNA is generally detectable in upper respiratoy specimens during the acute phase of infection. The lowest concentration  of SARS-CoV-2 viral copies this assay can detect is 131 copies/mL. A negative result does not preclude SARS-Cov-2 infection and should not be used as the sole basis for treatment or other patient management decisions. A negative result may occur with  improper specimen collection/handling, submission of specimen other  than nasopharyngeal swab, presence of viral mutation(s) within the areas targeted by this assay, and inadequate number of viral copies (<131 copies/mL). A negative result must be combined with clinical observations, patient history, and epidemiological information. The expected result is Negative. Fact Sheet for Patients:  PinkCheek.be Fact Sheet for Healthcare Providers:  GravelBags.it This test is not yet ap proved or cleared by the [place] FDA and  has been authorized for detection and/or diagnosis of SARS-CoV-2 by FDA under an Emergency Use Authorization (EUA). This EUA will remain  in effect (meaning this test can be used) for the duration of the COVID-19 declaration under Section 564(b)(1) of the Act, 21 U.S.C. section 360bbb-3(b)(1), unless the authorization is terminated or revoked sooner.    Influenza A by PCR NEGATIVE NEGATIVE Final   Influenza B by PCR NEGATIVE NEGATIVE Final    Comment: (NOTE) The Xpert Xpress SARS-CoV-2/FLU/RSV assay is intended as an aid in  the diagnosis of influenza from Nasopharyngeal swab specimens and  should not be used as a sole basis for treatment. Nasal washings and  aspirates are unacceptable for Xpert Xpress SARS-CoV-2/FLU/RSV  testing. Fact Sheet for Patients: PinkCheek.be Fact Sheet for Healthcare Providers: GravelBags.it This test is not yet approved or cleared by the [place] FDA and  has been authorized for detection and/or diagnosis of SARS-CoV-2 by  FDA under an Emergency Use Authorization (EUA).  This EUA will remain  in effect (meaning this test can be used) for the duration of the  Covid-19 declaration under Section 564(b)(1) of the Act, 21  U.S.C. section 360bbb-3(b)(1), unless the authorization is  terminated or revoked. Performed at [facility], [place] [address], [place], [place] [address]   Culture, blood (routine x 2)     Status: None (Preliminary result)   Collection Time: 04/25/19  8:13 PM   Specimen: BLOOD RIGHT HAND  Result Value Ref Range Status   Specimen Description BLOOD RIGHT HAND  Final   Special Requests   Final    AEROBIC BOTTLE ONLY Blood Culture results may not be optimal due to an inadequate volume of blood received in culture bottles   Culture   Final    NO GROWTH < 24 HOURS Performed at [facility], [place] [address] [place], [place] [address]    Report Status PENDING  Incomplete     Time coordinating discharge: 25 minutes  SIGNED:   Barb Merino, MD  [facility] 04/27/2019, 8:35 AM

## 2019-04-27 NOTE — Progress Notes (Signed)
DISCHARGE NOTE HOME Katelyn Walsh to be discharged Home per MD order. Discussed prescriptions and follow up appointments with the patient. Prescriptions given to patient; medication list explained in detail. Patient verbalized understanding.  Skin clean, dry and intact without evidence of skin break down, no evidence of skin tears noted. IV catheter discontinued intact. Site without signs and symptoms of complications. Dressing and pressure applied. Pt denies pain at the site currently. No complaints noted.  Patient free of lines, drains, and wounds.   An After Visit Summary (AVS) was printed and given to the patient. Patient escorted via wheelchair, and discharged home via private auto.  Paulla Fore, RN, BSN

## 2019-04-28 DIAGNOSIS — M7061 Trochanteric bursitis, right hip: Secondary | ICD-10-CM | POA: Diagnosis not present

## 2019-04-28 DIAGNOSIS — M25551 Pain in right hip: Secondary | ICD-10-CM | POA: Diagnosis not present

## 2019-04-30 LAB — CULTURE, BLOOD (ROUTINE X 2)
Culture: NO GROWTH
Culture: NO GROWTH
Special Requests: ADEQUATE

## 2019-05-05 DIAGNOSIS — F418 Other specified anxiety disorders: Secondary | ICD-10-CM | POA: Diagnosis not present

## 2019-05-05 DIAGNOSIS — R0609 Other forms of dyspnea: Secondary | ICD-10-CM | POA: Diagnosis not present

## 2019-05-05 DIAGNOSIS — R600 Localized edema: Secondary | ICD-10-CM | POA: Diagnosis not present

## 2019-05-05 DIAGNOSIS — F330 Major depressive disorder, recurrent, mild: Secondary | ICD-10-CM | POA: Diagnosis not present

## 2019-05-05 DIAGNOSIS — N1831 Chronic kidney disease, stage 3a: Secondary | ICD-10-CM | POA: Diagnosis not present

## 2019-05-05 DIAGNOSIS — E1169 Type 2 diabetes mellitus with other specified complication: Secondary | ICD-10-CM | POA: Diagnosis not present

## 2019-05-08 DIAGNOSIS — M7061 Trochanteric bursitis, right hip: Secondary | ICD-10-CM | POA: Diagnosis not present

## 2019-05-08 DIAGNOSIS — M25551 Pain in right hip: Secondary | ICD-10-CM | POA: Diagnosis not present

## 2019-05-10 DIAGNOSIS — M7061 Trochanteric bursitis, right hip: Secondary | ICD-10-CM | POA: Diagnosis not present

## 2019-05-10 DIAGNOSIS — M25551 Pain in right hip: Secondary | ICD-10-CM | POA: Diagnosis not present

## 2019-05-11 ENCOUNTER — Encounter (INDEPENDENT_AMBULATORY_CARE_PROVIDER_SITE_OTHER): Payer: Self-pay | Admitting: Family Medicine

## 2019-05-11 ENCOUNTER — Other Ambulatory Visit: Payer: Self-pay

## 2019-05-11 ENCOUNTER — Ambulatory Visit (INDEPENDENT_AMBULATORY_CARE_PROVIDER_SITE_OTHER): Payer: Medicare Other | Admitting: Family Medicine

## 2019-05-11 VITALS — BP 119/76 | HR 85 | Temp 98.6°F | Ht 64.0 in | Wt 224.0 lb

## 2019-05-11 DIAGNOSIS — I10 Essential (primary) hypertension: Secondary | ICD-10-CM | POA: Diagnosis not present

## 2019-05-11 DIAGNOSIS — E538 Deficiency of other specified B group vitamins: Secondary | ICD-10-CM | POA: Diagnosis not present

## 2019-05-11 DIAGNOSIS — E7849 Other hyperlipidemia: Secondary | ICD-10-CM | POA: Diagnosis not present

## 2019-05-11 DIAGNOSIS — Z1331 Encounter for screening for depression: Secondary | ICD-10-CM | POA: Diagnosis not present

## 2019-05-11 DIAGNOSIS — R739 Hyperglycemia, unspecified: Secondary | ICD-10-CM

## 2019-05-11 DIAGNOSIS — R0602 Shortness of breath: Secondary | ICD-10-CM

## 2019-05-11 DIAGNOSIS — R5383 Other fatigue: Secondary | ICD-10-CM

## 2019-05-11 DIAGNOSIS — E559 Vitamin D deficiency, unspecified: Secondary | ICD-10-CM

## 2019-05-11 DIAGNOSIS — Z6838 Body mass index (BMI) 38.0-38.9, adult: Secondary | ICD-10-CM

## 2019-05-11 DIAGNOSIS — Z0289 Encounter for other administrative examinations: Secondary | ICD-10-CM

## 2019-05-11 NOTE — Progress Notes (Signed)
Chief Complaint:   OBESITY Katelyn Walsh (MR# [id number]) is a 69 y.o. female who presents for evaluation and treatment of obesity and related comorbidities. Current BMI is Body mass index is 38.45 kg/m. Katelyn Walsh has been struggling with her weight for many years and has been unsuccessful in either losing weight, maintaining weight loss, or reaching her healthy weight goal.  Katelyn Walsh is celebrating her 69th birthday in [place] next week. She will try to follow her Category 2 meal plan as much as possible while traveling.  Katelyn Walsh is currently in the action stage of change and ready to dedicate time achieving and maintaining a healthier weight. Katelyn Walsh is interested in becoming our patient and working on intensive lifestyle modifications including (but not limited to) diet and exercise for weight loss.  Katelyn Walsh's habits were reviewed today and are as follows: she started gaining weight after her husband passed away, her heaviest weight ever was 240 pounds, she has significant food cravings issues, she snacks frequently in the evenings, she frequently eats larger portions than normal and she struggles with emotional eating.  Depression Screen Katelyn Walsh's Food and Mood (modified PHQ-9) score was 11.  Depression screen PHQ 2/9 05/11/2019  Decreased Interest 1  Down, Depressed, Hopeless 1  PHQ - 2 Score 2  Altered sleeping 1  Tired, decreased energy 3  Change in appetite 3  Feeling bad or failure about yourself  0  Trouble concentrating 1  Moving slowly or fidgety/restless 1  Suicidal thoughts 0  PHQ-9 Score 11  Difficult doing work/chores Somewhat difficult  Some recent data might be hidden   Subjective:   1. Other fatigue Katelyn Walsh admits to daytime somnolence and admits to waking up still tired. Patent has a history of symptoms of daytime fatigue and morning headache. Katelyn Walsh generally gets 6 or 8 hours of sleep per night, and states that she has generally restful sleep. Snoring is present.  Apneic episodes are not present. Epworth Sleepiness Score is 3.  2. Shortness of breath on exertion Katelyn Walsh notes increasing shortness of breath with exercising and seems to be worsening over time with weight gain. She notes getting out of breath sooner with activity than she used to. This has not gotten worse recently. Katelyn Walsh denies shortness of breath at rest or orthopnea.  3. Essential hypertension Katelyn Walsh is currently on losartan 100 mg q daily.  4. Other hyperlipidemia Katelyn Walsh is currently on rosuvastatin 20 mg q daily and she is tolerating it well.  5. Hyperglycemia Katelyn Walsh has had previously elevated BG readings in the past. Her last BGs was 141 on 05/05/2019 and A1c of 6.1. She is not currently on anti-diabetic medications.  6. Vitamin D deficiency Katelyn Walsh is currently on OTC Vit D3 5,000 IU q daily.   7. B12 nutritional deficiency Katelyn Walsh has a history of B12 deficiency in Epic.  Assessment/Plan:   1. Other fatigue Katelyn Walsh does feel that her weight is causing her energy to be lower than it should be. Fatigue may be related to obesity, depression or many other causes. Labs will be ordered, and in the meanwhile, Katelyn Walsh will focus on self care including making healthy food choices, increasing physical activity and focusing on stress reduction.  2. Shortness of breath on exertion Katelyn Walsh does feel that she gets out of breath more easily that she used to when she exercises. Katelyn Walsh's shortness of breath appears to be obesity related and exercise induced. She has agreed to work on weight loss and gradually increase exercise  to treat her exercise induced shortness of breath. Will continue to monitor closely.  3. Essential hypertension Katelyn Walsh will begin her Category 2 meal plan, and will work on healthy weight loss and exercise to improve blood pressure control. We will watch for signs of hypotension as she continues her lifestyle modifications. We will check labs today.  - Comprehensive metabolic  panel  4. Other hyperlipidemia Cardiovascular risk and specific lipid/LDL goals reviewed. We discussed several lifestyle modifications today and Katelyn Walsh will begin her Category 2 meal plan, and will continue to work on exercise and weight loss efforts. We will recheck labs in 3 months. Orders and follow up as documented in patient record.   5. Hyperglycemia Fasting labs will be obtained today and results with be discussed with Katelyn Walsh in 2 weeks at her follow up visit. In the meanwhile Katelyn Walsh will begin her Category 2 meal plan and will work on weight loss efforts.  - Insulin, random  6. Vitamin D deficiency Low Vitamin D level contributes to fatigue and are associated with obesity, breast, and colon cancer. Katelyn Walsh will follow-up for routine testing of Vitamin D, at least 2-3 times per year to avoid over-replacement. We will recheck labs in 3 months.  7. B12 nutritional deficiency The diagnosis was reviewed with the patient. We will continue to monitor. We will check labs today. Orders and follow up as documented in patient record.  - Vitamin B12  8. Depression screening Katelyn Walsh had a positive depression screening. Depression is commonly associated with obesity and often results in emotional eating behaviors. We will monitor this closely and work on CBT to help improve the non-hunger eating patterns. Referral to Psychology may be required if no improvement is seen as she continues in our clinic.  9. Class 2 severe obesity with serious comorbidity and body mass index (BMI) of 38.0 to 38.9 in adult, unspecified obesity type (HCC) Katelyn Walsh is currently in the action stage of change and her goal is to continue with weight loss efforts. I recommend Katelyn Walsh begin the structured treatment plan as follows:  She has agreed to the Category 2 Plan.  Exercise goals: No exercise has been prescribed for now, while we concentrate on nutritional changes.   Behavioral modification strategies: ways to avoid night time  snacking, better snacking choices and travel eating strategies.  She was informed of the importance of frequent follow-up visits to maximize her success with intensive lifestyle modifications for her multiple health conditions. She was informed we would discuss her lab results at her next visit unless there is a critical issue that needs to be addressed sooner. Katelyn Walsh agreed to keep her next visit at the agreed upon time to discuss these results.  Objective:   Blood pressure 119/76, pulse 85, temperature 98.6 F (37 C), temperature source Oral, height 5\' 4"  (1.626 m), weight 224 lb (101.6 kg), SpO2 97 %. Body mass index is 38.45 kg/m.  EKG: Normal sinus rhythm, tachycardia rate 104 BPM.  Indirect Calorimeter completed today shows a VO2 of 285 and a REE of 1985.  Her calculated basal metabolic rate is [id number] thus her basal metabolic rate is better than expected.  General: Cooperative, alert, well developed, in no acute distress. HEENT: Conjunctivae and lids unremarkable. Cardiovascular: Regular rhythm.  Lungs: Normal work of breathing. Neurologic: No focal deficits.   Lab Results  Component Value Date   CREATININE 1.09 (H) 04/27/2019   BUN 17 04/27/2019   NA 139 04/27/2019   K 4.1 04/27/2019   CL 107  04/27/2019   CO2 21 (L) 04/27/2019   Lab Results  Component Value Date   ALT 23 07/31/2014   AST 22 07/31/2014   ALKPHOS 74 07/31/2014   BILITOT 0.66 07/31/2014   No results found for: HGBA1C No results found for: INSULIN Lab Results  Component Value Date   TSH 1.299 07/31/2014   No results found for: CHOL, HDL, LDLCALC, LDLDIRECT, TRIG, CHOLHDL Lab Results  Component Value Date   WBC 6.0 04/26/2019   HGB 12.6 04/26/2019   HCT 40.5 04/26/2019   MCV 96.2 04/26/2019   PLT 171 04/26/2019   No results found for: IRON, TIBC, FERRITIN Obesity Behavioral Intervention Visit Documentation for Insurance:   Approximately 15 minutes were spent on the discussion below.  ASK: We  discussed the diagnosis of obesity with Katelyn Walsh today and Chrystin agreed to give [place] permission to discuss obesity behavioral modification therapy today.  ASSESS: Lanis has the diagnosis of obesity and her BMI today is 38.43. Latrice is in the action stage of change.   ADVISE: Morea was educated on the multiple health risks of obesity as well as the benefit of weight loss to improve her health. She was advised of the need for long term treatment and the importance of lifestyle modifications to improve her current health and to decrease her risk of future health problems.  AGREE: Multiple dietary modification options and treatment options were discussed and Shaquesha agreed to follow the recommendations documented in the above note.  ARRANGE: Evian was educated on the importance of frequent visits to treat obesity as outlined per CMS and USPSTF guidelines and agreed to schedule her next follow up appointment today.  Attestation Statements:   Reviewed by clinician on day of visit: allergies, medications, problem list, medical history, surgical history, family history, social history, and previous encounter notes.   I, [name] Dredge, am acting as transcriptionist for Dennard Nip, MD.  I have reviewed the above documentation for accuracy and completeness, and I agree with the above. - Dennard Nip, MD

## 2019-05-12 DIAGNOSIS — M7061 Trochanteric bursitis, right hip: Secondary | ICD-10-CM | POA: Diagnosis not present

## 2019-05-12 DIAGNOSIS — M25551 Pain in right hip: Secondary | ICD-10-CM | POA: Diagnosis not present

## 2019-05-12 LAB — INSULIN, RANDOM: INSULIN: 20.3 u[IU]/mL (ref 2.6–24.9)

## 2019-05-12 LAB — COMPREHENSIVE METABOLIC PANEL
ALT: 19 IU/L (ref 0–32)
AST: 20 IU/L (ref 0–40)
Albumin/Globulin Ratio: 1.9 (ref 1.2–2.2)
Albumin: 4.7 g/dL (ref 3.8–4.8)
Alkaline Phosphatase: 87 IU/L (ref 39–117)
BUN/Creatinine Ratio: 15 (ref 12–28)
BUN: 15 mg/dL (ref 8–27)
Bilirubin Total: 0.6 mg/dL (ref 0.0–1.2)
CO2: 19 mmol/L — ABNORMAL LOW (ref 20–29)
Calcium: 9.3 mg/dL (ref 8.7–10.3)
Chloride: 109 mmol/L — ABNORMAL HIGH (ref 96–106)
Creatinine, Ser: 0.97 mg/dL (ref 0.57–1.00)
GFR calc Af Amer: 69 mL/min/{1.73_m2} (ref >59)
GFR calc non Af Amer: 60 mL/min/{1.73_m2} (ref >59)
Globulin, Total: 2.5 g/dL (ref 1.5–4.5)
Glucose: 140 mg/dL — ABNORMAL HIGH (ref 65–99)
Potassium: 4.4 mmol/L (ref 3.5–5.2)
Sodium: 144 mmol/L (ref 134–144)
Total Protein: 7.2 g/dL (ref 6.0–8.5)

## 2019-05-12 LAB — VITAMIN B12: Vitamin B-12: 353 pg/mL (ref 232–1245)

## 2019-05-15 DIAGNOSIS — J3081 Allergic rhinitis due to animal (cat) (dog) hair and dander: Secondary | ICD-10-CM | POA: Diagnosis not present

## 2019-05-15 DIAGNOSIS — J3089 Other allergic rhinitis: Secondary | ICD-10-CM | POA: Diagnosis not present

## 2019-05-15 DIAGNOSIS — J301 Allergic rhinitis due to pollen: Secondary | ICD-10-CM | POA: Diagnosis not present

## 2019-05-15 DIAGNOSIS — L299 Pruritus, unspecified: Secondary | ICD-10-CM | POA: Diagnosis not present

## 2019-05-24 ENCOUNTER — Ambulatory Visit: Payer: Medicare Other | Attending: Internal Medicine

## 2019-05-24 DIAGNOSIS — Z23 Encounter for immunization: Secondary | ICD-10-CM

## 2019-05-24 NOTE — Progress Notes (Signed)
   Covid-19 Vaccination Clinic  Name:  Katelyn Walsh    MRN: [id number] DOB: 12-22-1950  05/24/2019  [name] was observed post Covid-19 immunization for 15 minutes without incident. She was provided with Vaccine Information Sheet and instruction to access the V-Safe system.   [name] was instructed to call 911 with any severe reactions post vaccine: [name] Kitchen Difficulty breathing  . Swelling of face and throat  . A fast heartbeat  . A bad rash all over body  . Dizziness and weakness   Immunizations Administered    Name Date Dose VIS Date Route   Pfizer COVID-19 Vaccine 05/24/2019  3:38 PM 0.3 mL 02/03/2019 Intramuscular   Manufacturer: [place]   Lot: [id number]   [place]: [id number]

## 2019-05-25 ENCOUNTER — Ambulatory Visit (INDEPENDENT_AMBULATORY_CARE_PROVIDER_SITE_OTHER): Payer: Medicare Other | Admitting: Family Medicine

## 2019-05-25 ENCOUNTER — Encounter (INDEPENDENT_AMBULATORY_CARE_PROVIDER_SITE_OTHER): Payer: Self-pay | Admitting: Family Medicine

## 2019-05-25 ENCOUNTER — Other Ambulatory Visit: Payer: Self-pay

## 2019-05-25 VITALS — BP 135/75 | HR 80 | Temp 98.8°F | Ht 64.0 in | Wt 224.0 lb

## 2019-05-25 DIAGNOSIS — R7303 Prediabetes: Secondary | ICD-10-CM

## 2019-05-25 DIAGNOSIS — Z6838 Body mass index (BMI) 38.0-38.9, adult: Secondary | ICD-10-CM | POA: Diagnosis not present

## 2019-05-25 DIAGNOSIS — E559 Vitamin D deficiency, unspecified: Secondary | ICD-10-CM

## 2019-05-29 NOTE — Progress Notes (Signed)
Chief Complaint:   OBESITY Katelyn Walsh is here to discuss her progress with her obesity treatment plan along with follow-up of her obesity related diagnoses. Katelyn Walsh is on the Category 2 Plan and states she is following her eating plan approximately 75-80% of the time. Katelyn Walsh states she is being more mindful of her steps.  Today's visit was #: 2 Starting weight: 224 lbs Starting date: 05/11/2019 Today's weight: 224 lbs Today's date: 05/25/2019 Total lbs lost to date: 0 Total lbs lost since last in-office visit: 0  Interim History: Katelyn Walsh had followed her prescribed meal plan very closely the first week. She had a very difficult time following her eating qualities whiles traveling in [place] for her 69th birthday.  Subjective:   1. Pre-diabetes Katelyn Walsh's A1c level on 05/05/2019 was 6.1 and insulin level on 05/11/2019 was 20.3. She has never been on medication for BG control.  2. Vitamin D deficiency Katelyn Walsh is currently on OTC Vit D 5,000 q daily.  Assessment/Plan:   1. Pre-diabetes Katelyn Walsh will continue on Category 3 meal plan, and will continue to work on weight loss, exercise, and decreasing simple carbohydrates to help decrease the risk of diabetes. We will recheck labs in 3 months.  2. Vitamin D deficiency Low Vitamin D level contributes to fatigue and are associated with obesity, breast, and colon cancer. Katelyn Walsh will follow-up for routine testing of Vitamin D, at least 2-3 times per year to avoid over-replacement. We will recheck labs in 3 months.  3. Class 2 severe obesity with serious comorbidity and body mass index (BMI) of 38.0 to 38.9 in adult, unspecified obesity type (HCC) Katelyn Walsh is currently in the action stage of change. As such, her goal is to continue with weight loss efforts. She has agreed to the Category 2 Plan.   Katelyn Walsh is ensure to eat all the food on the meal plan. Okay to space out protein throughout the day.  Exercise goals: As is.  Behavioral modification  strategies: no skipping meals and travel eating strategies.  Katelyn Walsh has agreed to follow-up with our clinic in 2 weeks with Dr. Owens Shark. She was informed of the importance of frequent follow-up visits to maximize her success with intensive lifestyle modifications for her multiple health conditions.   Objective:   Blood pressure 135/75, pulse 80, temperature 98.8 F (37.1 C), temperature source Oral, height 5\' 4"  (1.626 m), weight 224 lb (101.6 kg), SpO2 96 %. Body mass index is 38.45 kg/m.  General: Cooperative, alert, well developed, in no acute distress. HEENT: Conjunctivae and lids unremarkable. Cardiovascular: Regular rhythm.  Lungs: Normal work of breathing. Neurologic: No focal deficits.   Lab Results  Component Value Date   CREATININE 0.97 05/11/2019   BUN 15 05/11/2019   NA 144 05/11/2019   K 4.4 05/11/2019   CL 109 (H) 05/11/2019   CO2 19 (L) 05/11/2019   Lab Results  Component Value Date   ALT 19 05/11/2019   AST 20 05/11/2019   ALKPHOS 87 05/11/2019   BILITOT 0.6 05/11/2019   No results found for: HGBA1C Lab Results  Component Value Date   INSULIN 20.3 05/11/2019   Lab Results  Component Value Date   TSH 1.299 07/31/2014   No results found for: CHOL, HDL, LDLCALC, LDLDIRECT, TRIG, CHOLHDL Lab Results  Component Value Date   WBC 6.0 04/26/2019   HGB 12.6 04/26/2019   HCT 40.5 04/26/2019   MCV 96.2 04/26/2019   PLT 171 04/26/2019   No results found for: IRON,  TIBC, FERRITIN  Attestation Statements:   Reviewed by clinician on day of visit: allergies, medications, problem list, medical history, surgical history, family history, social history, and previous encounter notes.  Time spent on visit including pre-visit chart review and post-visit care and charting was 45 minutes.    I, [name] Dredge, am acting as transcriptionist for Dennard Nip, MD.  I have reviewed the above documentation for accuracy and completeness, and I agree with the above. -   Dennard Nip, MD

## 2019-05-30 DIAGNOSIS — H5203 Hypermetropia, bilateral: Secondary | ICD-10-CM | POA: Diagnosis not present

## 2019-05-30 DIAGNOSIS — H2513 Age-related nuclear cataract, bilateral: Secondary | ICD-10-CM | POA: Diagnosis not present

## 2019-05-30 DIAGNOSIS — H524 Presbyopia: Secondary | ICD-10-CM | POA: Diagnosis not present

## 2019-05-31 DIAGNOSIS — M25551 Pain in right hip: Secondary | ICD-10-CM | POA: Diagnosis not present

## 2019-05-31 DIAGNOSIS — M7061 Trochanteric bursitis, right hip: Secondary | ICD-10-CM | POA: Diagnosis not present

## 2019-06-02 DIAGNOSIS — M25551 Pain in right hip: Secondary | ICD-10-CM | POA: Diagnosis not present

## 2019-06-08 ENCOUNTER — Ambulatory Visit (INDEPENDENT_AMBULATORY_CARE_PROVIDER_SITE_OTHER): Payer: Medicare Other | Admitting: Bariatrics

## 2019-06-08 ENCOUNTER — Encounter (INDEPENDENT_AMBULATORY_CARE_PROVIDER_SITE_OTHER): Payer: Self-pay | Admitting: Bariatrics

## 2019-06-08 ENCOUNTER — Other Ambulatory Visit: Payer: Self-pay

## 2019-06-08 VITALS — BP 113/70 | HR 63 | Temp 97.9°F | Ht 64.0 in | Wt 216.0 lb

## 2019-06-08 DIAGNOSIS — E559 Vitamin D deficiency, unspecified: Secondary | ICD-10-CM | POA: Diagnosis not present

## 2019-06-08 DIAGNOSIS — Z6837 Body mass index (BMI) 37.0-37.9, adult: Secondary | ICD-10-CM | POA: Diagnosis not present

## 2019-06-08 DIAGNOSIS — I10 Essential (primary) hypertension: Secondary | ICD-10-CM | POA: Diagnosis not present

## 2019-06-08 DIAGNOSIS — E7849 Other hyperlipidemia: Secondary | ICD-10-CM

## 2019-06-12 NOTE — Progress Notes (Signed)
Chief Complaint:   OBESITY Remedy Katelyn Walsh is here to discuss her progress with her obesity treatment plan along with follow-up of her obesity related diagnoses. Katelyn Walsh is on the Category 2 Plan and states she is following her eating plan approximately 90% of the time. Katelyn Walsh states she is exercising 0 minutes 0 times per week.  Today's visit was #: 3 Starting weight: 224 lbs Starting date: 05/11/2019 Today's weight: 216 lbs Today's date: 06/08/2019 Total lbs lost to date: 8 Total lbs lost since last in-office visit: 8  Interim History: Katelyn Walsh is down 8 lbs today. She saw her family, had birthday celebrations, ate out and had [place] food. She notes she had a taco salad. She notes she has bought some microwave meals. She does not like plain yogurt and asks about other yogurt options. She notes sometimes dinner is an issue due to protein.  Subjective:   Vitamin D deficiency. Katelyn Walsh is on OTC Vitamin D3 5,000 IU daily. No nausea, vomiting, or muscle weakness.  Essential hypertension. Katelyn Walsh is on Cozaar 100 mg QAM. Blood pressure is controlled today. No chest pain.  BP Readings from Last 3 Encounters:  06/08/19 113/70  05/25/19 135/75  05/11/19 119/76   Lab Results  Component Value Date   CREATININE 0.97 05/11/2019   CREATININE 1.09 (H) 04/27/2019   CREATININE 1.02 (H) 04/26/2019   Other hyperlipidemia. Katelyn Walsh is on Crestor 20 mg daily. No myalgias.  No results found for: CHOL, HDL, LDLCALC, LDLDIRECT, TRIG, CHOLHDL Lab Results  Component Value Date   ALT 19 05/11/2019   AST 20 05/11/2019   ALKPHOS 87 05/11/2019   BILITOT 0.6 05/11/2019   The ASCVD Risk score Katelyn Bussing DC Jr., et al., 2013) failed to calculate for the following reasons:   Cannot find a previous HDL lab   Cannot find a previous total cholesterol lab  Assessment/Plan:   Vitamin D deficiency. Low Vitamin D level contributes to fatigue and are associated with obesity, breast, and colon cancer. She agrees to  continue to take Vitamin D and will follow-up for routine testing of Vitamin D, at least 2-3 times per year to avoid over-replacement.  Essential hypertension. Katelyn Walsh is working on healthy weight loss and exercise to improve blood pressure control. We will watch for signs of hypotension as she continues her lifestyle modifications. She will continue her medications as directed.  Other hyperlipidemia. Cardiovascular risk and specific lipid/LDL goals reviewed.  We discussed several lifestyle modifications today and Katelyn Walsh will continue to work on diet, exercise and weight loss efforts. Orders and follow up as documented in patient record. Katelyn Walsh will continue her medication as directed.  Counseling Intensive lifestyle modifications are the first line treatment for this issue. . Dietary changes: Increase soluble fiber. Decrease simple carbohydrates. . Exercise changes: Moderate to vigorous-intensity aerobic activity 150 minutes per week if tolerated. . Lipid-lowering medications: see documented in medical record.  Class 2 severe obesity with serious comorbidity and body mass index (BMI) of 37.0 to 37.9 in adult, unspecified obesity type (Katelyn Walsh).  Katelyn Walsh is currently in the action stage of change. As such, her goal is to continue with weight loss efforts. She has agreed to the Category 2 Plan.   She will work on meal planning.  Handouts were given on Eating Out, Category 2 meal plan, and Smart Fruit.  She was given [employer] yogurt option. Skinny Girl salad dressing options were discussed.  Exercise goals: No exercise has been prescribed at this time.  Behavioral modification  strategies: increasing lean protein intake, decreasing simple carbohydrates, increasing vegetables, increasing water intake, meal planning and cooking strategies and planning for success.  Katelyn Walsh has agreed to follow-up with our clinic in 3-4 weeks. She was informed of the importance of frequent follow-up visits to maximize her  success with intensive lifestyle modifications for her multiple health conditions.   Objective:   Blood pressure 113/70, pulse 63, temperature 97.9 F (36.6 C), height 5\' 4"  (1.626 m), weight 216 lb (98 kg), SpO2 98 %. Body mass index is 37.08 kg/m.  General: Cooperative, alert, well developed, in no acute distress. HEENT: Conjunctivae and lids unremarkable. Cardiovascular: Regular rhythm.  Lungs: Normal work of breathing. Neurologic: No focal deficits.   Lab Results  Component Value Date   CREATININE 0.97 05/11/2019   BUN 15 05/11/2019   NA 144 05/11/2019   K 4.4 05/11/2019   CL 109 (H) 05/11/2019   CO2 19 (L) 05/11/2019   Lab Results  Component Value Date   ALT 19 05/11/2019   AST 20 05/11/2019   ALKPHOS 87 05/11/2019   BILITOT 0.6 05/11/2019   No results found for: HGBA1C Lab Results  Component Value Date   INSULIN 20.3 05/11/2019   Lab Results  Component Value Date   TSH 1.299 07/31/2014   No results found for: CHOL, HDL, LDLCALC, LDLDIRECT, TRIG, CHOLHDL Lab Results  Component Value Date   WBC 6.0 04/26/2019   HGB 12.6 04/26/2019   HCT 40.5 04/26/2019   MCV 96.2 04/26/2019   PLT 171 04/26/2019   No results found for: IRON, TIBC, FERRITIN  Obesity Behavioral Intervention Documentation for Insurance:   Approximately 15 minutes were spent on the discussion below.  ASK: We discussed the diagnosis of obesity with Katelyn Walsh today and Katelyn Walsh agreed to give [place] permission to discuss obesity behavioral modification therapy today.  ASSESS: Katelyn Walsh has the diagnosis of obesity and her BMI today is 37.2. Katelyn Walsh is in the action stage of change.   ADVISE: Katelyn Walsh was educated on the multiple health risks of obesity as well as the benefit of weight loss to improve her health. She was advised of the need for long term treatment and the importance of lifestyle modifications to improve her current health and to decrease her risk of future health problems.  AGREE: Multiple  dietary modification options and treatment options were discussed and Sherilee agreed to follow the recommendations documented in the above note.  ARRANGE: Thanna was educated on the importance of frequent visits to treat obesity as outlined per CMS and USPSTF guidelines and agreed to schedule her next follow up appointment today.  Attestation Statements:   Reviewed by clinician on day of visit: allergies, medications, problem list, medical history, surgical history, family history, social history, and previous encounter notes.  Time spent on visit including pre-visit chart review and post-visit charting and care was 20 minutes.   Migdalia Dk, am acting as Location manager for [employer], DO   I have reviewed the above documentation for accuracy and completeness, and I agree with the above. Jearld Lesch, DO

## 2019-06-13 ENCOUNTER — Encounter (INDEPENDENT_AMBULATORY_CARE_PROVIDER_SITE_OTHER): Payer: Self-pay | Admitting: Bariatrics

## 2019-06-29 ENCOUNTER — Other Ambulatory Visit: Payer: Self-pay

## 2019-06-29 ENCOUNTER — Ambulatory Visit (INDEPENDENT_AMBULATORY_CARE_PROVIDER_SITE_OTHER): Payer: Medicare Other | Admitting: Family Medicine

## 2019-06-29 VITALS — BP 112/69 | HR 55 | Temp 97.8°F | Ht 64.0 in | Wt 212.0 lb

## 2019-06-29 DIAGNOSIS — Z6836 Body mass index (BMI) 36.0-36.9, adult: Secondary | ICD-10-CM | POA: Diagnosis not present

## 2019-06-29 DIAGNOSIS — R7303 Prediabetes: Secondary | ICD-10-CM | POA: Diagnosis not present

## 2019-06-29 NOTE — Progress Notes (Signed)
     Chief Complaint:   OBESITY Katelyn Walsh is here to discuss her progress with her obesity treatment plan along with follow-up of her obesity related diagnoses. Katelyn Walsh is on the Category 2 Plan and states she is following her eating plan approximately 80-90% of the time. Katelyn Walsh states she is doing 0 minutes 0 times per week.  Today's visit was #: 4 Starting weight: 224 lbs Starting date: 05/11/2019 Today's weight: 212 lbs Today's date: 06/29/2019 Total lbs lost to date: 12 Total lbs lost since last in-office visit: 4  Interim History: Katelyn Walsh continues to do very well with her weight loss efforts. She has lost another 4 lbs in the last 3 weeks. She has been traveling but has still done very well.  Subjective:   1. Pre-diabetes Katelyn Walsh working on diet and weight loss, and she is doing well. She denies hypoglycemia, and notes decreased polyphagia.  Assessment/Plan:   1. Pre-diabetes Katelyn Walsh will continue to work on weight loss, diet, exercise, and decreasing simple carbohydrates to help decrease the risk of diabetes. We will recheck labs in 1 month.   2. Class 2 severe obesity with serious comorbidity and body mass index (BMI) of 36.0 to 36.9 in adult, unspecified obesity type (HCC) Katelyn Walsh is currently in the action stage of change. As such, her goal is to continue with weight loss efforts. She has agreed to the Category 2 Plan.   Behavioral modification strategies: meal planning and cooking strategies.  Katelyn Walsh has agreed to follow-up with our clinic in 2 weeks. She was informed of the importance of frequent follow-up visits to maximize her success with intensive lifestyle modifications for her multiple health conditions.   Objective:   Blood pressure 112/69, pulse (!) 55, temperature 97.8 F (36.6 C), temperature source Oral, height 5\' 4"  (1.626 m), weight 212 lb (96.2 kg), SpO2 100 %. Body mass index is 36.39 kg/m.  General: Cooperative, alert, well developed, in no acute distress. HEENT:  Conjunctivae and lids unremarkable. Cardiovascular: Regular rhythm.  Lungs: Normal work of breathing. Neurologic: No focal deficits.   Lab Results  Component Value Date   CREATININE 0.97 05/11/2019   BUN 15 05/11/2019   NA 144 05/11/2019   K 4.4 05/11/2019   CL 109 (H) 05/11/2019   CO2 19 (L) 05/11/2019   Lab Results  Component Value Date   ALT 19 05/11/2019   AST 20 05/11/2019   ALKPHOS 87 05/11/2019   BILITOT 0.6 05/11/2019   No results found for: HGBA1C Lab Results  Component Value Date   INSULIN 20.3 05/11/2019   Lab Results  Component Value Date   TSH 1.299 07/31/2014   No results found for: CHOL, HDL, LDLCALC, LDLDIRECT, TRIG, CHOLHDL Lab Results  Component Value Date   WBC 6.0 04/26/2019   HGB 12.6 04/26/2019   HCT 40.5 04/26/2019   MCV 96.2 04/26/2019   PLT 171 04/26/2019   No results found for: IRON, TIBC, FERRITIN  Attestation Statements:   Reviewed by clinician on day of visit: allergies, medications, problem list, medical history, surgical history, family history, social history, and previous encounter notes.  Time spent on visit including pre-visit chart review and post-visit care and charting was 30 minutes.    I, [name] Dredge, am acting as transcriptionist for Dennard Nip, MD.  I have reviewed the above documentation for accuracy and completeness, and I agree with the above. -  Dennard Nip, MD

## 2019-07-17 ENCOUNTER — Other Ambulatory Visit: Payer: Self-pay

## 2019-07-17 ENCOUNTER — Ambulatory Visit (INDEPENDENT_AMBULATORY_CARE_PROVIDER_SITE_OTHER): Payer: Medicare Other | Admitting: Family Medicine

## 2019-07-17 ENCOUNTER — Encounter (INDEPENDENT_AMBULATORY_CARE_PROVIDER_SITE_OTHER): Payer: Self-pay | Admitting: Family Medicine

## 2019-07-17 VITALS — BP 132/70 | HR 69 | Temp 97.9°F | Ht 64.0 in | Wt 211.0 lb

## 2019-07-17 DIAGNOSIS — I10 Essential (primary) hypertension: Secondary | ICD-10-CM

## 2019-07-17 DIAGNOSIS — Z6836 Body mass index (BMI) 36.0-36.9, adult: Secondary | ICD-10-CM

## 2019-07-17 DIAGNOSIS — E538 Deficiency of other specified B group vitamins: Secondary | ICD-10-CM | POA: Diagnosis not present

## 2019-07-18 NOTE — Progress Notes (Signed)
Chief Complaint:   OBESITY Katelyn Walsh is here to discuss her progress with her obesity treatment plan along with follow-up of her obesity related diagnoses. Katelyn Walsh is on the Category 2 Plan and states she is following her eating plan approximately 75-80% of the time. Katelyn Walsh states she is doing 0 minutes 0 times per week.  Today's visit was #: 5 Starting weight: 224 lbs Starting date: 05/11/2019 Today's weight: 211 lbs Today's date: 07/17/2019 Total lbs lost to date: 13 Total lbs lost since last in-office visit: 1  Interim History: Katelyn Walsh has been snacking on nuts and crackers, and she has not been measuring out her serving sizes. She is traveling to [place], [place] at the end of the week. We discussed travel eating strategies at length.  Subjective:   1. Vitamin B 12 deficiency Katelyn Walsh's B12 level on 05/11/2019 was 353. She is not taking any B12 supplementation.  2. Essential hypertension Katelyn Walsh's blood pressure is well controlled at her office visit today. She has been steadfastly working on reducing her sodium in her diet. She is on losartan 100 mg q daily and she denies chest pain or dyspnea with exertion.   Assessment/Plan:   1. Vitamin B 12 deficiency The diagnosis was reviewed with the patient. We will continue to monitor. Katelyn Walsh will continue her Category 2 meal plan, and will follow up as directed. Orders and follow up as documented in patient record.  2. Essential hypertension Katelyn Walsh will continue her current ARB, and will continue her Category 2 meal plan. She will continue working on healthy weight loss and exercise to improve blood pressure control. We will watch for signs of hypotension as she continues her lifestyle modifications.  3. Class 2 severe obesity with serious comorbidity and body mass index (BMI) of 36.0 to 36.9 in adult, unspecified obesity type (HCC) Katelyn Walsh is currently in the action stage of change. As such, her goal is to continue with weight loss efforts. She  has agreed to the Category 2 Plan.   Handout provided today: Additional Breakfast Options.  Exercise goals: No exercise has been prescribed at this time.  Behavioral modification strategies: increasing lean protein intake, decreasing simple carbohydrates, meal planning and cooking strategies, better snacking choices and travel eating strategies.  Katelyn Walsh has agreed to follow-up with our clinic in 2 weeks. She was informed of the importance of frequent follow-up visits to maximize her success with intensive lifestyle modifications for her multiple health conditions.   Objective:   Blood pressure 132/70, pulse 69, temperature 97.9 F (36.6 C), temperature source Oral, height 5\' 4"  (1.626 m), weight 211 lb (95.7 kg), SpO2 98 %. Body mass index is 36.22 kg/m.  General: Cooperative, alert, well developed, in no acute distress. HEENT: Conjunctivae and lids unremarkable. Cardiovascular: Regular rhythm.  Lungs: Normal work of breathing. Neurologic: No focal deficits.   Lab Results  Component Value Date   CREATININE 0.97 05/11/2019   BUN 15 05/11/2019   NA 144 05/11/2019   K 4.4 05/11/2019   CL 109 (H) 05/11/2019   CO2 19 (L) 05/11/2019   Lab Results  Component Value Date   ALT 19 05/11/2019   AST 20 05/11/2019   ALKPHOS 87 05/11/2019   BILITOT 0.6 05/11/2019   No results found for: HGBA1C Lab Results  Component Value Date   INSULIN 20.3 05/11/2019   Lab Results  Component Value Date   TSH 1.299 07/31/2014   No results found for: CHOL, HDL, LDLCALC, LDLDIRECT, TRIG, CHOLHDL Lab Results  Component Value Date   WBC 6.0 04/26/2019   HGB 12.6 04/26/2019   HCT 40.5 04/26/2019   MCV 96.2 04/26/2019   PLT 171 04/26/2019   No results found for: IRON, TIBC, FERRITIN  Attestation Statements:   Reviewed by clinician on day of visit: allergies, medications, problem list, medical history, surgical history, family history, social history, and previous encounter notes.  Time  spent on visit including pre-visit chart review and post-visit care and charting was 22 minutes.    I, [name] Dredge, am acting as transcriptionist for Dennard Nip, MD.  I have reviewed the above documentation for accuracy and completeness, and I agree with the above. -  Dennard Nip, MD

## 2019-07-25 ENCOUNTER — Encounter (INDEPENDENT_AMBULATORY_CARE_PROVIDER_SITE_OTHER): Payer: Self-pay

## 2019-08-01 ENCOUNTER — Other Ambulatory Visit: Payer: Self-pay

## 2019-08-01 ENCOUNTER — Encounter (INDEPENDENT_AMBULATORY_CARE_PROVIDER_SITE_OTHER): Payer: Self-pay | Admitting: Physician Assistant

## 2019-08-01 ENCOUNTER — Ambulatory Visit (INDEPENDENT_AMBULATORY_CARE_PROVIDER_SITE_OTHER): Payer: Medicare Other | Admitting: Physician Assistant

## 2019-08-01 VITALS — BP 119/71 | HR 56 | Temp 98.1°F | Ht 64.0 in | Wt 207.0 lb

## 2019-08-01 DIAGNOSIS — Z6835 Body mass index (BMI) 35.0-35.9, adult: Secondary | ICD-10-CM | POA: Diagnosis not present

## 2019-08-01 DIAGNOSIS — E559 Vitamin D deficiency, unspecified: Secondary | ICD-10-CM | POA: Diagnosis not present

## 2019-08-01 DIAGNOSIS — R7303 Prediabetes: Secondary | ICD-10-CM

## 2019-08-01 NOTE — Progress Notes (Signed)
Chief Complaint:   OBESITY Katelyn Walsh is here to discuss her progress with her obesity treatment plan along with follow-up of her obesity related diagnoses. Katelyn Walsh is on the Category 2 Plan and states she is following her eating plan approximately 80% of the time. Katelyn Walsh states she is exercising 0 minutes 0 times per week.  Today's visit was #: 6 Starting weight: 224 lbs Starting date: 05/11/2019 Today's weight: 207 lbs Today's date: 08/01/2019 Total lbs lost to date: 17 Total lbs lost since last in-office visit: 4  Interim History: Katelyn Walsh states that she just returned from [place], [place] where she was mindful of her food choices. She reports that it  hasn'g been easy to stay on plan, especially because she does not like to cook and has been eating out for dinner.   Subjective:   Prediabetes. Katelyn Walsh has a diagnosis of prediabetes based on her elevated HgA1c and was informed this puts her at greater risk of developing diabetes. She continues to work on diet and exercise to decrease her risk of diabetes. She denies nausea or hypoglycemia. Katelyn Walsh is on no medication. No polyphagia.   No results found for: HGBA1C Lab Results  Component Value Date   INSULIN 20.3 05/11/2019   Vitamin D deficiency. Katelyn Walsh is on Vitamin D supplementation. No nausea, vomiting, or muscle weakness.   Assessment/Plan:   Prediabetes. Katelyn Walsh will continue to work on weight loss, exercise, and decreasing simple carbohydrates to help decrease the risk of diabetes.   Vitamin D deficiency. Low Vitamin D level contributes to fatigue and are associated with obesity, breast, and colon cancer. She agrees to continue to take Vitamin D and will follow-up for routine testing of Vitamin D, at least 2-3 times per year to avoid over-replacement.  Class 2 severe obesity with serious comorbidity and body mass index (BMI) of 35.0 to 35.9 in adult, unspecified obesity type ([place]).  Katelyn Walsh is currently in the action stage of change.  As such, her goal is to continue with weight loss efforts. She has agreed to the Category 2 Plan and will journal 400-550 calories and 35 grams of protein at supper.   Exercise goals: Older adults should follow the adult guidelines. When older adults cannot meet the adult guidelines, they should be as physically active as their abilities and conditions will allow.   Behavioral modification strategies: meal planning and cooking strategies.  Katelyn Walsh has agreed to follow-up with our clinic in 2 weeks. She was informed of the importance of frequent follow-up visits to maximize her success with intensive lifestyle modifications for her multiple health conditions.   Objective:   Blood pressure 119/71, pulse (!) 56, temperature 98.1 F (36.7 C), temperature source Oral, height 5\' 4"  (1.626 m), weight 207 lb (93.9 kg), SpO2 98 %. Body mass index is 35.53 kg/m.  General: Cooperative, alert, well developed, in no acute distress. HEENT: Conjunctivae and lids unremarkable. Cardiovascular: Regular rhythm.  Lungs: Normal work of breathing. Neurologic: No focal deficits.   Lab Results  Component Value Date   CREATININE 0.97 05/11/2019   BUN 15 05/11/2019   NA 144 05/11/2019   K 4.4 05/11/2019   CL 109 (H) 05/11/2019   CO2 19 (L) 05/11/2019   Lab Results  Component Value Date   ALT 19 05/11/2019   AST 20 05/11/2019   ALKPHOS 87 05/11/2019   BILITOT 0.6 05/11/2019   No results found for: HGBA1C Lab Results  Component Value Date   INSULIN 20.3 05/11/2019  Lab Results  Component Value Date   TSH 1.299 07/31/2014   No results found for: CHOL, HDL, LDLCALC, LDLDIRECT, TRIG, CHOLHDL Lab Results  Component Value Date   WBC 6.0 04/26/2019   HGB 12.6 04/26/2019   HCT 40.5 04/26/2019   MCV 96.2 04/26/2019   PLT 171 04/26/2019   No results found for: IRON, TIBC, FERRITIN  Obesity Behavioral Intervention Documentation for Insurance:   Approximately 15 minutes were spent on the  discussion below.  ASK: We discussed the diagnosis of obesity with Katelyn Walsh today and Katelyn Walsh agreed to give Katelyn Walsh permission to discuss obesity behavioral modification therapy today.  ASSESS: Katelyn Walsh has the diagnosis of obesity and her BMI today is 35.6. [name] is in the action stage of change.   ADVISE: [place] was educated on the multiple health risks of obesity as well as the benefit of weight loss to improve her health. She was advised of the need for long term treatment and the importance of lifestyle modifications to improve her current health and to decrease her risk of future health problems.  AGREE: Multiple dietary modification options and treatment options were discussed and Katelyn Walsh agreed to follow the recommendations documented in the above note.  ARRANGE: Katelyn Walsh was educated on the importance of frequent visits to treat obesity as outlined per CMS and USPSTF guidelines and agreed to schedule her next follow up appointment today.  Attestation Statements:   Reviewed by clinician on day of visit: allergies, medications, problem list, medical history, surgical history, family history, social history, and previous encounter notes.  IMichaelene Song, am acting as transcriptionist for Katelyn Potash, PA-C   I have reviewed the above documentation for accuracy and completeness, and I agree with the above. Katelyn Potash, PA-C

## 2019-08-16 ENCOUNTER — Other Ambulatory Visit: Payer: Self-pay

## 2019-08-16 ENCOUNTER — Ambulatory Visit (INDEPENDENT_AMBULATORY_CARE_PROVIDER_SITE_OTHER): Payer: Medicare Other | Admitting: Physician Assistant

## 2019-08-16 ENCOUNTER — Encounter (INDEPENDENT_AMBULATORY_CARE_PROVIDER_SITE_OTHER): Payer: Self-pay | Admitting: Physician Assistant

## 2019-08-16 VITALS — BP 115/76 | HR 64 | Temp 98.1°F | Ht 64.0 in | Wt 206.0 lb

## 2019-08-16 DIAGNOSIS — E559 Vitamin D deficiency, unspecified: Secondary | ICD-10-CM | POA: Diagnosis not present

## 2019-08-16 DIAGNOSIS — Z6835 Body mass index (BMI) 35.0-35.9, adult: Secondary | ICD-10-CM | POA: Diagnosis not present

## 2019-08-16 DIAGNOSIS — E7849 Other hyperlipidemia: Secondary | ICD-10-CM | POA: Diagnosis not present

## 2019-08-17 NOTE — Progress Notes (Signed)
Chief Complaint:   OBESITY Katelyn Walsh is here to discuss her progress with her obesity treatment plan along with follow-up of her obesity related diagnoses. Katelyn Walsh is on the Category 2 Plan and states she is following her eating plan approximately 60% of the time. Katelyn Walsh states she is exercising for 0 minutes 0 times per week.  Today's visit was #: 7 Starting weight: 224 lbs Starting date: 05/11/2019 Today's weight: 206 lbs Today's date: 08/16/2019 Total lbs lost to date: 18 lbs Total lbs lost since last in-office visit: 1 lb  Interim History: Katelyn Walsh states that her mother recently passed and she has been doing a lot of comfort eating.  Subjective:   1. Vitamin D deficiency She is currently taking OTC vitamin D 5000 IU each day. She denies nausea, vomiting or muscle weakness.  2. Other hyperlipidemia Walida has hyperlipidemia and has been trying to improve her cholesterol levels with intensive lifestyle modification including a low saturated fat diet, exercise and weight loss. She denies any chest pain, claudication or myalgias.  She is taking Crestor.  Lab Results  Component Value Date   ALT 19 05/11/2019   AST 20 05/11/2019   ALKPHOS 87 05/11/2019   BILITOT 0.6 05/11/2019   Assessment/Plan:   1. Vitamin D deficiency Low Vitamin D level contributes to fatigue and are associated with obesity, breast, and colon cancer. She agrees to continue to take OTC Vitamin D @5 ,000 IU daily and will follow-up for routine testing of Vitamin D, at least 2-3 times per year to avoid over-replacement.  2. Other hyperlipidemia Cardiovascular risk and specific lipid/LDL goals reviewed.  We discussed several lifestyle modifications today and Katelyn Walsh will continue to work on diet, exercise and weight loss efforts. Orders and follow up as documented in patient record.   Counseling Intensive lifestyle modifications are the first line treatment for this issue. . Dietary changes: Increase soluble fiber.  Decrease simple carbohydrates. . Exercise changes: Moderate to vigorous-intensity aerobic activity 150 minutes per week if tolerated. . Lipid-lowering medications: see documented in medical record.  3. Class 2 severe obesity with serious comorbidity and body mass index (BMI) of 35.0 to 35.9 in adult, unspecified obesity type (HCC) Katelyn Walsh is currently in the action stage of change. As such, her goal is to continue with weight loss efforts. She has agreed to the Category 2 Plan.   Exercise goals: Older adults should follow the adult guidelines. When older adults cannot meet the adult guidelines, they should be as physically active as their abilities and conditions will allow.  Older adults should do exercises that maintain or improve balance if they are at risk of falling.   Behavioral modification strategies: avoiding temptations and planning for success.  Katelyn Walsh has agreed to follow-up with our clinic in 2 weeks. She was informed of the importance of frequent follow-up visits to maximize her success with intensive lifestyle modifications for her multiple health conditions.   Objective:   Blood pressure 115/76, pulse 64, temperature 98.1 F (36.7 C), temperature source Oral, height 5\' 4"  (1.626 m), weight 206 lb (93.4 kg), SpO2 95 %. Body mass index is 35.36 kg/m.  General: Cooperative, alert, well developed, in no acute distress. HEENT: Conjunctivae and lids unremarkable. Cardiovascular: Regular rhythm.  Lungs: Normal work of breathing. Neurologic: No focal deficits.   Lab Results  Component Value Date   CREATININE 0.97 05/11/2019   BUN 15 05/11/2019   NA 144 05/11/2019   K 4.4 05/11/2019   CL 109 (H) 05/11/2019  CO2 19 (L) 05/11/2019   Lab Results  Component Value Date   ALT 19 05/11/2019   AST 20 05/11/2019   ALKPHOS 87 05/11/2019   BILITOT 0.6 05/11/2019   Lab Results  Component Value Date   INSULIN 20.3 05/11/2019   Lab Results  Component Value Date   TSH 1.299  07/31/2014   Lab Results  Component Value Date   WBC 6.0 04/26/2019   HGB 12.6 04/26/2019   HCT 40.5 04/26/2019   MCV 96.2 04/26/2019   PLT 171 04/26/2019   Obesity Behavioral Intervention Documentation for Insurance:   Approximately 15 minutes were spent on the discussion below.  ASK: We discussed the diagnosis of obesity with Katelyn Walsh today and [name] agreed to give [place] permission to discuss obesity behavioral modification therapy today.  ASSESS: Katelyn Walsh has the diagnosis of obesity and her BMI today is 35.4. Katelyn Walsh is in the action stage of change.   ADVISE: Katelyn Walsh was educated on the multiple health risks of obesity as well as the benefit of weight loss to improve her health. She was advised of the need for long term treatment and the importance of lifestyle modifications to improve her current health and to decrease her risk of future health problems.  AGREE: Multiple dietary modification options and treatment options were discussed and Katelyn Walsh agreed to follow the recommendations documented in the above note.  ARRANGE: Katelyn Walsh was educated on the importance of frequent visits to treat obesity as outlined per CMS and USPSTF guidelines and agreed to schedule her next follow up appointment today.  Attestation Statements:   Reviewed by clinician on day of visit: allergies, medications, problem list, medical history, surgical history, family history, social history, and previous encounter notes.  I, Water quality scientist, CMA, am acting as transcriptionist for Abby Potash, PA-C  I have reviewed the above documentation for accuracy and completeness, and I agree with the above. Abby Potash, PA-C

## 2019-08-24 ENCOUNTER — Encounter (INDEPENDENT_AMBULATORY_CARE_PROVIDER_SITE_OTHER): Payer: Self-pay

## 2019-08-29 ENCOUNTER — Other Ambulatory Visit: Payer: Self-pay

## 2019-08-29 ENCOUNTER — Encounter (INDEPENDENT_AMBULATORY_CARE_PROVIDER_SITE_OTHER): Payer: Self-pay | Admitting: Physician Assistant

## 2019-08-29 ENCOUNTER — Ambulatory Visit (INDEPENDENT_AMBULATORY_CARE_PROVIDER_SITE_OTHER): Payer: Medicare Other | Admitting: Physician Assistant

## 2019-08-29 VITALS — BP 115/68 | HR 71 | Temp 98.1°F | Ht 64.0 in | Wt 207.0 lb

## 2019-08-29 DIAGNOSIS — Z6835 Body mass index (BMI) 35.0-35.9, adult: Secondary | ICD-10-CM | POA: Diagnosis not present

## 2019-08-29 DIAGNOSIS — R7303 Prediabetes: Secondary | ICD-10-CM

## 2019-08-29 DIAGNOSIS — E7849 Other hyperlipidemia: Secondary | ICD-10-CM

## 2019-08-30 NOTE — Progress Notes (Signed)
Chief Complaint:   OBESITY Katelyn Walsh is here to discuss her progress with her obesity treatment plan along with follow-up of her obesity related diagnoses. Katelyn Walsh is on the Category 2 Plan and states she is following her eating plan approximately 60% of the time. Katelyn Walsh states she is exercising 0 minutes 0 times per week.  Today's visit was #: 8 Starting weight: 224 lbs Starting date: 05/11/2019 Today's weight: 207 lbs Today's date: 08/29/2019 Total lbs lost to date: 17 Total lbs lost since last in-office visit: 0  Interim History: Katelyn Walsh states that since her mom passed away last month she continues to do a lot of comfort eating. She is staying with friends who are cooking her dinner most nights.  Subjective:   Prediabetes. Katelyn Walsh has a diagnosis of prediabetes based on her elevated HgA1c and was informed this puts her at greater risk of developing diabetes. She continues to work on diet and exercise to decrease her risk of diabetes. She denies nausea or hypoglycemia. Katelyn Walsh is on no medication. No polyphagia.  No results found for: HGBA1C Lab Results  Component Value Date   INSULIN 20.3 05/11/2019   Other hyperlipidemia. [name] is on Crestor. No chest pain or myalgias.    No results found for: CHOL, HDL, LDLCALC, LDLDIRECT, TRIG, CHOLHDL Lab Results  Component Value Date   ALT 19 05/11/2019   AST 20 05/11/2019   ALKPHOS 87 05/11/2019   BILITOT 0.6 05/11/2019   The ASCVD Risk score Mikey Bussing DC Jr., et al., 2013) failed to calculate for the following reasons:   Cannot find a previous HDL lab   Cannot find a previous total cholesterol lab  Assessment/Plan:   Prediabetes. Katelyn Walsh will continue to work on weight loss, exercise, and decreasing simple carbohydrates to help decrease the risk of diabetes.   Other hyperlipidemia. Cardiovascular risk and specific lipid/LDL goals reviewed.  We discussed several lifestyle modifications today and Katelyn Walsh will continue to work on diet,  exercise and weight loss efforts. Orders and follow up as documented in patient record. Katelyn Walsh will continue her medication as directed.   Counseling Intensive lifestyle modifications are the first line treatment for this issue. . Dietary changes: Increase soluble fiber. Decrease simple carbohydrates. . Exercise changes: Moderate to vigorous-intensity aerobic activity 150 minutes per week if tolerated. . Lipid-lowering medications: see documented in medical record.  Class 2 severe obesity with serious comorbidity and body mass index (BMI) of 35.0 to 35.9 in adult, unspecified obesity type ([name]).  Katelyn Walsh is currently in the action stage of change. As such, her goal is to continue with weight loss efforts. She has agreed to the Category 2 Plan.   Exercise goals: Katelyn Walsh should follow the adult guidelines. When Katelyn Walsh cannot meet the adult guidelines, they should be as physically active as their abilities and conditions will allow.   Behavioral modification strategies: meal planning and cooking strategies and better snacking choices.  Katelyn Walsh has agreed to follow-up with our clinic in 2 weeks. She was informed of the importance of frequent follow-up visits to maximize her success with intensive lifestyle modifications for her multiple health conditions.   Objective:   Blood pressure 115/68, pulse 71, temperature 98.1 F (36.7 C), temperature source Oral, height 5\' 4"  (1.626 m), weight 207 lb (93.9 kg), SpO2 97 %. Body mass index is 35.53 kg/m.  General: Cooperative, alert, well developed, in no acute distress. HEENT: Conjunctivae and lids unremarkable. Cardiovascular: Regular rhythm.  Lungs: Normal work of breathing.  Neurologic: No focal deficits.   Lab Results  Component Value Date   CREATININE 0.97 05/11/2019   BUN 15 05/11/2019   NA 144 05/11/2019   K 4.4 05/11/2019   CL 109 (H) 05/11/2019   CO2 19 (L) 05/11/2019   Lab Results  Component Value Date   ALT 19 05/11/2019    AST 20 05/11/2019   ALKPHOS 87 05/11/2019   BILITOT 0.6 05/11/2019   No results found for: HGBA1C Lab Results  Component Value Date   INSULIN 20.3 05/11/2019   Lab Results  Component Value Date   TSH 1.299 07/31/2014   No results found for: CHOL, HDL, LDLCALC, LDLDIRECT, TRIG, CHOLHDL Lab Results  Component Value Date   WBC 6.0 04/26/2019   HGB 12.6 04/26/2019   HCT 40.5 04/26/2019   MCV 96.2 04/26/2019   PLT 171 04/26/2019   No results found for: IRON, TIBC, FERRITIN  Obesity Behavioral Intervention Documentation for Insurance:   Approximately 15 minutes were spent on the discussion below.  ASK: We discussed the diagnosis of obesity with Katelyn Walsh today and Katelyn Walsh agreed to give [place] permission to discuss obesity behavioral modification therapy today.  ASSESS: Katelyn Walsh has the diagnosis of obesity and her BMI today is 35.6. Katelyn Walsh is in the action stage of change.   ADVISE: Katelyn Walsh was educated on the multiple health risks of obesity as well as the benefit of weight loss to improve her health. She was advised of the need for long term treatment and the importance of lifestyle modifications to improve her current health and to decrease her risk of future health problems.  AGREE: Multiple dietary modification options and treatment options were discussed and Katelyn Walsh agreed to follow the recommendations documented in the above note.  ARRANGE: Katelyn Walsh was educated on the importance of frequent visits to treat obesity as outlined per CMS and USPSTF guidelines and agreed to schedule her next follow up appointment today.  Attestation Statements:   Reviewed by clinician on day of visit: allergies, medications, problem list, medical history, surgical history, family history, social history, and previous encounter notes.  IMichaelene Song, am acting as transcriptionist for Abby Potash, PA-C   I have reviewed the above documentation for accuracy and completeness, and I agree with the above.  Abby Potash, PA-C

## 2019-09-13 ENCOUNTER — Encounter (INDEPENDENT_AMBULATORY_CARE_PROVIDER_SITE_OTHER): Payer: Self-pay | Admitting: Adult Health

## 2019-09-13 ENCOUNTER — Other Ambulatory Visit: Payer: Self-pay

## 2019-09-13 ENCOUNTER — Ambulatory Visit (INDEPENDENT_AMBULATORY_CARE_PROVIDER_SITE_OTHER): Payer: Medicare Other | Admitting: Adult Health

## 2019-09-13 VITALS — BP 103/64 | HR 62 | Temp 97.7°F | Ht 64.0 in | Wt 201.0 lb

## 2019-09-13 DIAGNOSIS — R7303 Prediabetes: Secondary | ICD-10-CM

## 2019-09-13 DIAGNOSIS — E7849 Other hyperlipidemia: Secondary | ICD-10-CM

## 2019-09-13 DIAGNOSIS — I10 Essential (primary) hypertension: Secondary | ICD-10-CM | POA: Diagnosis not present

## 2019-09-13 DIAGNOSIS — E669 Obesity, unspecified: Secondary | ICD-10-CM | POA: Diagnosis not present

## 2019-09-13 DIAGNOSIS — Z6834 Body mass index (BMI) 34.0-34.9, adult: Secondary | ICD-10-CM

## 2019-09-13 DIAGNOSIS — Z6832 Body mass index (BMI) 32.0-32.9, adult: Secondary | ICD-10-CM | POA: Insufficient documentation

## 2019-09-13 NOTE — Progress Notes (Signed)
Chief Complaint:   OBESITY Katelyn Walsh is here to discuss her progress with her obesity treatment plan along with follow-up of her obesity related diagnoses. Katelyn Walsh is on the Category 2 Plan and states she is following her eating plan approximately 70-75% of the time. Katelyn Walsh states she is exercising 0 minutes 0 times per week.  Today's visit was #: 9 Starting weight: 224 lbs Starting date: 05/11/2019 Today's weight: 201 lbs Today's date: 09/13/2019 Total lbs lost to date: 23 Total lbs lost since last in-office visit: 6  Interim History: Katelyn Walsh has been staying with her sister-in-law since the death of her mother and her sister-in-law does the majority of the cooking. She has been making small adaptations when eating out and will focus on protein and vegetables.  Subjective:   Essential hypertension. Blood pressure has been trending down the last few office visits. Katelyn Walsh is on losartan 100 mg daily and denies symptoms of hypotension. She estimates to use Lasix 0-1 time per month to treat lower extremity edema.  BP Readings from Last 3 Encounters:  09/13/19 103/64  08/29/19 115/68  08/16/19 115/76   Lab Results  Component Value Date   CREATININE 0.97 05/11/2019   CREATININE 1.09 (H) 04/27/2019   CREATININE 1.02 (H) 04/26/2019   Other hyperlipidemia. Katelyn Walsh is on pravastatin 20 mg daily. She denies cardiac symptoms or myalgias.  No results found for: CHOL, HDL, LDLCALC, LDLDIRECT, TRIG, CHOLHDL Lab Results  Component Value Date   ALT 19 05/11/2019   AST 20 05/11/2019   ALKPHOS 87 05/11/2019   BILITOT 0.6 05/11/2019   The ASCVD Risk score Mikey Bussing DC Jr., et al., 2013) failed to calculate for the following reasons:   Cannot find a previous HDL lab   Cannot find a previous total cholesterol lab  Prediabetes. Katelyn Walsh has a diagnosis of prediabetes based on her elevated HgA1c and was informed this puts her at greater risk of developing diabetes. She continues to work on diet and  exercise to decrease her risk of diabetes. She denies nausea or hypoglycemia. No polyphagia.  No results found for: HGBA1C Lab Results  Component Value Date   INSULIN 20.3 05/11/2019   Assessment/Plan:   Essential hypertension. Katelyn Walsh is working on healthy weight loss and exercise to improve blood pressure control. We will watch for signs of hypotension as she continues her lifestyle modifications. Katelyn Walsh will check blood pressure and heart rate at home and will bring in her log to her follow-up appointment.  Other hyperlipidemia. Cardiovascular risk and specific lipid/LDL goals reviewed.  We discussed several lifestyle modifications today and Katelyn Walsh will continue to work on diet, exercise and weight loss efforts. Orders and follow up as documented in patient record. Katelyn Walsh will continue statin therapy as directed and remain well hydrated. She will continue to follow the Category 2 meal plan.  Counseling Intensive lifestyle modifications are the first line treatment for this issue. . Dietary changes: Increase soluble fiber. Decrease simple carbohydrates. . Exercise changes: Moderate to vigorous-intensity aerobic activity 150 minutes per week if tolerated. . Lipid-lowering medications: see documented in medical record.  Prediabetes. Katelyn Walsh will continue to work on weight loss, exercise, and decreasing simple carbohydrates to help decrease the risk of diabetes. She will continue to follow the Category 2 meal plan.  Labs will be checked at her next office visit.  Class 1 obesity with serious comorbidity and body mass index (BMI) of 34.0 to 34.9 in adult, unspecified obesity type.  Katelyn Walsh is currently in  the action stage of change. As such, her goal is to continue with weight loss efforts. She has agreed to the Category 2 Plan.   Handout was provided on Additional Breakfast Options.  Exercise goals: No exercise has been prescribed at this time.  Behavioral modification strategies: increasing lean  protein intake, decreasing simple carbohydrates, increasing vegetables, meal planning and cooking strategies, celebration eating strategies and planning for success.  Katelyn Walsh has agreed to follow-up with our clinic fasting in 2 weeks. She was informed of the importance of frequent follow-up visits to maximize her success with intensive lifestyle modifications for her multiple health conditions.   Objective:   Blood pressure 103/64, pulse 62, temperature 97.7 F (36.5 C), temperature source Oral, height 5\' 4"  (1.626 m), weight 201 lb (91.2 kg), SpO2 98 %. Body mass index is 34.5 kg/m.  General: Cooperative, alert, well developed, in no acute distress. HEENT: Conjunctivae and lids unremarkable. Cardiovascular: Regular rhythm.  Lungs: Normal work of breathing. Neurologic: No focal deficits.   Lab Results  Component Value Date   CREATININE 0.97 05/11/2019   BUN 15 05/11/2019   NA 144 05/11/2019   K 4.4 05/11/2019   CL 109 (H) 05/11/2019   CO2 19 (L) 05/11/2019   Lab Results  Component Value Date   ALT 19 05/11/2019   AST 20 05/11/2019   ALKPHOS 87 05/11/2019   BILITOT 0.6 05/11/2019   No results found for: HGBA1C Lab Results  Component Value Date   INSULIN 20.3 05/11/2019   Lab Results  Component Value Date   TSH 1.299 07/31/2014   No results found for: CHOL, HDL, LDLCALC, LDLDIRECT, TRIG, CHOLHDL Lab Results  Component Value Date   WBC 6.0 04/26/2019   HGB 12.6 04/26/2019   HCT 40.5 04/26/2019   MCV 96.2 04/26/2019   PLT 171 04/26/2019   No results found for: IRON, TIBC, FERRITIN  Attestation Statements:   Reviewed by clinician on day of visit: allergies, medications, problem list, medical history, surgical history, family history, social history, and previous encounter notes.  Time spent on visit including pre-visit chart review and post-visit charting and care was 29 minutes.   I, Katelyn Walsh, am acting as Location manager for [employer], NP-C   I have  reviewed the above documentation for accuracy and completeness, and I agree with the above. -  Katelyn Grandchild, NP

## 2019-10-02 ENCOUNTER — Other Ambulatory Visit (INDEPENDENT_AMBULATORY_CARE_PROVIDER_SITE_OTHER): Payer: Self-pay | Admitting: Adult Health

## 2019-10-02 ENCOUNTER — Encounter (INDEPENDENT_AMBULATORY_CARE_PROVIDER_SITE_OTHER): Payer: Self-pay | Admitting: Adult Health

## 2019-10-02 ENCOUNTER — Ambulatory Visit: Payer: Medicare Other | Admitting: Hematology and Oncology

## 2019-10-02 ENCOUNTER — Ambulatory Visit (INDEPENDENT_AMBULATORY_CARE_PROVIDER_SITE_OTHER): Payer: Medicare Other | Admitting: Adult Health

## 2019-10-02 ENCOUNTER — Other Ambulatory Visit: Payer: Self-pay

## 2019-10-02 VITALS — BP 118/77 | HR 69 | Temp 97.4°F | Ht 64.0 in | Wt 197.0 lb

## 2019-10-02 DIAGNOSIS — R739 Hyperglycemia, unspecified: Secondary | ICD-10-CM | POA: Diagnosis not present

## 2019-10-02 DIAGNOSIS — E669 Obesity, unspecified: Secondary | ICD-10-CM

## 2019-10-02 DIAGNOSIS — E559 Vitamin D deficiency, unspecified: Secondary | ICD-10-CM | POA: Diagnosis not present

## 2019-10-02 DIAGNOSIS — E7849 Other hyperlipidemia: Secondary | ICD-10-CM | POA: Diagnosis not present

## 2019-10-02 DIAGNOSIS — Z6833 Body mass index (BMI) 33.0-33.9, adult: Secondary | ICD-10-CM

## 2019-10-02 DIAGNOSIS — I10 Essential (primary) hypertension: Secondary | ICD-10-CM | POA: Diagnosis not present

## 2019-10-02 DIAGNOSIS — R5383 Other fatigue: Secondary | ICD-10-CM | POA: Diagnosis not present

## 2019-10-02 NOTE — Progress Notes (Signed)
Patient Care Team: Velna Hatchet, MD as PCP - General (Internal Medicine) Jerline Pain, MD as PCP - Cardiology (Cardiology) Lavone Orn, MD as Attending Physician (Internal Medicine) Harlin Heys, MD as Referring Physician (Family Medicine)  DIAGNOSIS:    ICD-10-CM   1. Primary cancer of upper inner quadrant of left female breast ([place])  C50.212     SUMMARY OF ONCOLOGIC HISTORY: Oncology History  Primary cancer of upper inner quadrant of left female breast ([place])  07/31/2011 Surgery   left breast lumpectomy: IDC grade to 1.8 cm, no LV are, with DCIS, 3 sentinel nodes negative, grade 2, ER 100%, PR 0%, HER-2 negative, Ki-67 9%, T1 cN0 M0 stage IA, Oncotype score 18, 11% ROR low risk   10/14/2011 - 11/17/2011 Radiation Therapy   Adjuvant Radiation therapy   10/14/2011 - 10/03/2018 Anti-estrogen oral therapy   Arimidex 1 mg daily developed a change switched to Femara and exemestane and then took tamoxifen and now back to Arimidex from 01/09/2014       CHIEF COMPLIANT: Follow-up of left breast cancer  INTERVAL HISTORY: Katelyn Walsh is a 69 y.o. with above-mentioned history of left breast cancer treated with lumpectomy, radiation, and who completed 5 years of anti-estrogen therapy with anastrozole. Mammogram on 11/23/19 showed no evidence of malignancy bilaterally. She presents to the clinic today for annual follow-up.    ALLERGIES:  is allergic to dust mite extract and pollen extract.  MEDICATIONS:  Current Outpatient Medications  Medication Sig Dispense Refill  . ALPRAZolam (XANAX) 0.5 MG tablet Take 0.5 mg by mouth at bedtime as needed for anxiety.    . Ascorbic Acid (VITAMIN C) 500 MG CAPS Take 1 capsule by mouth daily.    [name] Kitchen aspirin EC 81 MG tablet Take 81 mg by mouth daily.    . cholecalciferol 5000 units TABS Take 1 tablet (5,000 Units total) by mouth daily.    . citalopram (CELEXA) 20 MG tablet Take 20 mg by mouth daily.    . furosemide (LASIX) 40 MG tablet Take 0.5  tablets (20 mg total) by mouth daily as needed for fluid or edema (take 1 tablet a day for 2 to 3 days if you notice leg swelling or weight gain). 30 tablet 0  . hydrOXYzine (ATARAX/VISTARIL) 25 MG tablet Take 1 tablet (25 mg total) by mouth 3 (three) times daily as needed. 30 tablet 6  . levocetirizine (XYZAL) 5 MG tablet Take 1 tablet (5 mg total) by mouth every evening.    [name] Kitchen losartan (COZAAR) 100 MG tablet Take 100 mg by mouth every morning.     . montelukast (SINGULAIR) 10 MG tablet Take 10 mg by mouth at bedtime.    . Probiotic Product (ALIGN) 4 MG CAPS Take 1 capsule by mouth daily with breakfast.     . rOPINIRole (REQUIP) 1 MG tablet Take 1-2 mg by mouth at bedtime.     . rosuvastatin (CRESTOR) 20 MG tablet Take 20 mg by mouth at bedtime.    . topiramate (TOPAMAX) 100 MG tablet Take 100 mg by mouth 2 (two) times daily.    [name] Kitchen zinc gluconate 50 MG tablet Take 50 mg by mouth daily.     No current facility-administered medications for this visit.    PHYSICAL EXAMINATION: ECOG PERFORMANCE STATUS: 1 - Symptomatic but completely ambulatory  Vitals:   10/03/19 1114  BP: 120/64  Pulse: 62  Resp: 18  Temp: 98.2 F (36.8 C)  SpO2: 98%   Filed Weights  10/03/19 1114  Weight: 201 lb 14.4 oz (91.6 kg)    BREAST: No palpable masses or nodules in either right or left breasts. No palpable axillary supraclavicular or infraclavicular adenopathy no breast tenderness or nipple discharge. (exam performed in the presence of a chaperone)  LABORATORY DATA:  I have reviewed the data as listed CMP Latest Ref Rng & Units 10/02/2019 05/11/2019 04/27/2019  Glucose 65 - 99 mg/dL 116(H) 140(H) 178(H)  BUN 8 - 27 mg/dL _0 Creatinine 0.57 - 1.00 mg/dL 0.87 0.97 1.09(H)  Sodium 134 - 144 mmol/L 140 144 139  Potassium 3.5 - 5.2 mmol/L 4.4 4.4 4.1  Chloride 96 - 106 mmol/L 105 109(H) 107  CO2 20 - 29 mmol/L 21 19(L) 21(L)  Calcium 8.7 - 10.3 mg/dL 9.0 9.3 8.7(L)  Total Protein 6.0 - 8.5 g/dL 7.7 7.2  -  Total Bilirubin 0.0 - 1.2 mg/dL 0.5 0.6 -  Alkaline Phos 48 - 121 IU/L 92 87 -  AST 0 - 40 IU/L 19 20 -  ALT 0 - 32 IU/L 19 19 -    Lab Results  Component Value Date   WBC 6.0 04/26/2019   HGB 12.6 04/26/2019   HCT 40.5 04/26/2019   MCV 96.2 04/26/2019   PLT 171 04/26/2019   NEUTROABS 3.1 07/31/2014    ASSESSMENT & PLAN:  Primary cancer of upper inner quadrant of left female breast ([place]) Left breast invasive ductal carcinoma, 1.8 cm, T1 C. N0 M0 stage IA ER percent, PR negative, HER-2 negative, grade 2, Ki-67 90%, 3 SLN negative, Oncotype DX recurrence score 18, percent risk of recurrence, status post radiation therapy, Arimidex 1 mg daily was started August 2013 later switched to exemestane then switched to Femara and then switch to tamoxifen and again switched back to Anastrozole Nov 2015.  Completed August 2020  Anastrozole toxicities: 1. Itching of the skin: Did not improve by stopping anastrozole  encouraged her to use moisturizers.. Bone density December 2015 revealed a T score of -0.9, normal bone density 3. Weight gain issues:  She is working with healthy weight loss clinic and has lost 30 pounds  Patient [facility] to [facility].  Breast Cancer Surveillance: 1. Breast exam8/11/2019: Normal 2. Mammogram  11/23/2018 no evidence of malignancy breast density category B 12/15/2016: MRI pelvis: Perianal fistulous CT angiogram 04/25/2019: No evidence of PE Lower extremity edema due to diastolic dysfunction on diuretics.   Return to clinic in1 yearfor follow-up for surveillance.    No orders of the defined types were placed in this encounter.  The patient has a good understanding of the overall plan. she agrees with it. she will call with any problems that may develop before the next visit here.  Total time spent: 20 mins including face to face time and time spent for planning, charting and coordination of care  Nicholas Lose, MD 10/03/2019  I, Cloyde Reams Dorshimer,  am acting as scribe for Dr. Nicholas Lose.  I have reviewed the above documentation for accuracy and completeness, and I agree with the above.

## 2019-10-03 ENCOUNTER — Inpatient Hospital Stay: Payer: Medicare Other | Attending: Hematology and Oncology | Admitting: Hematology and Oncology

## 2019-10-03 ENCOUNTER — Other Ambulatory Visit: Payer: Self-pay

## 2019-10-03 DIAGNOSIS — R5383 Other fatigue: Secondary | ICD-10-CM | POA: Insufficient documentation

## 2019-10-03 DIAGNOSIS — Z853 Personal history of malignant neoplasm of breast: Secondary | ICD-10-CM | POA: Insufficient documentation

## 2019-10-03 DIAGNOSIS — C50212 Malignant neoplasm of upper-inner quadrant of left female breast: Secondary | ICD-10-CM

## 2019-10-03 DIAGNOSIS — Z923 Personal history of irradiation: Secondary | ICD-10-CM | POA: Diagnosis not present

## 2019-10-03 DIAGNOSIS — R739 Hyperglycemia, unspecified: Secondary | ICD-10-CM | POA: Insufficient documentation

## 2019-10-03 LAB — COMPREHENSIVE METABOLIC PANEL
ALT: 19 IU/L (ref 0–32)
AST: 19 IU/L (ref 0–40)
Albumin/Globulin Ratio: 1.7 (ref 1.2–2.2)
Albumin: 4.8 g/dL (ref 3.8–4.8)
Alkaline Phosphatase: 92 IU/L (ref 48–121)
BUN/Creatinine Ratio: 22 (ref 12–28)
BUN: 19 mg/dL (ref 8–27)
Bilirubin Total: 0.5 mg/dL (ref 0.0–1.2)
CO2: 21 mmol/L (ref 20–29)
Calcium: 9 mg/dL (ref 8.7–10.3)
Chloride: 105 mmol/L (ref 96–106)
Creatinine, Ser: 0.87 mg/dL (ref 0.57–1.00)
GFR calc Af Amer: 78 mL/min/{1.73_m2} (ref >59)
GFR calc non Af Amer: 68 mL/min/{1.73_m2} (ref >59)
Globulin, Total: 2.9 g/dL (ref 1.5–4.5)
Glucose: 116 mg/dL — ABNORMAL HIGH (ref 65–99)
Potassium: 4.4 mmol/L (ref 3.5–5.2)
Sodium: 140 mmol/L (ref 134–144)
Total Protein: 7.7 g/dL (ref 6.0–8.5)

## 2019-10-03 LAB — HEMOGLOBIN A1C
Est. average glucose Bld gHb Est-mCnc: 126 mg/dL
Hgb A1c MFr Bld: 6 % — ABNORMAL HIGH (ref 4.8–5.6)

## 2019-10-03 LAB — LIPID PANEL WITH LDL/HDL RATIO
Cholesterol, Total: 114 mg/dL (ref 100–199)
HDL: 40 mg/dL (ref >39)
LDL Chol Calc (NIH): 56 mg/dL (ref 0–99)
LDL/HDL Ratio: 1.4 ratio (ref 0.0–3.2)
Triglycerides: 91 mg/dL (ref 0–149)
VLDL Cholesterol Cal: 18 mg/dL (ref 5–40)

## 2019-10-03 LAB — T3: T3, Total: 143 ng/dL (ref 71–180)

## 2019-10-03 LAB — INSULIN, RANDOM: INSULIN: 10.6 u[IU]/mL (ref 2.6–24.9)

## 2019-10-03 LAB — TSH: TSH: 0.997 u[IU]/mL (ref 0.450–4.500)

## 2019-10-03 LAB — T4: T4, Total: 7.2 ug/dL (ref 4.5–12.0)

## 2019-10-03 LAB — VITAMIN D 25 HYDROXY (VIT D DEFICIENCY, FRACTURES): Vit D, 25-Hydroxy: 51.3 ng/mL (ref 30.0–100.0)

## 2019-10-03 MED ORDER — HYDROXYZINE HCL 25 MG PO TABS: 25.0000 mg | ORAL_TABLET | Freq: Three times a day (TID) | ORAL | 6 refills | Status: AC | PRN

## 2019-10-03 NOTE — Assessment & Plan Note (Signed)
Left breast invasive ductal carcinoma, 1.8 cm, T1 C. N0 M0 stage IA ER percent, PR negative, HER-2 negative, grade 2, Ki-67 90%, 3 SLN negative, Oncotype DX recurrence score 18, percent risk of recurrence, status post radiation therapy, Arimidex 1 mg daily was started August 2013 later switched to exemestane then switched to Femara and then switch to tamoxifen and again switched back to Anastrozole Nov 2015.  Anastrozole toxicities: 1. Itching of the skin: Did not improve by stopping anastrozole  2. Bone density December 2015 revealed a T score of -0.9, normal bone density, this will need to be repeated 3. Fatigue and weight gain:Due to lack of exercise. Stressed importance of physical exercise and eating healthy diet 4. Dizziness and neck pain: Much improved   5. Weight gain issues: Patient has not been exercising and has been overeating and she claims that it is a stress that is making her eat.  We spent 15 minutes discussing her diet and set up some goals as well as exercise plan.  She completed 7 years of therapy and I have instructed her to stop the anastrozole. Patient loves going to [facility].   Breast Cancer Surveillance: 1. Breast exam8/11/2019: Normal 2. Mammogram  11/23/2018 no evidence of malignancy breast density category B 12/15/2016: MRI pelvis: Perianal fistulous CT angiogram 04/25/2019: No evidence of PE Lower extremity edema due to diastolic dysfunction on diuretics.  Return to clinic in 1 year for follow-up  Return to clinic in1 yearfor follow-up for surveillance.

## 2019-10-03 NOTE — Progress Notes (Signed)
Chief Complaint:   OBESITY Katelyn Walsh is here to discuss her progress with her obesity treatment plan along with follow-up of her obesity related diagnoses. Katelyn Walsh is on the Category 2 Plan and states she is following her eating plan approximately 60% of the time. Jamara states she is exercising 0 minutes 0 times per week.  Today's visit was #: 10 Starting weight: 224 lbs Starting date: 05/11/2019 Today's weight: 197 lbs Today's date: 10/02/2019 Total lbs lost to date: 27 Total lbs lost since last in-office visit: 4  Interim History: Katelyn Walsh states today would have been her mother's 97th birthday. She plans on moving back home in the next 2 weeks and is looking forward to being in her own home again. Due to her mother's birthday, she has been snacking more frequently.  Subjective:   Essential hypertension. Blood pressure is at goal today. Katelyn Walsh is on losartan 100 mg daily and furosemide 40 mg PRN edema (rarely takes). Ambulatory blood pressure readings - systolic blood pressure upper 66'Y to 403; diastolic blood pressure 60 to 70's.  BP Readings from Last 3 Encounters:  10/02/19 118/77  09/13/19 103/64  08/29/19 115/68   Lab Results  Component Value Date   CREATININE 0.97 05/11/2019   CREATININE 1.09 (H) 04/27/2019   CREATININE 1.02 (H) 04/26/2019   Other hyperlipidemia. Katelyn Walsh is on rosuvastatin 20 mg QHS. She denies myalgias.  Lab Results  Component Value Date   CHOL 114 10/02/2019   HDL 40 10/02/2019   LDLCALC 56 10/02/2019   TRIG 91 10/02/2019   Lab Results  Component Value Date   ALT 19 10/02/2019   AST 19 10/02/2019   ALKPHOS 92 10/02/2019   BILITOT 0.5 10/02/2019   The ASCVD Risk score Mikey Bussing DC Jr., et al., 2013) failed to calculate for the following reasons:   The valid total cholesterol range is 130 to 320 mg/dL  Hyperglycemia. Katelyn Walsh has a history of some elevated blood glucose readings without a diagnosis of diabetes. She denies polyphagia. Elevated  blood glucose and insulin levels are noted in Epic. Katelyn Walsh is not on metformin.  Other fatigue, increased. Katelyn Walsh is on OTC Vitamin D3 5,000 IU once daily. She has a family history of thyroid disease (mother). She reports increased fatigue.  Assessment/Plan:   Essential hypertension. Steffani is working on healthy weight loss and exercise to improve blood pressure control. We will watch for signs of hypotension as she continues her lifestyle modifications. Comprehensive metabolic panel will be checked today.   Other hyperlipidemia. Cardiovascular risk and specific lipid/LDL goals reviewed.  We discussed several lifestyle modifications today and Janesia will continue to work on diet, exercise and weight loss efforts. Orders and follow up as documented in patient record. Lipid Panel With LDL/HDL Ratio will be checked today.  Counseling Intensive lifestyle modifications are the first line treatment for this issue. . Dietary changes: Increase soluble fiber. Decrease simple carbohydrates. . Exercise changes: Moderate to vigorous-intensity aerobic activity 150 minutes per week if tolerated.  . Lipid-lowering medications: see documented in medical record.  Hyperglycemia. Fasting labs will be obtained and results with be discussed with Katelyn Walsh in 2 weeks at her follow up visit. In the meanwhile Katelyn Walsh was started on a lower simple carbohydrate diet and will work on weight loss efforts. Hemoglobin A1c, Insulin, random labs will be checked today.  Other fatigue, increased. Fatigue may be related to obesity, depression or many other causes. Labs will be ordered, and in the meanwhile, Kayte will focus  on self care including making healthy food choices, increasing physical activity and focusing on stress reduction. Labs will be checked today.  Class 1 obesity with serious comorbidity and body mass index (BMI) of 33.0 to 33.9 in adult, unspecified obesity type.  Katelyn Walsh is currently in the action stage of change. As  such, her goal is to continue with weight loss efforts. She has agreed to the Category 2 Plan. She will keep snack calories below 200.  Exercise goals: No exercise has been prescribed at this time.  Behavioral modification strategies: increasing lean protein intake, meal planning and cooking strategies, better snacking choices and planning for success.  Katelyn Walsh has agreed to follow-up with our clinic in 2 weeks. She was informed of the importance of frequent follow-up visits to maximize her success with intensive lifestyle modifications for her multiple health conditions.   Katelyn Walsh was informed we would discuss her lab results at her next visit unless there is a critical issue that needs to be addressed sooner. Katelyn Walsh agreed to keep her next visit at the agreed upon time to discuss these results.  Objective:   Blood pressure 118/77, pulse 69, temperature (!) 97.4 F (36.3 C), temperature source Oral, height 5\' 4"  (1.626 m), weight 197 lb (89.4 kg), SpO2 97 %. Body mass index is 33.81 kg/m.  General: Cooperative, alert, well developed, in no acute distress. HEENT: Conjunctivae and lids unremarkable. Cardiovascular: Regular rhythm.  Lungs: Normal work of breathing. Neurologic: No focal deficits.   Lab Results  Component Value Date   CREATININE 0.97 05/11/2019   BUN 15 05/11/2019   NA 144 05/11/2019   K 4.4 05/11/2019   CL 109 (H) 05/11/2019   CO2 19 (L) 05/11/2019   Lab Results  Component Value Date   ALT 19 05/11/2019   AST 20 05/11/2019   ALKPHOS 87 05/11/2019   BILITOT 0.6 05/11/2019   No results found for: HGBA1C Lab Results  Component Value Date   INSULIN 20.3 05/11/2019   Lab Results  Component Value Date   TSH 1.299 07/31/2014   No results found for: CHOL, HDL, LDLCALC, LDLDIRECT, TRIG, CHOLHDL Lab Results  Component Value Date   WBC 6.0 04/26/2019   HGB 12.6 04/26/2019   HCT 40.5 04/26/2019   MCV 96.2 04/26/2019   PLT 171 04/26/2019   No results found for:  IRON, TIBC, FERRITIN  Obesity Behavioral Intervention Documentation for Insurance:   Approximately 15 minutes were spent on the discussion below.  ASK: We discussed the diagnosis of obesity with Katelyn Walsh today and Margarita agreed to give [place] permission to discuss obesity behavioral modification therapy today.  ASSESS: Ytzel has the diagnosis of obesity and her BMI today is 33.8. Kassandra is in the action stage of change.   ADVISE: Algie was educated on the multiple health risks of obesity as well as the benefit of weight loss to improve her health. She was advised of the need for long term treatment and the importance of lifestyle modifications to improve her current health and to decrease her risk of future health problems.  AGREE: Multiple dietary modification options and treatment options were discussed and Latricia agreed to follow the recommendations documented in the above note.  ARRANGE: Shelbi was educated on the importance of frequent visits to treat obesity as outlined per CMS and USPSTF guidelines and agreed to schedule her next follow up appointment today.  Attestation Statements:   Reviewed by clinician on day of visit: allergies, medications, problem list, medical history, surgical history, family  history, social history, and previous encounter notes.  I, Michaelene Song, am acting as Location manager for [employer], NP-C   I have reviewed the above documentation for accuracy and completeness, and I agree with the above. -  Esaw Grandchild, NP

## 2019-10-04 ENCOUNTER — Telehealth: Payer: Self-pay | Admitting: Hematology and Oncology

## 2019-10-04 NOTE — Telephone Encounter (Signed)
No 8/10 los, no changes made pt schedule

## 2019-10-16 ENCOUNTER — Other Ambulatory Visit: Payer: Self-pay

## 2019-10-16 ENCOUNTER — Encounter (INDEPENDENT_AMBULATORY_CARE_PROVIDER_SITE_OTHER): Payer: Self-pay | Admitting: Adult Health

## 2019-10-16 ENCOUNTER — Ambulatory Visit (INDEPENDENT_AMBULATORY_CARE_PROVIDER_SITE_OTHER): Payer: Medicare Other | Admitting: Adult Health

## 2019-10-16 VITALS — BP 104/65 | HR 65 | Temp 97.7°F | Ht 64.0 in | Wt 197.0 lb

## 2019-10-16 DIAGNOSIS — E7849 Other hyperlipidemia: Secondary | ICD-10-CM

## 2019-10-16 DIAGNOSIS — R7303 Prediabetes: Secondary | ICD-10-CM | POA: Diagnosis not present

## 2019-10-16 DIAGNOSIS — E559 Vitamin D deficiency, unspecified: Secondary | ICD-10-CM

## 2019-10-16 DIAGNOSIS — E669 Obesity, unspecified: Secondary | ICD-10-CM

## 2019-10-16 DIAGNOSIS — I10 Essential (primary) hypertension: Secondary | ICD-10-CM

## 2019-10-16 DIAGNOSIS — Z6833 Body mass index (BMI) 33.0-33.9, adult: Secondary | ICD-10-CM

## 2019-10-16 DIAGNOSIS — E66811 Obesity, class 1: Secondary | ICD-10-CM

## 2019-10-17 DIAGNOSIS — E559 Vitamin D deficiency, unspecified: Secondary | ICD-10-CM | POA: Insufficient documentation

## 2019-10-17 NOTE — Progress Notes (Signed)
Chief Complaint:   OBESITY Katelyn Walsh is here to discuss her progress with her obesity treatment plan along with follow-up of her obesity related diagnoses. Katelyn Walsh is on the Category 2 Plan and states she is following her eating plan approximately 40% of the time. Katelyn Walsh states she is exercising for 0 minutes 0 times per week.  Today's visit was #: 11 Starting weight: 224 lbs Starting date: 05/11/2019 Today's weight: 197 lbs Today's date: 10/16/2019 Total lbs lost to date: 27 lbs Total lbs lost since last in-office visit: 0  Interim History: Katelyn Walsh recently traveled to [place] for one week.  She will be traveling to [facility] this weekend for 3 days.  She continues to enjoy the foods and structure of the Category 2 plan.  She denies excessive cravings or polyphagia when she eats on plan.  Subjective:   1. Essential hypertension Blood pressure is stable at office visit today.  She denies acute cardiac symptoms.  She is on losartan 100 mg daily.  BP Readings from Last 3 Encounters:  10/16/19 104/65  10/03/19 120/64  10/02/19 118/77   2. Other hyperlipidemia Katelyn Walsh has hyperlipidemia and has been trying to improve her cholesterol levels with intensive lifestyle modification including a low saturated fat diet, exercise and weight loss. She denies any chest pain, claudication or myalgias.  Lipid panel stable.  She is on rosuvastatin 20 mg daily.  LFTs within normal limits.  Lab Results  Component Value Date   ALT 19 10/02/2019   AST 19 10/02/2019   ALKPHOS 92 10/02/2019   BILITOT 0.5 10/02/2019   Lab Results  Component Value Date   CHOL 114 10/02/2019   HDL 40 10/02/2019   LDLCALC 56 10/02/2019   TRIG 91 10/02/2019   3. Prediabetes Katelyn Walsh has a diagnosis of prediabetes based on her elevated HgA1c and was informed this puts her at greater risk of developing diabetes. She continues to work on diet and exercise to decrease her risk of diabetes. She denies nausea or  hypoglycemia.  Blood glucose, A1c, insulin labs were elevated - now are improving!  She denies polyphagia.  She is on metformin.  Lab Results  Component Value Date   HGBA1C 6.0 (H) 10/02/2019   Lab Results  Component Value Date   INSULIN 10.6 10/02/2019   INSULIN 20.3 05/11/2019   4. Vitamin D deficiency Katelyn Walsh's Vitamin D level was 51.3 on 10/02/2019. She is currently taking OTC vitamin D 5,000 IU each day. She denies nausea, vomiting or muscle weakness.  Vitamin D level at goal.    Assessment/Plan:   1. Essential hypertension Discussed labs with patient today.  Katelyn Walsh is working on healthy weight loss and exercise to improve blood pressure control. We will watch for signs of hypotension as she continues her lifestyle modifications.  Continue ARB and Category 2 plan.  2. Other hyperlipidemia Discussed labs with patient today.  Cardiovascular risk and specific lipid/LDL goals reviewed.  We discussed several lifestyle modifications today and Katelyn Walsh will continue to work on diet, exercise and weight loss efforts. Orders and follow up as documented in patient record. Continue statin therapy and Category 2 meal plan.  Counseling Intensive lifestyle modifications are the first line treatment for this issue. . Dietary changes: Increase soluble fiber. Decrease simple carbohydrates. . Exercise changes: Moderate to vigorous-intensity aerobic activity 150 minutes per week if tolerated. Lipid-lowering medications: see documented in medical record.  3. Prediabetes Discussed labs with patient today.  Katelyn Walsh will continue to work on  weight loss, exercise, and decreasing simple carbohydrates to help decrease the risk of diabetes.  Continue consuming adequate protein daily.   4. Vitamin D deficiency Discussed labs with patient today.  Low Vitamin D level contributes to fatigue and are associated with obesity, breast, and colon cancer. She agrees to continue to take OTC Vitamin D @5 ,000 IU daily and will  follow-up for routine testing of Vitamin D, at least 2-3 times per year to avoid over-replacement.    5. Class 1 obesity with serious comorbidity and body mass index (BMI) of 33.0 to 33.9 in adult, unspecified obesity type Katelyn Walsh is currently in the action stage of change. As such, her goal is to continue with weight loss efforts. She has agreed to the Category 2 Plan.   Exercise goals: Walking as tolerated.  Behavioral modification strategies: increasing lean protein intake, decreasing simple carbohydrates, increasing vegetables, meal planning and cooking strategies, better snacking choices, travel eating strategies and planning for success.  Katelyn Walsh has agreed to follow-up with our clinic in 2 weeks. She was informed of the importance of frequent follow-up visits to maximize her success with intensive lifestyle modifications for her multiple health conditions.   Objective:   Blood pressure 104/65, pulse 65, temperature 97.7 F (36.5 C), temperature source Oral, height 5\' 4"  (1.626 m), weight 197 lb (89.4 kg), SpO2 98 %. Body mass index is 33.81 kg/m.  General: Cooperative, alert, well developed, in no acute distress. HEENT: Conjunctivae and lids unremarkable. Cardiovascular: Regular rhythm.  Lungs: Normal work of breathing. Neurologic: No focal deficits.   Lab Results  Component Value Date   CREATININE 0.87 10/02/2019   BUN 19 10/02/2019   NA 140 10/02/2019   K 4.4 10/02/2019   CL 105 10/02/2019   CO2 21 10/02/2019   Lab Results  Component Value Date   ALT 19 10/02/2019   AST 19 10/02/2019   ALKPHOS 92 10/02/2019   BILITOT 0.5 10/02/2019   Lab Results  Component Value Date   HGBA1C 6.0 (H) 10/02/2019   Lab Results  Component Value Date   INSULIN 10.6 10/02/2019   INSULIN 20.3 05/11/2019   Lab Results  Component Value Date   TSH 0.997 10/02/2019   Lab Results  Component Value Date   CHOL 114 10/02/2019   HDL 40 10/02/2019   LDLCALC 56 10/02/2019   TRIG 91  10/02/2019   Lab Results  Component Value Date   WBC 6.0 04/26/2019   HGB 12.6 04/26/2019   HCT 40.5 04/26/2019   MCV 96.2 04/26/2019   PLT 171 04/26/2019   Attestation Statements:   Reviewed by clinician on day of visit: allergies, medications, problem list, medical history, surgical history, family history, social history, and previous encounter notes.  Time spent on visit including pre-visit chart review and post-visit care and charting was 29 minutes.   I, Water quality scientist, CMA, am acting as Location manager for Mina Marble, NP.  I have reviewed the above documentation for accuracy and completeness, and I agree with the above. -  Esaw Grandchild, NP

## 2019-10-31 ENCOUNTER — Other Ambulatory Visit: Payer: Self-pay

## 2019-10-31 ENCOUNTER — Encounter (INDEPENDENT_AMBULATORY_CARE_PROVIDER_SITE_OTHER): Payer: Self-pay | Admitting: Adult Health

## 2019-10-31 ENCOUNTER — Ambulatory Visit (INDEPENDENT_AMBULATORY_CARE_PROVIDER_SITE_OTHER): Payer: Medicare Other | Admitting: Adult Health

## 2019-10-31 VITALS — BP 145/78 | HR 71 | Temp 98.6°F | Ht 64.0 in | Wt 194.0 lb

## 2019-10-31 DIAGNOSIS — R7303 Prediabetes: Secondary | ICD-10-CM | POA: Diagnosis not present

## 2019-10-31 DIAGNOSIS — Z6833 Body mass index (BMI) 33.0-33.9, adult: Secondary | ICD-10-CM | POA: Diagnosis not present

## 2019-10-31 DIAGNOSIS — E669 Obesity, unspecified: Secondary | ICD-10-CM

## 2019-10-31 DIAGNOSIS — I10 Essential (primary) hypertension: Secondary | ICD-10-CM | POA: Diagnosis not present

## 2019-11-01 NOTE — Progress Notes (Signed)
Chief Complaint:   OBESITY Katelyn Walsh is here to discuss her progress with her obesity treatment plan along with follow-up of her obesity related diagnoses. Katelyn Walsh is on the Category 2 Plan and states she is following her eating plan approximately 70% of the time. Katelyn Walsh states she is exercising 0 minutes 0 times per week.  Today's visit was #: 12 Starting weight: 224 lbs Starting date: 05/11/2019 Today's weight: 194 lbs Today's date: 10/31/2019 Total lbs lost to date: 30 Total lbs lost since last in-office visit: 3  Interim History: Katelyn Walsh will experience evening cravings and will snack on [place] bars, PB Fit 2 powder, and [place] crackers. She has been trying to increase her protein at each meal. She will be traveling to [facility] soon. We reviewed travel eating strategies.  Subjective:   Essential hypertension. Systolic blood pressure is slightly elevated today; she did not take her ARB prior to today's office visit. Katelyn Walsh is on losartan 100 mg daily and furosemide for edema. Last CMP was stable; GFR improved. She estimates to use diuretic 0-1 times per month.  BP Readings from Last 3 Encounters:  10/31/19 (!) 145/78  10/16/19 104/65  10/03/19 120/64   Lab Results  Component Value Date   CREATININE 0.87 10/02/2019   CREATININE 0.97 05/11/2019   CREATININE 1.09 (H) 04/27/2019   Prediabetes. Katelyn Walsh has a diagnosis of prediabetes based on her elevated HgA1c and was informed this puts her at greater risk of developing diabetes. She continues to work on diet and exercise to decrease her risk of diabetes. She denies nausea or hypoglycemia. Blood glucose and insulin levels are improving. Katelyn Walsh is not on metformin.  Lab Results  Component Value Date   HGBA1C 6.0 (H) 10/02/2019   Lab Results  Component Value Date   INSULIN 10.6 10/02/2019   INSULIN 20.3 05/11/2019   Assessment/Plan:   Essential hypertension. Katelyn Walsh is working on healthy weight loss and exercise to improve blood  pressure control. We will watch for signs of hypotension as she continues her lifestyle modifications. She will continue her current prescription regimen as directed and continue to follow the Category 2 meal plan.  Prediabetes. Katelyn Walsh will continue to work on weight loss, exercise, and decreasing simple carbohydrates to help decrease the risk of diabetes. We reviewed her last "glucose panel" which showed levels to be improved. Katelyn Walsh will continue to follow the Category 2 meal plan.  Class 1 obesity with serious comorbidity and body mass index (BMI) of 33.0 to 33.9 in adult, unspecified obesity type.  Katelyn Walsh is currently in the action stage of change. As such, her goal is to continue with weight loss efforts. She has agreed to the Category 2 Plan and will journal 200-300 calories and 20 grams of protein at breakfast. We recommended snacking on [place] jerky.   Exercise goals: No exercise has been prescribed at this time.  Behavioral modification strategies: increasing lean protein intake, no skipping meals, meal planning and cooking strategies, better snacking choices, travel eating strategies and planning for success.  Katelyn Walsh has agreed to follow-up with our clinic in 2 weeks. She was informed of the importance of frequent follow-up visits to maximize her success with intensive lifestyle modifications for her multiple health conditions.   Objective:   Blood pressure (!) 145/78, pulse 71, temperature 98.6 F (37 C), temperature source Oral, height 5\' 4"  (1.626 m), weight 194 lb (88 kg), SpO2 95 %. Body mass index is 33.3 kg/m.  General: Cooperative, alert, well  developed, in no acute distress. HEENT: Conjunctivae and lids unremarkable. Cardiovascular: Regular rhythm.  Lungs: Normal work of breathing. Neurologic: No focal deficits.   Lab Results  Component Value Date   CREATININE 0.87 10/02/2019   BUN 19 10/02/2019   NA 140 10/02/2019   K 4.4 10/02/2019   CL 105 10/02/2019   CO2 21  10/02/2019   Lab Results  Component Value Date   ALT 19 10/02/2019   AST 19 10/02/2019   ALKPHOS 92 10/02/2019   BILITOT 0.5 10/02/2019   Lab Results  Component Value Date   HGBA1C 6.0 (H) 10/02/2019   Lab Results  Component Value Date   INSULIN 10.6 10/02/2019   INSULIN 20.3 05/11/2019   Lab Results  Component Value Date   TSH 0.997 10/02/2019   Lab Results  Component Value Date   CHOL 114 10/02/2019   HDL 40 10/02/2019   LDLCALC 56 10/02/2019   TRIG 91 10/02/2019   Lab Results  Component Value Date   WBC 6.0 04/26/2019   HGB 12.6 04/26/2019   HCT 40.5 04/26/2019   MCV 96.2 04/26/2019   PLT 171 04/26/2019   No results found for: IRON, TIBC, FERRITIN  Attestation Statements:   Reviewed by clinician on day of visit: allergies, medications, problem list, medical history, surgical history, family history, social history, and previous encounter notes.  Time spent on visit including pre-visit chart review and post-visit charting and care was 25 minutes.   I, Michaelene Song, am acting as Location manager for [employer], NP-C   I have reviewed the above documentation for accuracy and completeness, and I agree with the above. -  Esaw Grandchild, NP

## 2019-11-06 DIAGNOSIS — L309 Dermatitis, unspecified: Secondary | ICD-10-CM | POA: Diagnosis not present

## 2019-11-06 DIAGNOSIS — E1169 Type 2 diabetes mellitus with other specified complication: Secondary | ICD-10-CM | POA: Diagnosis not present

## 2019-11-06 DIAGNOSIS — F419 Anxiety disorder, unspecified: Secondary | ICD-10-CM | POA: Diagnosis not present

## 2019-11-07 ENCOUNTER — Other Ambulatory Visit: Payer: Self-pay | Admitting: Internal Medicine

## 2019-11-07 DIAGNOSIS — Z1231 Encounter for screening mammogram for malignant neoplasm of breast: Secondary | ICD-10-CM

## 2019-11-14 ENCOUNTER — Ambulatory Visit (INDEPENDENT_AMBULATORY_CARE_PROVIDER_SITE_OTHER): Payer: Medicare Other | Admitting: Adult Health

## 2019-11-16 ENCOUNTER — Encounter (INDEPENDENT_AMBULATORY_CARE_PROVIDER_SITE_OTHER): Payer: Self-pay | Admitting: Adult Health

## 2019-11-16 ENCOUNTER — Other Ambulatory Visit: Payer: Self-pay

## 2019-11-16 ENCOUNTER — Ambulatory Visit (INDEPENDENT_AMBULATORY_CARE_PROVIDER_SITE_OTHER): Payer: Medicare Other | Admitting: Adult Health

## 2019-11-16 VITALS — BP 116/71 | HR 58 | Temp 97.9°F | Ht 64.0 in | Wt 195.0 lb

## 2019-11-16 DIAGNOSIS — Z6833 Body mass index (BMI) 33.0-33.9, adult: Secondary | ICD-10-CM

## 2019-11-16 DIAGNOSIS — E669 Obesity, unspecified: Secondary | ICD-10-CM

## 2019-11-16 DIAGNOSIS — I10 Essential (primary) hypertension: Secondary | ICD-10-CM

## 2019-11-16 DIAGNOSIS — E7849 Other hyperlipidemia: Secondary | ICD-10-CM | POA: Diagnosis not present

## 2019-11-20 NOTE — Progress Notes (Signed)
Chief Complaint:   OBESITY Katelyn Walsh is here to discuss her progress with her obesity treatment plan along with follow-up of her obesity related diagnoses. Katelyn Walsh is on the Category 2 Plan and states she is following her eating plan approximately 40% of the time. Katelyn Walsh states she walked on vacation.  Today's visit was #: 40 Starting weight: 224 lbs Starting date: 05/11/2019 Today's weight: 195 lbs Today's date: 11/16/2019 Total lbs lost to date: 29 lbs Total lbs lost since last in-office visit: 0  Interim History: Katelyn Walsh traveled to [facility] for one week.  She ate at [employer], [employer], [employer], and [employer].  She ate at [employer] for breakfast and had eggs with sausage and fruit.  She walked 4-8 miles per day while in [facility]  She went to a [employer] concert and she really enjoyed the performance.    Subjective:   1. Essential hypertension Review: taking medications as instructed, no medication side effects noted, no chest pain on exertion, no dyspnea on exertion, no swelling of ankles.  BP/HR is excellent today.  She is on losartan 100 mg daily.  She had to take a one dose of furosemide 40 mg due to lower extremity edema, while she was travelling in [place].    BP Readings from Last 3 Encounters:  11/16/19 116/71  10/31/19 (!) 145/78  10/16/19 104/65   2. Other hyperlipidemia Katelyn Walsh has hyperlipidemia and has been trying to improve her cholesterol levels with intensive lifestyle modification including a low saturated fat diet, exercise and weight loss. She denies any chest pain, claudication or myalgias.  On 10/02/2019, lipid panel was stable.  Lab Results  Component Value Date   ALT 19 10/02/2019   AST 19 10/02/2019   ALKPHOS 92 10/02/2019   BILITOT 0.5 10/02/2019   Lab Results  Component Value Date   CHOL 114 10/02/2019   HDL 40 10/02/2019   LDLCALC 56 10/02/2019   TRIG 91 10/02/2019   Assessment/Plan:   1. Essential  hypertension Katelyn Walsh is working on healthy weight loss and exercise to improve blood pressure control. We will watch for signs of hypotension as she continues her lifestyle modifications.  Continue ARB daily and diuretic as needed.  2. Other hyperlipidemia Cardiovascular risk and specific lipid/LDL goals reviewed.  We discussed several lifestyle modifications today and Katelyn Walsh will continue to work on diet, exercise and weight loss efforts. Orders and follow up as documented in patient record.  Continue Category 2 meal plan and statin therapy.   Counseling Intensive lifestyle modifications are the first line treatment for this issue. . Dietary changes: Increase soluble fiber. Decrease simple carbohydrates. . Exercise changes: Moderate to vigorous-intensity aerobic activity 150 minutes per week if tolerated. . Lipid-lowering medications: see documented in medical record.  3. Class 1 obesity with serious comorbidity and body mass index (BMI) of 33.0 to 33.9 in adult, unspecified obesity type Katelyn Walsh is currently in the action stage of change. As such, her goal is to continue with weight loss efforts. She has agreed to the Category 2 Plan.   Exercise goals: Daily walking.  Behavioral modification strategies: increasing lean protein intake, decreasing simple carbohydrates, meal planning and cooking strategies, travel eating strategies and planning for success.  Katelyn Walsh has agreed to follow-up with our clinic in 2 weeks. She was informed of the importance of frequent follow-up visits to maximize her success with intensive lifestyle modifications for her multiple health conditions.   Objective:  Blood pressure 116/71, pulse (!) 58, temperature 97.9 F (36.6 C), height 5\' 4"  (1.626 m), weight 195 lb (88.5 kg), SpO2 99 %. Body mass index is 33.47 kg/m.  General: Cooperative, alert, well developed, in no acute distress. HEENT: Conjunctivae and lids unremarkable. Cardiovascular: Regular rhythm.  Lungs:  Normal work of breathing. Neurologic: No focal deficits.   Lab Results  Component Value Date   CREATININE 0.87 10/02/2019   BUN 19 10/02/2019   NA 140 10/02/2019   K 4.4 10/02/2019   CL 105 10/02/2019   CO2 21 10/02/2019   Lab Results  Component Value Date   ALT 19 10/02/2019   AST 19 10/02/2019   ALKPHOS 92 10/02/2019   BILITOT 0.5 10/02/2019   Lab Results  Component Value Date   HGBA1C 6.0 (H) 10/02/2019   Lab Results  Component Value Date   INSULIN 10.6 10/02/2019   INSULIN 20.3 05/11/2019   Lab Results  Component Value Date   TSH 0.997 10/02/2019   Lab Results  Component Value Date   CHOL 114 10/02/2019   HDL 40 10/02/2019   LDLCALC 56 10/02/2019   TRIG 91 10/02/2019   Lab Results  Component Value Date   WBC 6.0 04/26/2019   HGB 12.6 04/26/2019   HCT 40.5 04/26/2019   MCV 96.2 04/26/2019   PLT 171 04/26/2019   Attestation Statements:   Reviewed by clinician on day of visit: allergies, medications, problem list, medical history, surgical history, family history, social history, and previous encounter notes.  Time spent on visit including pre-visit chart review and post-visit care and charting was 25 minutes.   I, Water quality scientist, CMA, am acting as Location manager for Mina Marble, NP.  I have reviewed the above documentation for accuracy and completeness, and I agree with the above. -  Esaw Grandchild, NP

## 2019-11-21 DIAGNOSIS — J301 Allergic rhinitis due to pollen: Secondary | ICD-10-CM | POA: Diagnosis not present

## 2019-11-21 DIAGNOSIS — J3081 Allergic rhinitis due to animal (cat) (dog) hair and dander: Secondary | ICD-10-CM | POA: Diagnosis not present

## 2019-11-21 DIAGNOSIS — J3089 Other allergic rhinitis: Secondary | ICD-10-CM | POA: Diagnosis not present

## 2019-11-21 DIAGNOSIS — L299 Pruritus, unspecified: Secondary | ICD-10-CM | POA: Diagnosis not present

## 2019-11-27 DIAGNOSIS — Z23 Encounter for immunization: Secondary | ICD-10-CM | POA: Diagnosis not present

## 2019-11-30 ENCOUNTER — Other Ambulatory Visit: Payer: Self-pay

## 2019-11-30 ENCOUNTER — Encounter (INDEPENDENT_AMBULATORY_CARE_PROVIDER_SITE_OTHER): Payer: Self-pay | Admitting: Adult Health

## 2019-11-30 ENCOUNTER — Ambulatory Visit (INDEPENDENT_AMBULATORY_CARE_PROVIDER_SITE_OTHER): Payer: Medicare Other | Admitting: Adult Health

## 2019-11-30 VITALS — BP 110/73 | HR 66 | Temp 98.3°F | Ht 64.0 in | Wt 192.0 lb

## 2019-11-30 DIAGNOSIS — R7303 Prediabetes: Secondary | ICD-10-CM

## 2019-11-30 DIAGNOSIS — E669 Obesity, unspecified: Secondary | ICD-10-CM

## 2019-11-30 DIAGNOSIS — I10 Essential (primary) hypertension: Secondary | ICD-10-CM

## 2019-11-30 DIAGNOSIS — Z6833 Body mass index (BMI) 33.0-33.9, adult: Secondary | ICD-10-CM | POA: Diagnosis not present

## 2019-11-30 NOTE — Progress Notes (Signed)
Chief Complaint:   OBESITY Katelyn Walsh is here to discuss her progress with her obesity treatment plan along with follow-up of her obesity related diagnoses. [name] is on the Category 2 Plan and states she is following her eating plan approximately 50-60% of the time. [name] states she is exercising 0 minutes 0 times per week.  Today's visit was #: 14 Starting weight: 224 lbs Starting date: 05/11/2019 Today's weight: 192 lbs Today's date: 11/30/2019 Total lbs lost to date: 32 Total lbs lost since last in-office visit: 3  Interim History: [name] is down another 3 lbs to equal a total weight loss of 32 lbs since starting the program! She continues to enjoy the foods on the plan and has incorporated [place] sausage into breakfast meals.  Subjective:   Essential hypertension. Blood pressure and heart rate are excellent at today's office visit. [name] is on losartan 100 mg daily and furosemide 40 mg PRN edema, which she estimates to use 1-2 tablets per month.  BP Readings from Last 3 Encounters:  11/30/19 110/73  11/16/19 116/71  10/31/19 (!) 145/78   Lab Results  Component Value Date   CREATININE 0.87 10/02/2019   CREATININE 0.97 05/11/2019   CREATININE 1.09 (H) 04/27/2019   Prediabetes. Leiah has a diagnosis of prediabetes based on her elevated HgA1c and was informed this puts her at greater risk of developing diabetes. She continues to work on diet and exercise to decrease her risk of diabetes. She denies nausea or hypoglycemia. 10/02/2019 A1c 6.0, insulin level 10.6 with a blood glucose of 116. Insulin and blood glucose levels are both improved from last check. [name] is not on metformin.  Lab Results  Component Value Date   HGBA1C 6.0 (H) 10/02/2019   Lab Results  Component Value Date   INSULIN 10.6 10/02/2019   INSULIN 20.3 05/11/2019   Assessment/Plan:   Essential hypertension. [name] is working on healthy weight loss and exercise to improve blood pressure control. We  will watch for signs of hypotension as she continues her lifestyle modifications. She will continue anti-hypertensive therapy as directed and will continue healthy eating.  Prediabetes. [name] will continue to work on weight loss, exercise, and decreasing simple carbohydrates to help decrease the risk of diabetes. She will continue healthy eating and will increase regular walking.  Class 1 obesity with serious comorbidity and body mass index (BMI) of 33.0 to 33.9 in adult, unspecified obesity type.  [name] is currently in the action stage of change. As such, her goal is to continue with weight loss efforts. She has agreed to the Category 2 Plan.   Exercise goals: [name] will walk as tolerated for exercise.  Behavioral modification strategies: increasing lean protein intake, decreasing simple carbohydrates, meal planning and cooking strategies and planning for success.  [name] has agreed to follow-up with our clinic in 2 weeks. She was informed of the importance of frequent follow-up visits to maximize her success with intensive lifestyle modifications for her multiple health conditions.   Objective:   Blood pressure 110/73, pulse 66, temperature 98.3 F (36.8 C), height 5\' 4"  (1.626 m), weight 192 lb (87.1 kg), SpO2 99 %. Body mass index is 32.96 kg/m.  General: Cooperative, alert, well developed, in no acute distress. HEENT: Conjunctivae and lids unremarkable. Cardiovascular: Regular rhythm.  Lungs: Normal work of breathing. Neurologic: No focal deficits.   Lab Results  Component Value Date   CREATININE 0.87 10/02/2019   BUN 19 10/02/2019   NA 140 10/02/2019  K 4.4 10/02/2019   CL 105 10/02/2019   CO2 21 10/02/2019   Lab Results  Component Value Date   ALT 19 10/02/2019   AST 19 10/02/2019   ALKPHOS 92 10/02/2019   BILITOT 0.5 10/02/2019   Lab Results  Component Value Date   HGBA1C 6.0 (H) 10/02/2019   Lab Results  Component Value Date   INSULIN 10.6 10/02/2019    INSULIN 20.3 05/11/2019   Lab Results  Component Value Date   TSH 0.997 10/02/2019   Lab Results  Component Value Date   CHOL 114 10/02/2019   HDL 40 10/02/2019   LDLCALC 56 10/02/2019   TRIG 91 10/02/2019   Lab Results  Component Value Date   WBC 6.0 04/26/2019   HGB 12.6 04/26/2019   HCT 40.5 04/26/2019   MCV 96.2 04/26/2019   PLT 171 04/26/2019   No results found for: IRON, TIBC, FERRITIN  Attestation Statements:   Reviewed by clinician on day of visit: allergies, medications, problem list, medical history, surgical history, family history, social history, and previous encounter notes.  Time spent on visit including pre-visit chart review and post-visit charting and care was 28 minutes.   I, Michaelene Song, am acting as Location manager for [employer], NP-C   I have reviewed the above documentation for accuracy and completeness, and I agree with the above. -  Dantae Meunier d. Mahki Spikes, NP-C

## 2019-12-13 ENCOUNTER — Ambulatory Visit: Payer: Medicare Other

## 2019-12-14 ENCOUNTER — Other Ambulatory Visit: Payer: Self-pay

## 2019-12-14 ENCOUNTER — Encounter (INDEPENDENT_AMBULATORY_CARE_PROVIDER_SITE_OTHER): Payer: Self-pay | Admitting: Adult Health

## 2019-12-14 ENCOUNTER — Ambulatory Visit (INDEPENDENT_AMBULATORY_CARE_PROVIDER_SITE_OTHER): Payer: Medicare Other | Admitting: Adult Health

## 2019-12-14 VITALS — BP 107/64 | HR 60 | Temp 98.1°F | Ht 64.0 in | Wt 193.0 lb

## 2019-12-14 DIAGNOSIS — I10 Essential (primary) hypertension: Secondary | ICD-10-CM | POA: Diagnosis not present

## 2019-12-14 DIAGNOSIS — E669 Obesity, unspecified: Secondary | ICD-10-CM | POA: Diagnosis not present

## 2019-12-14 DIAGNOSIS — E7849 Other hyperlipidemia: Secondary | ICD-10-CM | POA: Diagnosis not present

## 2019-12-14 DIAGNOSIS — Z6833 Body mass index (BMI) 33.0-33.9, adult: Secondary | ICD-10-CM | POA: Diagnosis not present

## 2019-12-14 DIAGNOSIS — R7303 Prediabetes: Secondary | ICD-10-CM

## 2019-12-18 NOTE — Progress Notes (Signed)
Chief Complaint:   OBESITY Katelyn Walsh is here to discuss her progress with her obesity treatment plan along with follow-up of her obesity related diagnoses. Katelyn Walsh is on the Category 2 Plan and states she is following her eating plan approximately 20% of the time. [name] states she is exercising 0 minutes 0 times per week.  Today's visit was #: 15 Starting weight: 224 lbs Starting date: 05/11/2019 Today's weight: 193 lbs Today's date: 12/14/2019 Total lbs lost to date: 31 Total lbs lost since last in-office visit: 0  Interim History: Katelyn Walsh had to wait more than a week for a replacement refrigerator and now her kitchen is fully functioning again. She then traveled to [place] she experienced bilateral lower extremity edema that required 2 doses of furosemide to correct. She ate off plan while traveling.  She will be home more often over the next two weeks and can consistently eat on plan.  Subjective:   Essential hypertension. Blood pressure and heart rate are stable. [name] denies cardiac symptoms. She had to use 2 doses of furosemide while at [place] due to bilateral lower extremity edema. She is on losartan 100 mg daily.  BP Readings from Last 3 Encounters:  12/14/19 107/64  11/30/19 110/73  11/16/19 116/71   Lab Results  Component Value Date   CREATININE 0.87 10/02/2019   CREATININE 0.97 05/11/2019   CREATININE 1.09 (H) 04/27/2019   Other hyperlipidemia. Lipid panel on 10/02/2019 was stable. Katelyn Walsh is on rosuvastatin 20 mg daily.  Lab Results  Component Value Date   CHOL 114 10/02/2019   HDL 40 10/02/2019   LDLCALC 56 10/02/2019   TRIG 91 10/02/2019   Lab Results  Component Value Date   ALT 19 10/02/2019   AST 19 10/02/2019   ALKPHOS 92 10/02/2019   BILITOT 0.5 10/02/2019   The ASCVD Risk score Mikey Bussing DC Jr., et al., 2013) failed to calculate for the following reasons:   The valid total cholesterol range is 130  to 320 mg/dL  Prediabetes. Fallon has a diagnosis of prediabetes based on her elevated HgA1c and was informed this puts her at greater risk of developing diabetes. She continues to work on diet and exercise to decrease her risk of diabetes. She denies nausea or hypoglycemia. 10/02/2019 blood glucose 116, A1c 6.0 with an insulin of 10.6. Blood glucose and insulin levels are both improved from last check. Katelyn Walsh is not on metformin.  Lab Results  Component Value Date   HGBA1C 6.0 (H) 10/02/2019   Lab Results  Component Value Date   INSULIN 10.6 10/02/2019   INSULIN 20.3 05/11/2019   Assessment/Plan:   Essential hypertension. [name] is working on healthy weight loss and exercise to improve blood pressure control. We will watch for signs of hypotension as she continues her lifestyle modifications. Labs will be checked at the time of her next office visit. We recommend compression socks when traveling.  Other hyperlipidemia. Cardiovascular risk and specific lipid/LDL goals reviewed.  We discussed several lifestyle modifications today and [name] will continue to work on diet, exercise and weight loss efforts. Orders and follow up as documented in patient record. She will continue her statin as directed. Labs will be checked at her next office visit.  Counseling Intensive lifestyle modifications are the first line treatment for this issue. . Dietary changes: Increase soluble fiber. Decrease simple carbohydrates. . Exercise changes: Moderate to vigorous-intensity aerobic activity 150 minutes per week if tolerated. [name] Kitchen  Lipid-lowering medications: see documented in medical record.  Prediabetes. Katelyn Walsh will continue to work on weight loss, exercise, and decreasing simple carbohydrates to help decrease the risk of diabetes. Labs will be checked at the time of her next office visit.  Class 1 obesity with serious comorbidity and body mass index (BMI) of 33.0 to 33.9 in adult, unspecified obesity type.  Katelyn Walsh  is currently in the action stage of change. As such, her goal is to continue with weight loss efforts. She has agreed to the Category 2 Plan.   Fasting labs will be checked at the time of her next office visit. She will wear compression socks 8-10 mmHg pressure when travelling or seating for long periods of time to reduce lower extremity edema.  Exercise goals: Katelyn Walsh will walk 5 minutes a day for exercise.  Behavioral modification strategies: increasing lean protein intake, decreasing simple carbohydrates, meal planning and cooking strategies and planning for success.  [name] has agreed to follow-up with our clinic fasting in 2 weeks. She was informed of the importance of frequent follow-up visits to maximize her success with intensive lifestyle modifications for her multiple health conditions.   Objective:   Blood pressure 107/64, pulse 60, temperature 98.1 F (36.7 C), height 5\' 4"  (1.626 m), weight 193 lb (87.5 kg), SpO2 98 %. Body mass index is 33.13 kg/m.  General: Cooperative, alert, well developed, in no acute distress. HEENT: Conjunctivae and lids unremarkable. Cardiovascular: Regular rhythm.  Lungs: Normal work of breathing. Neurologic: No focal deficits.   Lab Results  Component Value Date   CREATININE 0.87 10/02/2019   BUN 19 10/02/2019   NA 140 10/02/2019   K 4.4 10/02/2019   CL 105 10/02/2019   CO2 21 10/02/2019   Lab Results  Component Value Date   ALT 19 10/02/2019   AST 19 10/02/2019   ALKPHOS 92 10/02/2019   BILITOT 0.5 10/02/2019   Lab Results  Component Value Date   HGBA1C 6.0 (H) 10/02/2019   Lab Results  Component Value Date   INSULIN 10.6 10/02/2019   INSULIN 20.3 05/11/2019   Lab Results  Component Value Date   TSH 0.997 10/02/2019   Lab Results  Component Value Date   CHOL 114 10/02/2019   HDL 40 10/02/2019   LDLCALC 56 10/02/2019   TRIG 91 10/02/2019   Lab Results  Component Value Date   WBC 6.0 04/26/2019   HGB 12.6 04/26/2019    HCT 40.5 04/26/2019   MCV 96.2 04/26/2019   PLT 171 04/26/2019   No results found for: IRON, TIBC, FERRITIN  Attestation Statements:   Reviewed by clinician on day of visit: allergies, medications, problem list, medical history, surgical history, family history, social history, and previous encounter notes.  Time spent on visit including pre-visit chart review and post-visit charting and care was 32 minutes.   I, Michaelene Song, am acting as Location manager for [employer], NP-C   I have reviewed the above documentation for accuracy and completeness, and I agree with the above. -  Starlena Beil d. Ramani Riva, NP-C

## 2019-12-28 ENCOUNTER — Other Ambulatory Visit: Payer: Self-pay

## 2019-12-28 ENCOUNTER — Ambulatory Visit (INDEPENDENT_AMBULATORY_CARE_PROVIDER_SITE_OTHER): Payer: Medicare Other | Admitting: Adult Health

## 2019-12-28 ENCOUNTER — Encounter (INDEPENDENT_AMBULATORY_CARE_PROVIDER_SITE_OTHER): Payer: Self-pay | Admitting: Adult Health

## 2019-12-28 VITALS — BP 117/70 | HR 60 | Temp 97.9°F | Ht 64.0 in | Wt 195.0 lb

## 2019-12-28 DIAGNOSIS — E669 Obesity, unspecified: Secondary | ICD-10-CM

## 2019-12-28 DIAGNOSIS — Z6833 Body mass index (BMI) 33.0-33.9, adult: Secondary | ICD-10-CM | POA: Diagnosis not present

## 2019-12-28 DIAGNOSIS — I10 Essential (primary) hypertension: Secondary | ICD-10-CM

## 2019-12-28 DIAGNOSIS — R632 Polyphagia: Secondary | ICD-10-CM | POA: Diagnosis not present

## 2019-12-28 NOTE — Progress Notes (Signed)
Chief Complaint:   OBESITY Katelyn Walsh is here to discuss her progress with her obesity treatment plan along with follow-up of her obesity related diagnoses. Katelyn Walsh is on the Category 2 Plan and states she is following her eating plan approximately 30% of the time. Katelyn Walsh states she is exercising 0 minutes 0 times per week.  Today's visit was #: 64 Starting weight: 224 lbs Starting date: 05/11/2019 Today's weight: 195 lbs Today's date: 12/28/2019 Total lbs lost to date: 29 Total lbs lost since last in-office visit: 0  Interim History: Everlean reports increased hunger at nighttime and has been trying to choose the healthiest snack options to satisfy hunger while staying on plan. Her ultimate goal is to lose down to 165, which would be a BMI of 28.3.  Subjective:   Essential hypertension. Blood pressure and heart rate are excellent on today's office visit.  Katelyn Walsh is on losartan 100 mg daily and PRN furosemide 40 mg 1/2 tab - last dose of furosemide was over a month ago. She purchased and has been using compression hose.  BP Readings from Last 3 Encounters:  12/28/19 117/70  12/14/19 107/64  11/30/19 110/73   Lab Results  Component Value Date   CREATININE 0.87 10/02/2019   CREATININE 0.97 05/11/2019   CREATININE 1.09 (H) 04/27/2019   Polyphagia. Katelyn Walsh reports an increase in evening hunger. She is on topiramate for migraine treatment. She has elevated A1c - prediabetes, elevated insulin level.  Assessment/Plan:   Essential hypertension. Katelyn Walsh is working on healthy weight loss and exercise to improve blood pressure control. We will watch for signs of hypotension as she continues her lifestyle modifications. She will continue ARB, healthy eating, and compression hose.  Polyphagia. Katelyn Walsh was advised to choose higher protein snacks and change timing of lunch and dinner meals.  Class 1 obesity with serious comorbidity and body mass index (BMI) of 33.0 to 33.9 in adult, unspecified  obesity type.  Katelyn Walsh is currently in the action stage of change. As such, her goal is to continue with weight loss efforts. She has agreed to the Category 2 Plan.   She will change lunch/dinner meal timing. We recommended an evening snack of 3/4 serving of Reduced Fat Creamy Jiff peanut butter with 1 slice taosted 45 calorie bread.  Exercise goals: No exercise has been prescribed at this time.  Behavioral modification strategies: increasing lean protein intake, meal planning and cooking strategies, better snacking choices and planning for success.  Katelyn Walsh has agreed to follow-up with our clinic fasting in 2 weeks. She was informed of the importance of frequent follow-up visits to maximize her success with intensive lifestyle modifications for her multiple health conditions.   Objective:   Blood pressure 117/70, pulse 60, temperature 97.9 F (36.6 C), height 5\' 4"  (1.626 m), weight 195 lb (88.5 kg), SpO2 97 %. Body mass index is 33.47 kg/m.  General: Cooperative, alert, well developed, in no acute distress. HEENT: Conjunctivae and lids unremarkable. Cardiovascular: Regular rhythm.  Lungs: Normal work of breathing. Neurologic: No focal deficits.   Lab Results  Component Value Date   CREATININE 0.87 10/02/2019   BUN 19 10/02/2019   NA 140 10/02/2019   K 4.4 10/02/2019   CL 105 10/02/2019   CO2 21 10/02/2019   Lab Results  Component Value Date   ALT 19 10/02/2019   AST 19 10/02/2019   ALKPHOS 92 10/02/2019   BILITOT 0.5 10/02/2019   Lab Results  Component Value Date   HGBA1C  6.0 (H) 10/02/2019   Lab Results  Component Value Date   INSULIN 10.6 10/02/2019   INSULIN 20.3 05/11/2019   Lab Results  Component Value Date   TSH 0.997 10/02/2019   Lab Results  Component Value Date   CHOL 114 10/02/2019   HDL 40 10/02/2019   LDLCALC 56 10/02/2019   TRIG 91 10/02/2019   Lab Results  Component Value Date   WBC 6.0 04/26/2019   HGB 12.6 04/26/2019   HCT 40.5 04/26/2019    MCV 96.2 04/26/2019   PLT 171 04/26/2019   No results found for: IRON, TIBC, FERRITIN  Attestation Statements:   Reviewed by clinician on day of visit: allergies, medications, problem list, medical history, surgical history, family history, social history, and previous encounter notes.  Time spent on visit including pre-visit chart review and post-visit charting and care was 32 minutes.   I, Michaelene Song, am acting as Location manager for [employer], NP-C   I have reviewed the above documentation for accuracy and completeness, and I agree with the above. -  Alanys Godino d. Devann Cribb, NP-C

## 2020-01-11 ENCOUNTER — Encounter (INDEPENDENT_AMBULATORY_CARE_PROVIDER_SITE_OTHER): Payer: Self-pay | Admitting: Adult Health

## 2020-01-11 ENCOUNTER — Other Ambulatory Visit: Payer: Self-pay

## 2020-01-11 ENCOUNTER — Ambulatory Visit (INDEPENDENT_AMBULATORY_CARE_PROVIDER_SITE_OTHER): Payer: Medicare Other | Admitting: Adult Health

## 2020-01-11 VITALS — BP 112/68 | HR 56 | Temp 97.5°F | Ht 64.0 in | Wt 195.0 lb

## 2020-01-11 DIAGNOSIS — E669 Obesity, unspecified: Secondary | ICD-10-CM

## 2020-01-11 DIAGNOSIS — Z6833 Body mass index (BMI) 33.0-33.9, adult: Secondary | ICD-10-CM | POA: Diagnosis not present

## 2020-01-11 DIAGNOSIS — R600 Localized edema: Secondary | ICD-10-CM

## 2020-01-11 DIAGNOSIS — I10 Essential (primary) hypertension: Secondary | ICD-10-CM

## 2020-01-15 ENCOUNTER — Other Ambulatory Visit: Payer: Self-pay

## 2020-01-15 ENCOUNTER — Ambulatory Visit
Admission: RE | Admit: 2020-01-15 | Discharge: 2020-01-15 | Disposition: A | Discharge status: Home / Self Care | Payer: Medicare Other | Source: Ambulatory Visit | Attending: Internal Medicine | Admitting: Internal Medicine

## 2020-01-15 DIAGNOSIS — R600 Localized edema: Secondary | ICD-10-CM | POA: Insufficient documentation

## 2020-01-15 DIAGNOSIS — Z1231 Encounter for screening mammogram for malignant neoplasm of breast: Secondary | ICD-10-CM | POA: Diagnosis not present

## 2020-01-15 NOTE — Progress Notes (Signed)
Chief Complaint:   OBESITY Katelyn Walsh is here to discuss her progress with her obesity treatment plan along with follow-up of her obesity related diagnoses. Katelyn Walsh is on the Category 2 Plan and states she is following her eating plan approximately 30% of the time. Katelyn Walsh states she is exercising 0 minutes 0 times per week.  Today's visit was #: 8 Starting weight: 224 lbs Starting date: 05/11/2019 Today's weight: 195 lbs Today's date: 01/11/2020 Total lbs lost to date: 29 Total lbs lost since last in-office visit: 0  Interim History: Due to allergies, Katelyn Walsh was unable to grocery shop and procure protein for the Category 2 meal plan. She had low energy levels and did not cook as often as she would like. She is frustrated with weight loss plateau.  Her goal is to lose at least 2 lbs by her next office visit.  Subjective:   Essential hypertension. Blood pressure and heart rate are at goal on today's office visit. Katelyn Walsh is on losartan 100 mg daily.  BP Readings from Last 3 Encounters:  01/11/20 112/68  12/28/19 117/70  12/14/19 107/64   Lab Results  Component Value Date   CREATININE 0.87 10/02/2019   CREATININE 0.97 05/11/2019   CREATININE 1.09 (H) 04/27/2019   Lower extremity edema. Katelyn Walsh has purchased compression socks and reports a significant reduction in her lower extremity edema. She has not needed PRN furosemide in weeks.  Assessment/Plan:   Essential hypertension. Katelyn Walsh is working on healthy weight loss and exercise to improve blood pressure control. We will watch for signs of hypotension as she continues her lifestyle modifications. Katelyn Walsh will continue her ARB as directed and limit her salt intake.  Lower extremity edema. Katelyn Walsh will continue PRN furosemide as directed. She will continue the use of compression socks and elevate her lower extremities when able.  Class 1 obesity with serious comorbidity and body mass index (BMI) of 33.0 to 33.9 in adult, unspecified  obesity type.  Katelyn Walsh is currently in the action stage of change. As such, her goal is to continue with weight loss efforts. She has agreed to the Category 2 Plan.   She will write out meal plan when traveling, will remain well hydrated, will set small goals, and will have IC at her next office visit.  Handouts were provided on Thanksgiving and Mini Pumpkin Pies.  Exercise goals: Katelyn Walsh will increase her daily walking.  Behavioral modification strategies: increasing lean protein intake, decreasing simple carbohydrates, no skipping meals, meal planning and cooking strategies, travel eating strategies, holiday eating strategies  and planning for success.  Katelyn Walsh has agreed to follow-up with our clinic fasting for IC in 3 weeks. She was informed of the importance of frequent follow-up visits to maximize her success with intensive lifestyle modifications for her multiple health conditions.   Objective:   Blood pressure 112/68, pulse (!) 56, temperature (!) 97.5 F (36.4 C), height 5\' 4"  (1.626 m), weight 195 lb (88.5 kg), SpO2 98 %. Body mass index is 33.47 kg/m.  General: Cooperative, alert, well developed, in no acute distress. HEENT: Conjunctivae and lids unremarkable. Cardiovascular: Regular rhythm.  Lungs: Normal work of breathing. Neurologic: No focal deficits.   Lab Results  Component Value Date   CREATININE 0.87 10/02/2019   BUN 19 10/02/2019   NA 140 10/02/2019   K 4.4 10/02/2019   CL 105 10/02/2019   CO2 21 10/02/2019   Lab Results  Component Value Date   ALT 19 10/02/2019   AST  19 10/02/2019   ALKPHOS 92 10/02/2019   BILITOT 0.5 10/02/2019   Lab Results  Component Value Date   HGBA1C 6.0 (H) 10/02/2019   Lab Results  Component Value Date   INSULIN 10.6 10/02/2019   INSULIN 20.3 05/11/2019   Lab Results  Component Value Date   TSH 0.997 10/02/2019   Lab Results  Component Value Date   CHOL 114 10/02/2019   HDL 40 10/02/2019   LDLCALC 56 10/02/2019    TRIG 91 10/02/2019   Lab Results  Component Value Date   WBC 6.0 04/26/2019   HGB 12.6 04/26/2019   HCT 40.5 04/26/2019   MCV 96.2 04/26/2019   PLT 171 04/26/2019   No results found for: IRON, TIBC, FERRITIN  Attestation Statements:   Reviewed by clinician on day of visit: allergies, medications, problem list, medical history, surgical history, family history, social history, and previous encounter notes.  Time spent on visit including pre-visit chart review and post-visit charting and care was 35 minutes.   I, Michaelene Song, am acting as Location manager for [employer], NP-C   I have reviewed the above documentation for accuracy and completeness, and I agree with the above. -  Desmund Elman d. Jerrard Bradburn, NP-C

## 2020-01-30 ENCOUNTER — Encounter (INDEPENDENT_AMBULATORY_CARE_PROVIDER_SITE_OTHER): Payer: Self-pay | Admitting: Adult Health

## 2020-01-30 ENCOUNTER — Other Ambulatory Visit: Payer: Self-pay

## 2020-01-30 ENCOUNTER — Ambulatory Visit (INDEPENDENT_AMBULATORY_CARE_PROVIDER_SITE_OTHER): Payer: Medicare Other | Admitting: Adult Health

## 2020-01-30 VITALS — BP 124/71 | HR 62 | Temp 98.0°F | Ht 64.0 in | Wt 190.0 lb

## 2020-01-30 DIAGNOSIS — R739 Hyperglycemia, unspecified: Secondary | ICD-10-CM | POA: Diagnosis not present

## 2020-01-30 DIAGNOSIS — Z6832 Body mass index (BMI) 32.0-32.9, adult: Secondary | ICD-10-CM

## 2020-01-30 DIAGNOSIS — R0602 Shortness of breath: Secondary | ICD-10-CM

## 2020-01-30 DIAGNOSIS — I10 Essential (primary) hypertension: Secondary | ICD-10-CM

## 2020-01-30 DIAGNOSIS — E669 Obesity, unspecified: Secondary | ICD-10-CM

## 2020-01-30 NOTE — Progress Notes (Signed)
Chief Complaint:   OBESITY Katelyn Walsh is here to discuss her progress with her obesity treatment plan along with follow-up of her obesity related diagnoses. Katelyn Walsh is on the Category 2 Plan and states she is following her eating plan approximately 60-70% of the time. Katelyn Walsh states she is walking 30 minutes 2 times per week.  Today's visit was #: 18 Starting weight: 224 lbs Starting date: 05/11/2019 Today's weight: 190 lbs Today's date: 01/30/2020 Total lbs lost to date: 34 Total lbs lost since last in-office visit: 5  Interim History: Katelyn Walsh avoided mashed potatoes and utilized [employer] during the Thanksgiving holiday. She has been eating out more frequently with family over the holiday season and again is employing the [employer].  Subjective:   SOB (shortness of breath) on exertion. Katelyn Walsh reports mild shortness of breath with extreme exertion. She denies chest pain with exertion. IC was checked today and revealed an RMR of 1495; IC on 05/11/2019 revealed an RMR of 1985 showing metabolism slowed by 490 cals.  Essential hypertension. Blood pressure and heart rate are excellent on today's office visit. Katelyn Walsh is on losartan 100 mg daily and furosemide 40 mg 1/2 tablet PRN edema (1 tab every 2-3 days).  BP Readings from Last 3 Encounters:  01/30/20 124/71  01/11/20 112/68  12/28/19 117/70   Lab Results  Component Value Date   CREATININE 0.87 10/02/2019   CREATININE 0.97 05/11/2019   CREATININE 1.09 (H) 04/27/2019   Hyperglycemia. Katelyn Walsh has a history of some elevated blood glucose readings without a diagnosis of diabetes. Epic shows elevated blood glucose and A1c levels. She is not on metformin and denies polyphagia.  Assessment/Plan:   SOB (shortness of breath) on exertion. Katelyn Walsh's shortness of breath appears to be obesity related and exercise induced. She has agreed to work on weight loss and gradually increase exercise to treat her exercise induced shortness of breath. Will  continue to monitor closely. IC was checked today. Katelyn Walsh will increase her protein and continue to walk frequently.  Essential hypertension. Katelyn Walsh is working on healthy weight loss and exercise to improve blood pressure control. We will watch for signs of hypotension as she continues her lifestyle modifications. Labs will be checked today.   Hyperglycemia.  Fasting labs will be obtained and results with be discussed with Katelyn Walsh in 2 weeks at her follow up visit. In the meanwhile Katelyn Walsh was started on a lower simple carbohydrate diet and will work on weight loss efforts. Labs will be checked today.   Class 1 obesity with serious comorbidity and body mass index (BMI) of 32.0 to 32.9 in adult, unspecified obesity type.  Katelyn Walsh is currently in the action stage of change. As such, her goal is to continue with weight loss efforts. She has agreed to the Category 2 Plan.   We recommended a [employer] peanut butter bar in the evening.  Handouts were provided on Holiday Strategies and Holiday Recipes.  Exercise goals: Katelyn Walsh will continue walking 30 minutes 2 times per week.  Behavioral modification strategies: increasing lean protein intake, meal planning and cooking strategies, holiday eating strategies  and planning for success.  Katelyn Walsh has agreed to follow-up with our clinic in 2 weeks. She was informed of the importance of frequent follow-up visits to maximize her success with intensive lifestyle modifications for her multiple health conditions.   Katelyn Walsh was informed we would discuss her lab results at her next visit unless there is a critical issue that needs to be addressed sooner.  Katelyn Walsh agreed to keep her next visit at the agreed upon time to discuss these results.  Objective:   Blood pressure 124/71, pulse 62, temperature 98 F (36.7 C), height 5\' 4"  (1.626 m), weight 190 lb (86.2 kg), SpO2 98 %. Body mass index is 32.61 kg/m.  General: Cooperative, alert, well developed, in no acute  distress. HEENT: Conjunctivae and lids unremarkable. Cardiovascular: Regular rhythm.  Lungs: Normal work of breathing. Neurologic: No focal deficits.   Lab Results  Component Value Date   CREATININE 0.87 10/02/2019   BUN 19 10/02/2019   NA 140 10/02/2019   K 4.4 10/02/2019   CL 105 10/02/2019   CO2 21 10/02/2019   Lab Results  Component Value Date   ALT 19 10/02/2019   AST 19 10/02/2019   ALKPHOS 92 10/02/2019   BILITOT 0.5 10/02/2019   Lab Results  Component Value Date   HGBA1C 6.0 (H) 10/02/2019   Lab Results  Component Value Date   INSULIN 10.6 10/02/2019   INSULIN 20.3 05/11/2019   Lab Results  Component Value Date   TSH 0.997 10/02/2019   Lab Results  Component Value Date   CHOL 114 10/02/2019   HDL 40 10/02/2019   LDLCALC 56 10/02/2019   TRIG 91 10/02/2019   Lab Results  Component Value Date   WBC 6.0 04/26/2019   HGB 12.6 04/26/2019   HCT 40.5 04/26/2019   MCV 96.2 04/26/2019   PLT 171 04/26/2019   No results found for: IRON, TIBC, FERRITIN  Obesity Behavioral Intervention:   Approximately 15 minutes were spent on the discussion below.  ASK: We discussed the diagnosis of obesity with Katelyn Walsh today and Katelyn Walsh agreed to give [place] permission to discuss obesity behavioral modification therapy today.  ASSESS: Katelyn Walsh has the diagnosis of obesity and her BMI today is 32.7. Katelyn Walsh is in the action stage of change.   ADVISE: Katelyn Walsh was educated on the multiple health risks of obesity as well as the benefit of weight loss to improve her health. She was advised of the need for long term treatment and the importance of lifestyle modifications to improve her current health and to decrease her risk of future health problems.  AGREE: Multiple dietary modification options and treatment options were discussed and Katelyn Walsh agreed to follow the recommendations documented in the above note.  ARRANGE: Katelyn Walsh was educated on the importance of frequent visits to treat obesity  as outlined per CMS and USPSTF guidelines and agreed to schedule her next follow up appointment today.  Attestation Statements:   Reviewed by clinician on day of visit: allergies, medications, problem list, medical history, surgical history, family history, social history, and previous encounter notes.  I, Michaelene Song, am acting as Location manager for [employer], NP-C   I have reviewed the above documentation for accuracy and completeness, and I agree with the above. -  Verita Kuroda d. Teresa Nicodemus, NP-C

## 2020-01-31 LAB — COMPREHENSIVE METABOLIC PANEL
ALT: 18 IU/L (ref 0–32)
AST: 18 IU/L (ref 0–40)
Albumin/Globulin Ratio: 1.9 (ref 1.2–2.2)
Albumin: 5 g/dL — ABNORMAL HIGH (ref 3.8–4.8)
Alkaline Phosphatase: 91 IU/L (ref 44–121)
BUN/Creatinine Ratio: 17 (ref 12–28)
BUN: 17 mg/dL (ref 8–27)
Bilirubin Total: 0.7 mg/dL (ref 0.0–1.2)
CO2: 23 mmol/L (ref 20–29)
Calcium: 9.7 mg/dL (ref 8.7–10.3)
Chloride: 104 mmol/L (ref 96–106)
Creatinine, Ser: 1.01 mg/dL — ABNORMAL HIGH (ref 0.57–1.00)
GFR calc Af Amer: 65 mL/min/{1.73_m2} (ref >59)
GFR calc non Af Amer: 57 mL/min/{1.73_m2} — ABNORMAL LOW (ref >59)
Globulin, Total: 2.6 g/dL (ref 1.5–4.5)
Glucose: 118 mg/dL — ABNORMAL HIGH (ref 65–99)
Potassium: 4.3 mmol/L (ref 3.5–5.2)
Sodium: 143 mmol/L (ref 134–144)
Total Protein: 7.6 g/dL (ref 6.0–8.5)

## 2020-01-31 LAB — INSULIN, RANDOM: INSULIN: 9.7 u[IU]/mL (ref 2.6–24.9)

## 2020-01-31 LAB — HEMOGLOBIN A1C
Est. average glucose Bld gHb Est-mCnc: 131 mg/dL
Hgb A1c MFr Bld: 6.2 % — ABNORMAL HIGH (ref 4.8–5.6)

## 2020-02-13 ENCOUNTER — Ambulatory Visit (INDEPENDENT_AMBULATORY_CARE_PROVIDER_SITE_OTHER): Payer: Medicare Other | Admitting: Bariatrics

## 2020-02-13 ENCOUNTER — Encounter (INDEPENDENT_AMBULATORY_CARE_PROVIDER_SITE_OTHER): Payer: Self-pay | Admitting: Bariatrics

## 2020-02-13 ENCOUNTER — Other Ambulatory Visit: Payer: Self-pay

## 2020-02-13 VITALS — BP 114/69 | HR 57 | Temp 98.2°F | Ht 64.0 in | Wt 194.0 lb

## 2020-02-13 DIAGNOSIS — Z6833 Body mass index (BMI) 33.0-33.9, adult: Secondary | ICD-10-CM

## 2020-02-13 DIAGNOSIS — I10 Essential (primary) hypertension: Secondary | ICD-10-CM

## 2020-02-13 DIAGNOSIS — E669 Obesity, unspecified: Secondary | ICD-10-CM

## 2020-02-13 DIAGNOSIS — F5089 Other specified eating disorder: Secondary | ICD-10-CM

## 2020-02-13 DIAGNOSIS — E7849 Other hyperlipidemia: Secondary | ICD-10-CM | POA: Diagnosis not present

## 2020-02-13 MED ORDER — BUPROPION HCL ER (SR) 150 MG PO TB12: 150.0000 mg | ORAL_TABLET | Freq: Every day | ORAL | 0 refills | Status: DC

## 2020-02-14 ENCOUNTER — Encounter (INDEPENDENT_AMBULATORY_CARE_PROVIDER_SITE_OTHER): Payer: Self-pay | Admitting: Bariatrics

## 2020-02-14 NOTE — Progress Notes (Signed)
Chief Complaint:   OBESITY Katelyn Walsh is here to discuss her progress with her obesity treatment plan along with follow-up of her obesity related diagnoses. [name] is on the Category 2 Plan and states she is following her eating plan approximately 60-70% of the time. [name] states she has increased her walking for exercise.   Today's visit was #: 62 Starting weight: 224 lbs Starting date: 05/11/2019 Today's weight: 194 lbs Today's date: 02/13/2020 Total lbs lost to date: 30 Total lbs lost since last in-office visit: 0  Interim History: [name] is up 4 lbs, but has done well overall. She has been to more events. She states she still has more cravings at night.  Subjective:   Other hyperlipidemia. [name] is taking Crestor and denies myalgias.   Lab Results  Component Value Date   CHOL 114 10/02/2019   HDL 40 10/02/2019   LDLCALC 56 10/02/2019   TRIG 91 10/02/2019   Lab Results  Component Value Date   ALT 18 01/30/2020   AST 18 01/30/2020   ALKPHOS 91 01/30/2020   BILITOT 0.7 01/30/2020   The ASCVD Risk score Mikey Bussing DC Jr., et al., 2013) failed to calculate for the following reasons:   The valid total cholesterol range is 130 to 320 mg/dL  Essential hypertension. [name] is taking Cozaar. Blood pressure is controlled.  BP Readings from Last 3 Encounters:  02/13/20 114/69  01/30/20 124/71  01/11/20 112/68   Lab Results  Component Value Date   CREATININE 1.01 (H) 01/30/2020   CREATININE 0.87 10/02/2019   CREATININE 0.97 05/11/2019   Other disorder of eating. [name] is taking Celexa and denies contraindications.  Assessment/Plan:   Other hyperlipidemia. Cardiovascular risk and specific lipid/LDL goals reviewed.  We discussed several lifestyle modifications today and Yanelli will continue to work on diet, exercise and weight loss efforts. Orders and follow up as documented in patient record. She will continue her medication as directed.   Counseling Intensive  lifestyle modifications are the first line treatment for this issue. . Dietary changes: Increase soluble fiber. Decrease simple carbohydrates. . Exercise changes: Moderate to vigorous-intensity aerobic activity 150 minutes per week if tolerated.  . Lipid-lowering medications: see documented in medical record.  Essential hypertension. [name] is working on healthy weight loss and exercise to improve blood pressure control. We will watch for signs of hypotension as she continues her lifestyle modifications. She will continue her medication as directed.   Other disorder of eating. Prescription was given for buPROPion (WELLBUTRIN SR) 150 MG 12 hr tablet 1 daily #30 with 0 refills.  Class 1 obesity with serious comorbidity and body mass index (BMI) of 33.0 to 33.9 in adult, unspecified obesity type.  [name] is currently in the action stage of change. As such, her goal is to continue with weight loss efforts. She has agreed to the Category 2 Plan.   She will work on meal planning, intentional eating, increasing her water intake, decreasing her carbohydrate intake, and will take food on vacation.  Exercise goals: [name] will increase walking (will get a [employer]).  Behavioral modification strategies: increasing lean protein intake, decreasing simple carbohydrates, increasing vegetables, increasing water intake, decreasing eating out, no skipping meals, meal planning and cooking strategies, keeping healthy foods in the home, travel eating strategies, holiday eating strategies , celebration eating strategies, avoiding temptations and planning for success.  [name] has agreed to follow-up with our clinic in 2-3 weeks. She was informed of the importance of frequent follow-up visits to  maximize her success with intensive lifestyle modifications for her multiple health conditions.   Objective:   Blood pressure 114/69, pulse (!) 57, temperature 98.2 F (36.8 C), temperature source Oral, height 5\' 4"  (1.626 m),  weight 194 lb (88 kg), SpO2 98 %. Body mass index is 33.3 kg/m.  General: Cooperative, alert, well developed, in no acute distress. HEENT: Conjunctivae and lids unremarkable. Cardiovascular: Regular rhythm.  Lungs: Normal work of breathing. Neurologic: No focal deficits.   Lab Results  Component Value Date   CREATININE 1.01 (H) 01/30/2020   BUN 17 01/30/2020   NA 143 01/30/2020   K 4.3 01/30/2020   CL 104 01/30/2020   CO2 23 01/30/2020   Lab Results  Component Value Date   ALT 18 01/30/2020   AST 18 01/30/2020   ALKPHOS 91 01/30/2020   BILITOT 0.7 01/30/2020   Lab Results  Component Value Date   HGBA1C 6.2 (H) 01/30/2020   HGBA1C 6.0 (H) 10/02/2019   Lab Results  Component Value Date   INSULIN 9.7 01/30/2020   INSULIN 10.6 10/02/2019   INSULIN 20.3 05/11/2019   Lab Results  Component Value Date   TSH 0.997 10/02/2019   Lab Results  Component Value Date   CHOL 114 10/02/2019   HDL 40 10/02/2019   LDLCALC 56 10/02/2019   TRIG 91 10/02/2019   Lab Results  Component Value Date   WBC 6.0 04/26/2019   HGB 12.6 04/26/2019   HCT 40.5 04/26/2019   MCV 96.2 04/26/2019   PLT 171 04/26/2019   No results found for: IRON, TIBC, FERRITIN  Obesity Behavioral Intervention:   Approximately 15 minutes were spent on the discussion below.  ASK: We discussed the diagnosis of obesity with [name] today and [name] agreed to give [place] permission to discuss obesity behavioral modification therapy today.  ASSESS: [name] has the diagnosis of obesity and her BMI today is 33.4. [name] is in the action stage of change.   ADVISE: [name] was educated on the multiple health risks of obesity as well as the benefit of weight loss to improve her health. She was advised of the need for long term treatment and the importance of lifestyle modifications to improve her current health and to decrease her risk of future health problems.  AGREE: Multiple dietary modification options and treatment  options were discussed and [name] agreed to follow the recommendations documented in the above note.  ARRANGE: [name] was educated on the importance of frequent visits to treat obesity as outlined per CMS and USPSTF guidelines and agreed to schedule her next follow up appointment today.  Attestation Statements:   Reviewed by clinician on day of visit: allergies, medications, problem list, medical history, surgical history, family history, social history, and previous encounter notes.  Migdalia Dk, am acting as Location manager for [employer], DO   I have reviewed the above documentation for accuracy and completeness, and I agree with the above. Jearld Lesch, DO

## 2020-02-27 ENCOUNTER — Ambulatory Visit (INDEPENDENT_AMBULATORY_CARE_PROVIDER_SITE_OTHER): Payer: Medicare Other | Admitting: Adult Health

## 2020-02-27 ENCOUNTER — Other Ambulatory Visit: Payer: Self-pay

## 2020-02-27 ENCOUNTER — Encounter (INDEPENDENT_AMBULATORY_CARE_PROVIDER_SITE_OTHER): Payer: Self-pay | Admitting: Adult Health

## 2020-02-27 VITALS — BP 108/70 | HR 62 | Temp 97.6°F | Ht 64.0 in | Wt 190.0 lb

## 2020-02-27 DIAGNOSIS — Z6832 Body mass index (BMI) 32.0-32.9, adult: Secondary | ICD-10-CM | POA: Diagnosis not present

## 2020-02-27 DIAGNOSIS — I10 Essential (primary) hypertension: Secondary | ICD-10-CM

## 2020-02-27 DIAGNOSIS — F5089 Other specified eating disorder: Secondary | ICD-10-CM

## 2020-02-27 DIAGNOSIS — E669 Obesity, unspecified: Secondary | ICD-10-CM | POA: Diagnosis not present

## 2020-02-27 MED ORDER — BUPROPION HCL ER (SR) 150 MG PO TB12: 150.0000 mg | ORAL_TABLET | Freq: Every day | ORAL | 0 refills | Status: DC

## 2020-02-28 DIAGNOSIS — F509 Eating disorder, unspecified: Secondary | ICD-10-CM | POA: Insufficient documentation

## 2020-02-28 NOTE — Progress Notes (Signed)
Chief Complaint:   OBESITY Katelyn Walsh is here to discuss her progress with her obesity treatment plan along with follow-up of her obesity related diagnoses. Katelyn Walsh is on the Category 2 Plan and states she is following her eating plan approximately 40% of the time. Katelyn Walsh states she is walking 20 minutes 1-2 times per week.  Today's visit was #: 20 Starting weight: 224 lbs Starting date: 05/11/2019 Today's weight: 190 lbs Today's date: 02/27/2020 Total lbs lost to date: 34 lbs Total lbs lost since last in-office visit: 4 lbs  Interim History: [place]n Walsh [place] to [place]. She walked 5-6 miles a day while visiting the casino. She has a 4 night trip planned to [place] in mid-January. Katelyn Walsh received ARAMARK Walsh booster COVID-19 vaccine on 11/27/2019. Her goal for 2022 is to reach 170 lbs with a BMI of 29.  Subjective:   1. Other disorder of eating Katelyn Walsh's blood pressure and heart rate are excellent in the office today. She denies a history of seizures. 04/25/2019 EKG-stable. She reports resolution of cravings with buPROPion (WELLBUTRIN SR) 150 MG QD.  2. Essential hypertension Katelyn Walsh's blood pressure and heart rate are excellent in the office today. She is on losartan 100 mg daily. Review: taking medications as instructed, no medication side effects noted, no chest pain on exertion, no dyspnea on exertion, no swelling of ankles. She hasn't been checking ambulatory blood pressure.   BP Readings from Last 3 Encounters:  02/27/20 108/70  02/13/20 114/69  01/30/20 124/71    Assessment/Plan:   1. Other disorder of eating Continue current treatment plan. Refill Wellbutrin for 1 month, as per below.  - buPROPion (WELLBUTRIN SR) 150 MG 12 hr tablet; Take 1 tablet (150 mg total) by mouth daily.  Dispense: 30 tablet; Refill: 0  2. Essential hypertension Katelyn Walsh is working on healthy weight loss and exercise to improve blood pressure control. We will watch for signs of  hypotension as she continues her lifestyle modifications. Practice home blood pressure monitoring and bring log into next office visit. Monitor for symptoms.  3. Class 1 obesity with serious comorbidity and body mass index (BMI) of 32.0 to 32.9 in adult, unspecified obesity type Katelyn Walsh is currently in the action stage of change. As such, her goal is to continue with weight loss efforts. She has agreed to the Category 2 Plan.   Exercise goals: As is  Behavioral modification strategies: increasing lean protein intake, meal planning and cooking strategies, travel eating strategies and planning for success.  Katelyn Walsh has agreed to follow-up with our clinic in 2 weeks. She was informed of the importance of frequent follow-up visits to maximize her success with intensive lifestyle modifications for her multiple health conditions.   Objective:   Blood pressure 108/70, pulse 62, temperature 97.6 F (36.4 C), height 5\' 4"  (1.626 m), weight 190 lb (86.2 kg), SpO2 98 %. Body mass index is 32.61 kg/m.  General: Cooperative, alert, well developed, in no acute distress. HEENT: Conjunctivae and lids unremarkable. Cardiovascular: Regular rhythm.  Lungs: Normal work of breathing. Neurologic: No focal deficits.   Lab Results  Component Value Date   CREATININE 1.01 (H) 01/30/2020   BUN 17 01/30/2020   NA 143 01/30/2020   K 4.3 01/30/2020   CL 104 01/30/2020   CO2 23 01/30/2020   Lab Results  Component Value Date   ALT 18 01/30/2020   AST 18 01/30/2020   ALKPHOS 91 01/30/2020   BILITOT 0.7 01/30/2020   Lab  Results  Component Value Date   HGBA1C 6.2 (H) 01/30/2020   HGBA1C 6.0 (H) 10/02/2019   Lab Results  Component Value Date   INSULIN 9.7 01/30/2020   INSULIN 10.6 10/02/2019   INSULIN 20.3 05/11/2019   Lab Results  Component Value Date   TSH 0.997 10/02/2019   Lab Results  Component Value Date   CHOL 114 10/02/2019   HDL 40 10/02/2019   LDLCALC 56 10/02/2019   TRIG 91 10/02/2019    Lab Results  Component Value Date   WBC 6.0 04/26/2019   HGB 12.6 04/26/2019   HCT 40.5 04/26/2019   MCV 96.2 04/26/2019   PLT 171 04/26/2019   No results found for: IRON, TIBC, FERRITIN  Obesity Behavioral Intervention:   Approximately 15 minutes were spent on the discussion below.  ASK: We discussed the diagnosis of obesity with Katelyn Walsh today and [name] agreed to give [place] permission to discuss obesity behavioral modification therapy today.  ASSESS: [name] has the diagnosis of obesity and her BMI today is 32.7. [name] is in the action stage of change.   ADVISE: [name] was educated on the multiple health risks of obesity as well as the benefit of weight loss to improve her health. She was advised of the need for long term treatment and the importance of lifestyle modifications to improve her current health and to decrease her risk of future health problems.  AGREE: Multiple dietary modification options and treatment options were discussed and [name] agreed to follow the recommendations documented in the above note.  ARRANGE: [name] was educated on the importance of frequent visits to treat obesity as outlined per CMS and USPSTF guidelines and agreed to schedule her next follow up appointment today.  Attestation Statements:   Reviewed by clinician on day of visit: allergies, medications, problem list, medical history, surgical history, family history, social history, and previous encounter notes.  [name], am acting as Location manager for Mina Marble, NP.  I have reviewed the above documentation for accuracy and completeness, and I agree with the above. -  Katelyn Walsh d. Margrete Delude, NP-C

## 2020-03-04 ENCOUNTER — Ambulatory Visit (INDEPENDENT_AMBULATORY_CARE_PROVIDER_SITE_OTHER): Payer: Medicare Other | Admitting: Adult Health

## 2020-03-05 DIAGNOSIS — E1169 Type 2 diabetes mellitus with other specified complication: Secondary | ICD-10-CM | POA: Diagnosis not present

## 2020-03-05 DIAGNOSIS — E559 Vitamin D deficiency, unspecified: Secondary | ICD-10-CM | POA: Diagnosis not present

## 2020-03-05 DIAGNOSIS — Z13820 Encounter for screening for osteoporosis: Secondary | ICD-10-CM | POA: Diagnosis not present

## 2020-03-05 DIAGNOSIS — E785 Hyperlipidemia, unspecified: Secondary | ICD-10-CM | POA: Diagnosis not present

## 2020-03-05 LAB — LIPID PANEL
Cholesterol: 101 (ref 0–200)
HDL: 44 (ref 35–70)
LDL Cholesterol: 36
LDl/HDL Ratio: 0.8
Triglycerides: 105 (ref 40–160)

## 2020-03-11 ENCOUNTER — Telehealth (INDEPENDENT_AMBULATORY_CARE_PROVIDER_SITE_OTHER): Payer: Medicare Other | Admitting: Adult Health

## 2020-03-18 ENCOUNTER — Ambulatory Visit (INDEPENDENT_AMBULATORY_CARE_PROVIDER_SITE_OTHER): Payer: Medicare Other | Admitting: Adult Health

## 2020-03-21 ENCOUNTER — Encounter (INDEPENDENT_AMBULATORY_CARE_PROVIDER_SITE_OTHER): Payer: Self-pay | Admitting: Adult Health

## 2020-03-21 ENCOUNTER — Ambulatory Visit (INDEPENDENT_AMBULATORY_CARE_PROVIDER_SITE_OTHER): Payer: Medicare Other | Admitting: Adult Health

## 2020-03-21 ENCOUNTER — Other Ambulatory Visit: Payer: Self-pay

## 2020-03-21 VITALS — BP 116/70 | HR 60 | Temp 97.6°F | Ht 64.0 in | Wt 191.0 lb

## 2020-03-21 DIAGNOSIS — Z6832 Body mass index (BMI) 32.0-32.9, adult: Secondary | ICD-10-CM | POA: Diagnosis not present

## 2020-03-21 DIAGNOSIS — E669 Obesity, unspecified: Secondary | ICD-10-CM

## 2020-03-21 DIAGNOSIS — F5089 Other specified eating disorder: Secondary | ICD-10-CM

## 2020-03-21 DIAGNOSIS — I10 Essential (primary) hypertension: Secondary | ICD-10-CM | POA: Diagnosis not present

## 2020-03-21 DIAGNOSIS — E66811 Obesity, class 1: Secondary | ICD-10-CM

## 2020-03-21 MED ORDER — BUPROPION HCL ER (SR) 150 MG PO TB12: 150.0000 mg | ORAL_TABLET | Freq: Every day | ORAL | 0 refills | Status: DC
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
## 2020-04-02 DIAGNOSIS — E559 Vitamin D deficiency, unspecified: Secondary | ICD-10-CM | POA: Diagnosis not present

## 2020-04-02 DIAGNOSIS — G2581 Restless legs syndrome: Secondary | ICD-10-CM | POA: Diagnosis not present

## 2020-04-02 DIAGNOSIS — F330 Major depressive disorder, recurrent, mild: Secondary | ICD-10-CM | POA: Diagnosis not present

## 2020-04-02 DIAGNOSIS — J019 Acute sinusitis, unspecified: Secondary | ICD-10-CM | POA: Diagnosis not present

## 2020-04-02 DIAGNOSIS — Z0000 Encounter for general adult medical examination without abnormal findings: Secondary | ICD-10-CM | POA: Diagnosis not present

## 2020-04-02 DIAGNOSIS — E1169 Type 2 diabetes mellitus with other specified complication: Secondary | ICD-10-CM | POA: Diagnosis not present

## 2020-04-02 DIAGNOSIS — E785 Hyperlipidemia, unspecified: Secondary | ICD-10-CM | POA: Diagnosis not present

## 2020-04-02 DIAGNOSIS — F419 Anxiety disorder, unspecified: Secondary | ICD-10-CM | POA: Diagnosis not present

## 2020-04-02 DIAGNOSIS — I10 Essential (primary) hypertension: Secondary | ICD-10-CM | POA: Diagnosis not present

## 2020-04-03 ENCOUNTER — Other Ambulatory Visit: Payer: Self-pay | Admitting: Adult Health

## 2020-04-03 ENCOUNTER — Encounter (INDEPENDENT_AMBULATORY_CARE_PROVIDER_SITE_OTHER): Payer: Self-pay | Admitting: Adult Health

## 2020-04-03 ENCOUNTER — Ambulatory Visit (HOSPITAL_COMMUNITY)
Admission: RE | Admit: 2020-04-03 | Discharge: 2020-04-03 | Disposition: A | Discharge status: Home / Self Care | Payer: Medicare Other | Source: Ambulatory Visit | Attending: Pulmonary Disease | Admitting: Pulmonary Disease

## 2020-04-03 ENCOUNTER — Encounter: Payer: Self-pay | Admitting: Adult Health

## 2020-04-03 ENCOUNTER — Telehealth (INDEPENDENT_AMBULATORY_CARE_PROVIDER_SITE_OTHER): Payer: Medicare Other | Admitting: Adult Health

## 2020-04-03 ENCOUNTER — Other Ambulatory Visit: Payer: Self-pay

## 2020-04-03 DIAGNOSIS — U071 COVID-19: Secondary | ICD-10-CM | POA: Insufficient documentation

## 2020-04-03 DIAGNOSIS — I10 Essential (primary) hypertension: Secondary | ICD-10-CM | POA: Diagnosis not present

## 2020-04-03 DIAGNOSIS — Z6832 Body mass index (BMI) 32.0-32.9, adult: Secondary | ICD-10-CM

## 2020-04-03 DIAGNOSIS — E669 Obesity, unspecified: Secondary | ICD-10-CM

## 2020-04-03 MED ORDER — SOTROVIMAB 500 MG/8ML IV SOLN
500.0000 mg | Freq: Once | INTRAVENOUS | Status: AC
  Administered 2020-04-03: 500 mg via INTRAVENOUS

## 2020-04-03 MED ORDER — DIPHENHYDRAMINE HCL 50 MG/ML IJ SOLN: 50.0000 mg | Freq: Once | INTRAMUSCULAR | Status: DC | PRN

## 2020-04-03 MED ORDER — ALBUTEROL SULFATE HFA 108 (90 BASE) MCG/ACT IN AERS: 2.0000 | INHALATION_SPRAY | Freq: Once | RESPIRATORY_TRACT | Status: DC | PRN

## 2020-04-03 MED ORDER — EPINEPHRINE 0.3 MG/0.3ML IJ SOAJ: 0.3000 mg | Freq: Once | INTRAMUSCULAR | Status: DC | PRN

## 2020-04-03 MED ORDER — SODIUM CHLORIDE 0.9 % IV SOLN: INTRAVENOUS | Status: DC | PRN

## 2020-04-03 MED ORDER — METHYLPREDNISOLONE SODIUM SUCC 125 MG IJ SOLR: 125.0000 mg | Freq: Once | INTRAMUSCULAR | Status: DC | PRN

## 2020-04-03 MED ORDER — FAMOTIDINE IN NACL 20-0.9 MG/50ML-% IV SOLN: 20.0000 mg | Freq: Once | INTRAVENOUS | Status: DC | PRN

## 2020-04-03 NOTE — Progress Notes (Signed)
Patient reviewed Fact Sheet for Patients, Parents, and Caregivers for Emergency Use Authorization (EUA) of sotrovimab for the Treatment of Coronavirus. Patient also reviewed and is agreeable to the estimated cost of treatment. Patient is agreeable to proceed.   

## 2020-04-03 NOTE — Discharge Instructions (Signed)

## 2020-04-03 NOTE — Progress Notes (Signed)
Diagnosis: COVID-19  Physician: Dr. Patrick Wright  Procedure: [facility] Infusion [facility] Med: Sotrovimab infusion - Provided patient with sotrovimab fact sheet for patients, parents, and caregivers prior to infusion.   Complications: No immediate complications noted  Discharge: Discharged home    

## 2020-04-03 NOTE — Progress Notes (Signed)
I connected by phone with Katelyn Walsh on 04/03/2020 at 9:01 AM to discuss the potential use of a new treatment for mild to moderate COVID-19 viral infection in non-hospitalized patients.  This patient is a 70 y.o. female that meets the FDA criteria for Emergency Use Authorization of COVID monoclonal antibody sotrovimab.  Has a (+) direct SARS-CoV-2 viral test result  Has mild or moderate COVID-19   Is NOT hospitalized due to COVID-19  Is within 10 days of symptom onset  Has at least one of the high risk factor(s) for progression to severe COVID-19 and/or hospitalization as defined in EUA.  Specific high risk criteria : Older age (>/= 71 yo) and Cardiovascular disease or hypertension   I have spoken and communicated the following to the patient or parent/caregiver regarding COVID monoclonal antibody treatment:  1. FDA has authorized the emergency use for the treatment of mild to moderate COVID-19 in adults and pediatric patients with positive results of direct SARS-CoV-2 viral testing who are 65 years of age and older weighing at least 40 kg, and who are at high risk for progressing to severe COVID-19 and/or hospitalization.  2. The significant known and potential risks and benefits of COVID monoclonal antibody, and the extent to which such potential risks and benefits are unknown.  3. Information on available alternative treatments and the risks and benefits of those alternatives, including clinical trials.  4. Patients treated with COVID monoclonal antibody should continue to self-isolate and use infection control measures (e.g., wear mask, isolate, social distance, avoid sharing personal items, clean and disinfect "high touch" surfaces, and frequent handwashing) according to CDC guidelines.   5. The patient or parent/caregiver has the option to accept or refuse COVID monoclonal antibody treatment.  After reviewing this information with the patient, the patient has agreed to receive  one of the available covid 19 monoclonal antibodies and will be provided an appropriate fact sheet prior to infusion. Scot Dock, NP 04/03/2020 9:01 AM

## 2020-04-04 NOTE — Progress Notes (Signed)
TeleHealth Visit:  Due to the COVID-19 pandemic, this visit was completed with telemedicine (audio/video) technology to reduce patient and provider exposure as well as to preserve personal protective equipment.   Katelyn Walsh has verbally consented to this TeleHealth visit. The patient is located at home, the provider is located at the [facility] office. The participants in this visit include the listed provider and patient. The visit was conducted today via video.   Chief Complaint: OBESITY Katelyn Walsh is here to discuss her progress with her obesity treatment plan along with follow-up of her obesity related diagnoses. Katelyn Walsh is on the Category 2 Plan and states she is following her eating plan approximately 50% of the time. Katelyn Walsh states she is doing 0 minutes 0 times per week.  Today's visit was #: 22 Starting weight: 224 lbs Starting date: 05/11/2019  Interim History: Katelyn Walsh was exposed to COVID-19 on a recent trip to [facility]. She tested with 2 home antigen tests yesterday. The first test was negative and the second test was positive (very faint positive line). She is experiencing a non-productive cough, sinus pressure, frontal headache, nasal drainage, and pharyngitis. She has been scheduled for monoclonal infusion therapy today at 1230.  Subjective:   1. COVID-19 Positive home antigen test 04/02/2020. Pt is currently experiencing a non-productive cough, sinus pressure, frontal headache, nasal drainage, and pharyngitis. She denies shortness of breath or chest pain. She is scheduled for monoclonal infusion therapy today and is encouraged to keep appointment.   2. Essential hypertension Pt denies acute cardiac symptoms. She is acutely ill with COVID-19. She is on losartan 100 mg daily.   BP Readings from Last 3 Encounters:  04/03/20 125/74  03/21/20 116/70  02/27/20 108/70    Assessment/Plan:   1. COVID-19 Undergo monoclonal antibody infusion therapy today. If Red  Flag symptoms develop, seek immediate medical assistance. Pt verbalized understanding and agreement. Increase fluids and rest.   2. Essential hypertension Katelyn Walsh is working on healthy weight loss and exercise to improve blood pressure control. We will watch for signs of hypotension as she continues her lifestyle modifications. Continue losartan 100 mg daily.  3. Class 1 obesity with serious comorbidity and body mass index (BMI) of 32.0 to 32.9 in adult, unspecified obesity type Katelyn Walsh is currently in the action stage of change. As such, her goal is to continue with weight loss efforts. She has agreed to the Category 2 Plan.   Increase fluids, rest, eat well balanced, easily digestible meals and follow CDC isolation protocol. Reviewed at length with pt.  Exercise goals: No exercise has been prescribed at this time.  Behavioral modification strategies: Increase water, rest - acutely ill with COVID-19  Katelyn Walsh has agreed to follow-up with our clinic in 2 weeks. She was informed of the importance of frequent follow-up visits to maximize her success with intensive lifestyle modifications for her multiple health conditions.  Objective:   VITALS: Per patient if applicable, see vitals. GENERAL: Alert and in no acute distress. CARDIOPULMONARY: No increased WOB. Speaking in clear sentences.  PSYCH: Pleasant and cooperative. Speech normal rate and rhythm. Affect is appropriate. Insight and judgement are appropriate. Attention is focused, linear, and appropriate.  NEURO: Oriented as arrived to appointment on time with no prompting.   Lab Results  Component Value Date   CREATININE 1.01 (H) 01/30/2020   BUN 17 01/30/2020   NA 143 01/30/2020   K 4.3 01/30/2020   CL 104 01/30/2020   CO2 23 01/30/2020  Lab Results  Component Value Date   ALT 18 01/30/2020   AST 18 01/30/2020   ALKPHOS 91 01/30/2020   BILITOT 0.7 01/30/2020   Lab Results  Component Value Date   HGBA1C 6.2 (H) 01/30/2020    HGBA1C 6.0 (H) 10/02/2019   Lab Results  Component Value Date   INSULIN 9.7 01/30/2020   INSULIN 10.6 10/02/2019   INSULIN 20.3 05/11/2019   Lab Results  Component Value Date   TSH 0.997 10/02/2019   Lab Results  Component Value Date   CHOL 114 10/02/2019   HDL 40 10/02/2019   LDLCALC 56 10/02/2019   TRIG 91 10/02/2019   Lab Results  Component Value Date   WBC 6.0 04/26/2019   HGB 12.6 04/26/2019   HCT 40.5 04/26/2019   MCV 96.2 04/26/2019   PLT 171 04/26/2019   No results found for: IRON, TIBC, FERRITIN  Attestation Statements:   Reviewed by clinician on day of visit: allergies, medications, problem list, medical history, surgical history, family history, social history, and previous encounter notes.  Time spent on visit including pre-visit chart review and post-visit charting and care was 35 minutes.   [name], am acting as Location manager for Mina Marble, NP.  I have reviewed the above documentation for accuracy and completeness, and I agree with the above. - Katelyn Walsh d. Katelyn Deshong, NP-C

## 2020-04-08 DIAGNOSIS — U071 COVID-19: Secondary | ICD-10-CM | POA: Insufficient documentation

## 2020-04-18 ENCOUNTER — Other Ambulatory Visit: Payer: Self-pay

## 2020-04-18 ENCOUNTER — Encounter (INDEPENDENT_AMBULATORY_CARE_PROVIDER_SITE_OTHER): Payer: Self-pay | Admitting: Adult Health

## 2020-04-18 ENCOUNTER — Ambulatory Visit (INDEPENDENT_AMBULATORY_CARE_PROVIDER_SITE_OTHER): Payer: Medicare Other | Admitting: Adult Health

## 2020-04-18 VITALS — BP 116/69 | HR 77 | Temp 97.8°F | Ht 64.0 in | Wt 192.0 lb

## 2020-04-18 DIAGNOSIS — F5089 Other specified eating disorder: Secondary | ICD-10-CM | POA: Diagnosis not present

## 2020-04-18 DIAGNOSIS — U071 COVID-19: Secondary | ICD-10-CM

## 2020-04-18 DIAGNOSIS — I10 Essential (primary) hypertension: Secondary | ICD-10-CM

## 2020-04-18 DIAGNOSIS — Z6833 Body mass index (BMI) 33.0-33.9, adult: Secondary | ICD-10-CM

## 2020-04-18 DIAGNOSIS — E669 Obesity, unspecified: Secondary | ICD-10-CM

## 2020-04-18 MED ORDER — BUPROPION HCL ER (SR) 150 MG PO TB12: 150.0000 mg | ORAL_TABLET | Freq: Every day | ORAL | 0 refills | Status: DC

## 2020-04-22 NOTE — Progress Notes (Signed)
Chief Complaint:   OBESITY Floreen is here to discuss her progress with her obesity treatment plan along with follow-up of her obesity related diagnoses. Eva is on the Category 2 Plan and states she is following her eating plan approximately 44% of the time. Kaidance states she is doing 0 minutes 0 times per week.  Today's visit was #: 23 Starting weight: 224 lbs Starting date: 05/11/2019 Today's weight: 192 lbs Today's date: 04/18/2020 Total lbs lost to date: 32 lbs Total lbs lost since last in-office visit: 0  Interim History: Jamari had a positive home antigen COVID test and received monoclonal antibody infusion 04/03/2020. She ran out of food on plan due to isolation timing. She plans on going to grocery today after her appointment.  Subjective:   1. Essential hypertension Jozlyn's BP and heart rate are at goal today. She is on losartan 100 mg daily.  BP Readings from Last 3 Encounters:  04/18/20 116/69  04/03/20 125/74  03/21/20 116/70    2. Other disorder of eating BP and heart rate are at goal. [name] reports a decrease in cravings with daily Wellbutrin SR 150 mg.   3. COVID-19 [name] had a positive home antigen COVID-19 test. She received monoclonal antibody infusion 04/03/2020. She reports the only lingering symptom is a headache.  Assessment/Plan:   1. Essential hypertension Sianne is working on healthy weight loss and exercise to improve blood pressure control. We will watch for signs of hypotension as she continues her lifestyle modifications. Continue losartan and category 2 meal plan.  2. Other disorder of eating Behavior modification techniques were discussed today to help Yarisbel deal with her emotional/non-hunger eating behaviors.  Orders and follow up as documented in patient record.   - buPROPion (WELLBUTRIN SR) 150 MG 12 hr tablet; Take 1 tablet (150 mg total) by mouth daily.  Dispense: 30 tablet; Refill: 0  3. COVID-19 Continue 3 Ws  4. Class 1 obesity with  serious comorbidity and body mass index (BMI) of 33.0 to 33.9 in adult, unspecified obesity type Judyann is currently in the action stage of change. As such, her goal is to continue with weight loss efforts. She has agreed to the Category 2 Plan.   Exercise goals: No exercise has been prescribed at this time.  Behavioral modification strategies: increasing lean protein intake, decreasing simple carbohydrates, meal planning and cooking strategies and planning for success.  Tiyana has agreed to follow-up with our clinic in 2 weeks. She was informed of the importance of frequent follow-up visits to maximize her success with intensive lifestyle modifications for her multiple health conditions.   Objective:   Blood pressure 116/69, pulse 77, temperature 97.8 F (36.6 C), height 5\' 4"  (1.626 m), weight 192 lb (87.1 kg), SpO2 99 %. Body mass index is 32.96 kg/m.  General: Cooperative, alert, well developed, in no acute distress. HEENT: Conjunctivae and lids unremarkable. Cardiovascular: Regular rhythm.  Lungs: Normal work of breathing. Neurologic: No focal deficits.   Lab Results  Component Value Date   CREATININE 1.01 (H) 01/30/2020   BUN 17 01/30/2020   NA 143 01/30/2020   K 4.3 01/30/2020   CL 104 01/30/2020   CO2 23 01/30/2020   Lab Results  Component Value Date   ALT 18 01/30/2020   AST 18 01/30/2020   ALKPHOS 91 01/30/2020   BILITOT 0.7 01/30/2020   Lab Results  Component Value Date   HGBA1C 6.2 (H) 01/30/2020   HGBA1C 6.0 (H) 10/02/2019   Lab Results  Component Value Date   INSULIN 9.7 01/30/2020   INSULIN 10.6 10/02/2019   INSULIN 20.3 05/11/2019   Lab Results  Component Value Date   TSH 0.997 10/02/2019   Lab Results  Component Value Date   CHOL 114 10/02/2019   HDL 40 10/02/2019   LDLCALC 56 10/02/2019   TRIG 91 10/02/2019   Lab Results  Component Value Date   WBC 6.0 04/26/2019   HGB 12.6 04/26/2019   HCT 40.5 04/26/2019   MCV 96.2 04/26/2019   PLT  171 04/26/2019   No results found for: IRON, TIBC, FERRITIN  Obesity Behavioral Intervention:   Approximately 15 minutes were spent on the discussion below.  ASK: We discussed the diagnosis of obesity with [name] today and Tenleigh agreed to give [place] permission to discuss obesity behavioral modification therapy today.  ASSESS: Assyria has the diagnosis of obesity and her BMI today is 33.1. Mckynna is in the action stage of change.   ADVISE: Ambrea was educated on the multiple health risks of obesity as well as the benefit of weight loss to improve her health. She was advised of the need for long term treatment and the importance of lifestyle modifications to improve her current health and to decrease her risk of future health problems.  AGREE: Multiple dietary modification options and treatment options were discussed and Tuwana agreed to follow the recommendations documented in the above note.  ARRANGE: Lamyra was educated on the importance of frequent visits to treat obesity as outlined per CMS and USPSTF guidelines and agreed to schedule her next follow up appointment today.  Attestation Statements:   Reviewed by clinician on day of visit: allergies, medications, problem list, medical history, surgical history, family history, social history, and previous encounter notes.  [name], am acting as Location manager for Mina Marble, NP.  I have reviewed the above documentation for accuracy and completeness, and I agree with the above. -  Dazia Lippold d. Marni Franzoni, NP-C

## 2020-05-02 ENCOUNTER — Ambulatory Visit (INDEPENDENT_AMBULATORY_CARE_PROVIDER_SITE_OTHER): Payer: Medicare Other | Admitting: Adult Health

## 2020-05-08 ENCOUNTER — Encounter (INDEPENDENT_AMBULATORY_CARE_PROVIDER_SITE_OTHER): Payer: Self-pay | Admitting: Adult Health

## 2020-05-08 ENCOUNTER — Telehealth (INDEPENDENT_AMBULATORY_CARE_PROVIDER_SITE_OTHER): Payer: Medicare Other | Admitting: Adult Health

## 2020-05-08 ENCOUNTER — Other Ambulatory Visit: Payer: Self-pay

## 2020-05-08 DIAGNOSIS — Z6832 Body mass index (BMI) 32.0-32.9, adult: Secondary | ICD-10-CM | POA: Diagnosis not present

## 2020-05-08 DIAGNOSIS — R197 Diarrhea, unspecified: Secondary | ICD-10-CM | POA: Diagnosis not present

## 2020-05-08 DIAGNOSIS — E669 Obesity, unspecified: Secondary | ICD-10-CM

## 2020-05-08 DIAGNOSIS — F5089 Other specified eating disorder: Secondary | ICD-10-CM | POA: Diagnosis not present

## 2020-05-08 MED ORDER — BUPROPION HCL ER (SR) 150 MG PO TB12: 150.0000 mg | ORAL_TABLET | Freq: Every day | ORAL | 0 refills | Status: DC

## 2020-05-09 NOTE — Progress Notes (Signed)
TeleHealth Visit:  Due to the COVID-19 pandemic, this visit was completed with telemedicine (audio/video) technology to reduce patient and provider exposure as well as to preserve personal protective equipment.   Gayle has verbally consented to this TeleHealth visit. The patient is located at home, the provider is located at the [facility] office. The participants in this visit include the listed provider and patient. The visit was conducted today via video.   Chief Complaint: OBESITY Kashira is here to discuss her progress with her obesity treatment plan along with follow-up of her obesity related diagnoses. Kimiyah is on the Category 2 Plan and states she is following her eating plan approximately 60% of the time. Marvena states she is doing 0 minutes 0 times per week.  Today's visit was #: 24 Starting weight: 224 lbs Starting date: 05/11/2019  Interim History: Avenell is currently experiencing diarrhea and low grade fever- 100.56F oral was the highest yesterday.  She reports normal temp this morning but is still experiencing fatigue and loose stools. She denies nausea, vomiting, SOB, or chest pain.  Subjective:   1. Diarrhea, unspecified type Alaia has been experiencing loose stool for 24 hours. She denies hematochezia or abdominal pain.   2. Other disorder of eating Sreshta's BP and heart rate are excellent at OV. She denies history of seizure. She does not check ambulatory BP. She is on Wellbutrin SR 150 mg QD. She will occasionally experience cravings in the evening.  Assessment/Plan:   1. Diarrhea, unspecified type Increase fluid, BRAT diet. Remain home with acute symptoms.  2. Other disorder of eating Behavior modification techniques were discussed today to help Favor deal with her emotional/non-hunger eating behaviors.  Orders and follow up as documented in patient record. Increase protein at evening meal. Keep snack cal (200 cal/day).  - buPROPion (WELLBUTRIN SR)  150 MG 12 hr tablet; Take 1 tablet (150 mg total) by mouth daily.  Dispense: 30 tablet; Refill: 0  3. Class 1 obesity with serious comorbidity and body mass index (BMI) of 32.0 to 32.9 in adult, unspecified obesity type Krisanne is currently in the action stage of change. As such, her goal is to continue with weight loss efforts. She has agreed to the Category 2 Plan.   Bring January 2022 labs from PCP.  Exercise goals: No exercise has been prescribed at this time.  Behavioral modification strategies: increasing lean protein intake, no skipping meals, meal planning and cooking strategies and planning for success.  Blandina has agreed to follow-up with our clinic in 2 weeks. She was informed of the importance of frequent follow-up visits to maximize her success with intensive lifestyle modifications for her multiple health conditions.  Objective:   VITALS: Per patient if applicable, see vitals. GENERAL: Alert and in no acute distress. CARDIOPULMONARY: No increased WOB. Speaking in clear sentences.  PSYCH: Pleasant and cooperative. Speech normal rate and rhythm. Affect is appropriate. Insight and judgement are appropriate. Attention is focused, linear, and appropriate.  NEURO: Oriented as arrived to appointment on time with no prompting.   Lab Results  Component Value Date   CREATININE 1.01 (H) 01/30/2020   BUN 17 01/30/2020   NA 143 01/30/2020   K 4.3 01/30/2020   CL 104 01/30/2020   CO2 23 01/30/2020   Lab Results  Component Value Date   ALT 18 01/30/2020   AST 18 01/30/2020   ALKPHOS 91 01/30/2020   BILITOT 0.7 01/30/2020   Lab Results  Component Value Date  HGBA1C 6.2 (H) 01/30/2020   HGBA1C 6.0 (H) 10/02/2019   Lab Results  Component Value Date   INSULIN 9.7 01/30/2020   INSULIN 10.6 10/02/2019   INSULIN 20.3 05/11/2019   Lab Results  Component Value Date   TSH 0.997 10/02/2019   Lab Results  Component Value Date   CHOL 114 10/02/2019   HDL 40 10/02/2019    LDLCALC 56 10/02/2019   TRIG 91 10/02/2019   Lab Results  Component Value Date   WBC 6.0 04/26/2019   HGB 12.6 04/26/2019   HCT 40.5 04/26/2019   MCV 96.2 04/26/2019   PLT 171 04/26/2019   No results found for: IRON, TIBC, FERRITIN  Attestation Statements:   Reviewed by clinician on day of visit: allergies, medications, problem list, medical history, surgical history, family history, social history, and previous encounter notes.  Time spent on visit including pre-visit chart review and post-visit charting and care was 34 minutes.   [name], am acting as Location manager for Mina Marble, NP.  I have reviewed the above documentation for accuracy and completeness, and I agree with the above. - Joffrey Kerce d. Madelina Sanda, NP-C

## 2020-05-22 ENCOUNTER — Ambulatory Visit (INDEPENDENT_AMBULATORY_CARE_PROVIDER_SITE_OTHER): Payer: Self-pay | Admitting: Adult Health

## 2020-05-28 ENCOUNTER — Ambulatory Visit (INDEPENDENT_AMBULATORY_CARE_PROVIDER_SITE_OTHER): Payer: Medicare Other | Admitting: Family Medicine

## 2020-05-30 DIAGNOSIS — H5203 Hypermetropia, bilateral: Secondary | ICD-10-CM | POA: Diagnosis not present

## 2020-05-30 DIAGNOSIS — H2513 Age-related nuclear cataract, bilateral: Secondary | ICD-10-CM | POA: Diagnosis not present

## 2020-05-30 DIAGNOSIS — H10413 Chronic giant papillary conjunctivitis, bilateral: Secondary | ICD-10-CM | POA: Diagnosis not present

## 2020-06-05 ENCOUNTER — Ambulatory Visit (INDEPENDENT_AMBULATORY_CARE_PROVIDER_SITE_OTHER): Payer: Medicare Other | Admitting: Family Medicine

## 2020-06-05 ENCOUNTER — Other Ambulatory Visit: Payer: Self-pay

## 2020-06-05 ENCOUNTER — Telehealth (INDEPENDENT_AMBULATORY_CARE_PROVIDER_SITE_OTHER): Payer: Medicare Other | Admitting: Family Medicine

## 2020-06-05 DIAGNOSIS — Z6838 Body mass index (BMI) 38.0-38.9, adult: Secondary | ICD-10-CM

## 2020-06-05 DIAGNOSIS — R7303 Prediabetes: Secondary | ICD-10-CM | POA: Diagnosis not present

## 2020-06-10 NOTE — Progress Notes (Signed)
TeleHealth Visit:  Due to the COVID-19 pandemic, this visit was completed with telemedicine (audio/video) technology to reduce patient and provider exposure as well as to preserve personal protective equipment.   Katelyn Walsh has verbally consented to this TeleHealth visit. The patient is located at home, the provider is located at the [facility] office. The participants in this visit include the listed provider and patient. The visit was conducted today via video.   Chief Complaint: OBESITY Katelyn Walsh is here to discuss her progress with her obesity treatment plan along with follow-up of her obesity related diagnoses. Katelyn Walsh is on the Katelyn 2 Plan and states she is following her eating plan approximately 50% of the time. Katelyn Walsh states she is not currently exercising.  Today's visit was #: 25 Starting weight: 224 lbs Starting date: 05/11/2019  Interim History: Katelyn Walsh is currently not feeling well. She possibly caught a GI virus from her nephew and so she is currently home. She also is dealing with allergies. Prior to falling ill with sinus infection and getting Z-pack, she was trying to exercise. She hasn't gotten out and walked recently, given allergies. She hasn't really felt like going to store. She is doing [employer] and string cheese for snacks.  Subjective:   1. Pre-diabetes Katelyn Walsh's last A1c was 6.2 and insulin level 9.7. She is not on medication. She reports carb cravings.  Lab Results  Component Value Date   HGBA1C 6.2 (H) 01/30/2020   Lab Results  Component Value Date   INSULIN 9.7 01/30/2020   INSULIN 10.6 10/02/2019   INSULIN 20.3 05/11/2019    Assessment/Plan:   1. Pre-diabetes Katelyn Walsh will continue to work on weight loss, exercise, and decreasing simple carbohydrates to help decrease the risk of diabetes. She is to bring labs to next appointment with Katelyn Marble, NP.  2. Class 2 severe obesity with serious comorbidity and body mass index (BMI) of 38.0 to  38.9 in adult, unspecified obesity type Katelyn Walsh) Katelyn Walsh is currently in the action stage of change. As such, her goal is to continue with weight loss efforts. She has agreed to the Katelyn Walsh.   Exercise goals: No exercise has been prescribed at this time.  Behavioral modification strategies: increasing lean protein intake, meal planning and cooking strategies and keeping healthy foods in the home.  Katelyn Walsh has agreed to follow-up with our clinic in 2-3 weeks. She was informed of the importance of frequent follow-up visits to maximize her success with intensive lifestyle modifications for her multiple health conditions.  Objective:   VITALS: Per patient if applicable, see vitals. GENERAL: Alert and in no acute distress. CARDIOPULMONARY: No increased WOB. Speaking in clear sentences.  PSYCH: Pleasant and cooperative. Speech normal rate and rhythm. Affect is appropriate. Insight and judgement are appropriate. Attention is focused, linear, and appropriate.  NEURO: Oriented as arrived to appointment on time with no prompting.   Lab Results  Component Value Date   CREATININE 1.01 (H) 01/30/2020   BUN 17 01/30/2020   NA 143 01/30/2020   K 4.3 01/30/2020   CL 104 01/30/2020   CO2 23 01/30/2020   Lab Results  Component Value Date   ALT 18 01/30/2020   AST 18 01/30/2020   ALKPHOS 91 01/30/2020   BILITOT 0.7 01/30/2020   Lab Results  Component Value Date   HGBA1C 6.2 (H) 01/30/2020   HGBA1C 6.0 (H) 10/02/2019   Lab Results  Component Value Date   INSULIN 9.7 01/30/2020  INSULIN 10.6 10/02/2019   INSULIN 20.3 05/11/2019   Lab Results  Component Value Date   TSH 0.997 10/02/2019   Lab Results  Component Value Date   CHOL 114 10/02/2019   HDL 40 10/02/2019   LDLCALC 56 10/02/2019   TRIG 91 10/02/2019   Lab Results  Component Value Date   WBC 6.0 04/26/2019   HGB 12.6 04/26/2019   HCT 40.5 04/26/2019   MCV 96.2 04/26/2019   PLT 171 04/26/2019   No  results found for: IRON, TIBC, FERRITIN  Attestation Statements:   Reviewed by clinician on day of visit: allergies, medications, problem list, medical history, surgical history, family history, social history, and previous encounter notes.  Time spent on visit including pre-visit chart review and post-visit charting and care was 15 minutes.   [name], am acting as transcriptionist for [name], MD.   I have reviewed the above documentation for accuracy and completeness, and I agree with the above. - Jinny Blossom, MD

## 2020-06-13 ENCOUNTER — Other Ambulatory Visit: Payer: Self-pay

## 2020-06-13 ENCOUNTER — Ambulatory Visit (INDEPENDENT_AMBULATORY_CARE_PROVIDER_SITE_OTHER): Payer: Medicare Other

## 2020-06-13 ENCOUNTER — Encounter: Payer: Self-pay | Admitting: Podiatry

## 2020-06-13 ENCOUNTER — Ambulatory Visit (INDEPENDENT_AMBULATORY_CARE_PROVIDER_SITE_OTHER): Payer: Medicare Other | Admitting: Podiatry

## 2020-06-13 DIAGNOSIS — M21619 Bunion of unspecified foot: Secondary | ICD-10-CM | POA: Diagnosis not present

## 2020-06-13 DIAGNOSIS — L84 Corns and callosities: Secondary | ICD-10-CM

## 2020-06-18 NOTE — Progress Notes (Signed)
Subjective:   Patient ID: Katelyn Walsh, female   DOB: 70 y.o.   MRN: [id number]   HPI Patient presents stating she is having a painful lesion on the outside of her left foot states it is hard to walk on and she is not sure where it came from.  States its been present recently and has gotten gradually more aggravating and patient does not smoke and likes to be active   Review of Systems  All other systems reviewed and are negative.       Objective:  Physical Exam Vitals and nursing note reviewed.  Constitutional:      Appearance: She is well-developed.  Pulmonary:     Effort: Pulmonary effort is normal.  Musculoskeletal:        General: Normal range of motion.  Skin:    General: Skin is warm.  Neurological:     Mental Status: She is alert.     Neurovascular status intact muscle strength adequate range of motion adequate.  Patient is found to have good digital perfusion well oriented and is found to have a thick plantar keratotic lesion fifth metatarsal head left that is painful when pressed and makes walking difficult.  Patient is found to have have good digital perfusion at this current time       Assessment:  Plantarflexed metatarsal with painful plantar keratotic lesion painful to walk on     Plan:  H&P condition reviewed and sharp sterile debridement accomplished with no iatrogenic bleeding.  Applied padding to the area and advised on shoe gear modifications and reappoint as needed  X-rays indicate that there is lesion formation moderately deep in its intensity with no indication of arthritis stress fracture

## 2020-06-20 DIAGNOSIS — L299 Pruritus, unspecified: Secondary | ICD-10-CM | POA: Diagnosis not present

## 2020-06-20 DIAGNOSIS — J3081 Allergic rhinitis due to animal (cat) (dog) hair and dander: Secondary | ICD-10-CM | POA: Diagnosis not present

## 2020-06-20 DIAGNOSIS — J3089 Other allergic rhinitis: Secondary | ICD-10-CM | POA: Diagnosis not present

## 2020-06-20 DIAGNOSIS — J301 Allergic rhinitis due to pollen: Secondary | ICD-10-CM | POA: Diagnosis not present

## 2020-07-01 ENCOUNTER — Ambulatory Visit (INDEPENDENT_AMBULATORY_CARE_PROVIDER_SITE_OTHER): Payer: Medicare Other | Admitting: Adult Health

## 2020-07-01 ENCOUNTER — Encounter (INDEPENDENT_AMBULATORY_CARE_PROVIDER_SITE_OTHER): Payer: Self-pay | Admitting: Adult Health

## 2020-07-01 ENCOUNTER — Other Ambulatory Visit: Payer: Self-pay

## 2020-07-01 VITALS — BP 148/74 | HR 57 | Temp 98.0°F | Ht 64.0 in | Wt 204.0 lb

## 2020-07-01 DIAGNOSIS — R7303 Prediabetes: Secondary | ICD-10-CM

## 2020-07-01 DIAGNOSIS — Z6835 Body mass index (BMI) 35.0-35.9, adult: Secondary | ICD-10-CM

## 2020-07-01 DIAGNOSIS — F5089 Other specified eating disorder: Secondary | ICD-10-CM

## 2020-07-01 MED ORDER — BUPROPION HCL ER (SR) 150 MG PO TB12
150.0000 mg | ORAL_TABLET | Freq: Every day | ORAL | 0 refills | Status: DC
Start: 2020-07-01 — End: 2020-08-08

## 2020-07-02 ENCOUNTER — Telehealth (INDEPENDENT_AMBULATORY_CARE_PROVIDER_SITE_OTHER): Payer: Medicare Other | Admitting: Family Medicine

## 2020-07-02 NOTE — Progress Notes (Signed)
Chief Complaint:   OBESITY Katelyn Walsh is here to discuss her progress with her obesity treatment plan along with follow-up of her obesity related diagnoses. Katelyn Walsh is on the Category 2 Plan + 100 and states she is following her eating plan approximately 20% of the time. Katelyn Walsh states she is not currently exercising.  Today's visit was #: 26 Starting weight: 224 lbs Starting date: 05/11/2019 Today's weight: 204 lbs Today's date: 07/01/2020 Total lbs lost to date: 20 Total lbs lost since last in-office visit: 0  Interim History: Katelyn Walsh traveled to [facility] for 2 weeks and did not follow category 2 plan at all. She traveled to [facility] for 4 days and estimates to follow category 2 less than 40% of the time while on that vacation. She is discouraged to have gained 12 lbs since the last OV. She feels that more regular in office f/u will assist with plan compliance and positive results.  Subjective:   1. Pre-diabetes 01/30/2020 A1c 6.2 with elevated BG 118 and insulin level 9.7. Katelyn Walsh has never been on any BG lowering agents. Discussed risks/benefits of GLP-1 therapy- pt declined at this time.  Lab Results  Component Value Date   HGBA1C 6.2 (H) 01/30/2020   Lab Results  Component Value Date   INSULIN 9.7 01/30/2020   INSULIN 10.6 10/02/2019   INSULIN 20.3 05/11/2019    2. Other disorder of eating BP/HR excellent at OV. Katelyn Walsh has been off Wellbutrin) for over 2 weeks and is experiencing increased cravings.  She denies SI/HI. She denies hx of seizures.  Assessment/Plan:   1. Pre-diabetes Artina will continue to work on weight loss, exercise, and decreasing simple carbohydrates to help decrease the risk of diabetes. Increase protein and daily walking.  2. Other disorder of eating Behavior modification techniques were discussed today to help Katelyn Walsh deal with her emotional/non-hunger eating behaviors.  Orders and follow up as documented in patient record.   - buPROPion  (WELLBUTRIN SR) 150 MG 12 hr tablet; Take 1 tablet (150 mg total) by mouth daily.  Dispense: 30 tablet; Refill: 0  3. Class 2 severe obesity with serious comorbidity and body mass index (BMI) of 35.0 to 35.9 in adult, unspecified obesity type (HCC)  Katelyn Walsh is currently in the action stage of change. As such, her goal is to continue with weight loss efforts. She has agreed to the Category 2 Plan + 100 calories.   Bring recent labs from Dr. Ardeth Perfect at [facility].  Exercise goals: Increase daily walking  Behavioral modification strategies: increasing lean protein intake, decreasing simple carbohydrates, decreasing eating out, no skipping meals, meal planning and cooking strategies, better snacking choices and planning for success.  Katelyn Walsh has agreed to follow-up with our clinic fasting in 2 weeks with Dr. Owens Shark. She was informed of the importance of frequent follow-up visits to maximize her success with intensive lifestyle modifications for her multiple health conditions.   Objective:   Blood pressure (!) 148/74, pulse (!) 57, temperature 98 F (36.7 C), height 5\' 4"  (1.626 m), weight 204 lb (92.5 kg), SpO2 98 %. Body mass index is 35.02 kg/m.  General: Cooperative, alert, well developed, in no acute distress. HEENT: Conjunctivae and lids unremarkable. Cardiovascular: Regular rhythm.  Lungs: Normal work of breathing. Neurologic: No focal deficits.   Lab Results  Component Value Date   CREATININE 1.01 (H) 01/30/2020   BUN 17 01/30/2020   NA 143 01/30/2020   K 4.3 01/30/2020   CL 104 01/30/2020  CO2 23 01/30/2020   Lab Results  Component Value Date   ALT 18 01/30/2020   AST 18 01/30/2020   ALKPHOS 91 01/30/2020   BILITOT 0.7 01/30/2020   Lab Results  Component Value Date   HGBA1C 6.2 (H) 01/30/2020   HGBA1C 6.0 (H) 10/02/2019   Lab Results  Component Value Date   INSULIN 9.7 01/30/2020   INSULIN 10.6 10/02/2019   INSULIN 20.3 05/11/2019   Lab Results   Component Value Date   TSH 0.997 10/02/2019   Lab Results  Component Value Date   CHOL 114 10/02/2019   HDL 40 10/02/2019   LDLCALC 56 10/02/2019   TRIG 91 10/02/2019   Lab Results  Component Value Date   WBC 6.0 04/26/2019   HGB 12.6 04/26/2019   HCT 40.5 04/26/2019   MCV 96.2 04/26/2019   PLT 171 04/26/2019   No results found for: IRON, TIBC, FERRITIN  Obesity Behavioral Intervention:   Approximately 15 minutes were spent on the discussion below.  ASK: We discussed the diagnosis of obesity with Katelyn Walsh today and Katelyn Walsh agreed to give [place] permission to discuss obesity behavioral modification therapy today.  ASSESS: Katelyn Walsh has the diagnosis of obesity and her BMI today is 35.1. Katelyn Walsh is in the action stage of change.   ADVISE: Katelyn Walsh was educated on the multiple health risks of obesity as well as the benefit of weight loss to improve her health. She was advised of the need for long term treatment and the importance of lifestyle modifications to improve her current health and to decrease her risk of future health problems.  AGREE: Multiple dietary modification options and treatment options were discussed and Katelyn Walsh agreed to follow the recommendations documented in the above note.  ARRANGE: Katelyn Walsh was educated on the importance of frequent visits to treat obesity as outlined per CMS and USPSTF guidelines and agreed to schedule her next follow up appointment today.  Attestation Statements:   Reviewed by clinician on day of visit: allergies, medications, problem list, medical history, surgical history, family history, social history, and previous encounter notes.  [name], CMA, am acting as transcriptionist for Mina Marble, NP.  I have reviewed the above documentation for accuracy and completeness, and I agree with the above. -  Katelyn Walsh Cara d. Praneeth Bussey, NP-C

## 2020-07-10 ENCOUNTER — Other Ambulatory Visit: Payer: Self-pay

## 2020-07-10 ENCOUNTER — Other Ambulatory Visit: Payer: Self-pay | Admitting: *Deleted

## 2020-07-10 NOTE — Patient Outreach (Signed)
[facility]) Care Management  07/10/2020  Katelyn Walsh [birth year] [phone]  Telephone outreach for care coordination. Katelyn Walsh was assigned to this NP for transition from [facility] to [facility], however, new information has been received that [facility] will have an [facility]STREAM RN for care management services. I have left a message on Katelyn Walsh voice mail today since she was scheduled for this call. Advised she may call me back if she has any questions.  [name]. [name], MSN, [facility] Gerontological Nurse Practitioner [facility] Care Management [phone]

## 2020-07-18 ENCOUNTER — Ambulatory Visit (INDEPENDENT_AMBULATORY_CARE_PROVIDER_SITE_OTHER): Payer: Medicare Other | Admitting: Bariatrics

## 2020-08-07 ENCOUNTER — Ambulatory Visit (INDEPENDENT_AMBULATORY_CARE_PROVIDER_SITE_OTHER): Payer: Medicare Other | Admitting: Family Medicine

## 2020-08-08 ENCOUNTER — Telehealth (INDEPENDENT_AMBULATORY_CARE_PROVIDER_SITE_OTHER): Payer: Medicare Other | Admitting: Family Medicine

## 2020-08-08 ENCOUNTER — Other Ambulatory Visit: Payer: Self-pay

## 2020-08-08 DIAGNOSIS — F5089 Other specified eating disorder: Secondary | ICD-10-CM

## 2020-08-08 DIAGNOSIS — Z6838 Body mass index (BMI) 38.0-38.9, adult: Secondary | ICD-10-CM | POA: Diagnosis not present

## 2020-08-08 DIAGNOSIS — R7303 Prediabetes: Secondary | ICD-10-CM

## 2020-08-08 MED ORDER — BUPROPION HCL ER (SR) 150 MG PO TB12: 150.0000 mg | ORAL_TABLET | Freq: Every day | ORAL | 0 refills | Status: DC

## 2020-08-19 NOTE — Progress Notes (Signed)
TeleHealth Visit:  Due to the COVID-19 pandemic, this visit was completed with telemedicine (audio/video) technology to reduce patient and provider exposure as well as to preserve personal protective equipment.   Katelyn Walsh has verbally consented to this TeleHealth visit. The patient is located at home, the provider is located at the [facility] office. The participants in this visit include the listed provider and patient. The visit was conducted today via telephone.  Katelyn Walsh was unable to use realtime audiovisual technology today and the telehealth visit was conducted via telephone.  Chief Complaint: OBESITY Katelyn Walsh is here to discuss her progress with her obesity treatment plan along with follow-up of her obesity related diagnoses. Katelyn Walsh is on the Category 2 Plan + 100 calories and states she is following her eating plan approximately 40-50% of the time. Katelyn Walsh states she walked for 20 minutes 1 time.  Today's visit was #: 27 Starting weight: 224 lbs Starting date: 05/11/2019  Interim History: Katelyn Walsh is unable to log onto MyChart for video visit. This is her first OV with me. Her last OV was 5 weeks ago with Katelyn Walsh, when she gained 12 lbs. In the upcoming weeks, she is traveling to [facility] and [place]. She has been eating off plan most of the time lately and fears with traveling, she won't be able to in the near future.  Assessment/Plan:   1. Other disorder of eating Controlled. Medication: Wellbutrin.   Plan: Behavior modification techniques were discussed today to help deal with emotional/non-hunger eating behaviors.  - buPROPion (WELLBUTRIN SR) 150 MG 12 hr tablet; Take 1 tablet (150 mg total) by mouth daily.  Dispense: 30 tablet; Refill: 0  2. Pre-diabetes Not at goal. Goal is HgbA1c < 5.7.  Medication: No meds.    Plan:  She will continue to focus on protein-rich, low simple carbohydrate foods. We reviewed the importance of hydration, regular exercise for stress  reduction, and restorative sleep. Consider rechecking A1c possibly at next OV, along with BMP with GFR. Consider adding Metformin or other to regimen sometime in the near future.  Lab Results  Component Value Date   HGBA1C 6.2 (H) 01/30/2020   Lab Results  Component Value Date   INSULIN 9.7 01/30/2020   INSULIN 10.6 10/02/2019   INSULIN 20.3 05/11/2019    3. Class 2 severe obesity with serious comorbidity and body mass index (BMI) of 38.0 to 38.9 in adult, unspecified obesity type (HCC)  Katelyn Walsh is currently in the action stage of change. As such, her goal is to continue with weight loss efforts. She has agreed to the Category 2 Plan + 100 calories.   Exercise goals:  Goal: 10-15 minutes of walking QD.  Behavioral modification strategies: travel eating strategies and planning for success. Strategies discussed with pt for getting back to healthier habits.  Katelyn Walsh has agreed to follow-up with our clinic in 3 weeks. She was informed of the importance of frequent follow-up visits to maximize her success with intensive lifestyle modifications for her multiple health conditions.  Objective:   VITALS: Per patient if applicable, see vitals. GENERAL: Alert and in no acute distress. CARDIOPULMONARY: No increased WOB. Speaking in clear sentences.  PSYCH: Pleasant and cooperative. Speech normal rate and rhythm. Affect is appropriate. Insight and judgement are appropriate. Attention is focused, linear, and appropriate.  NEURO: Oriented as arrived to appointment on time with no prompting.   Lab Results  Component Value Date   CREATININE 1.01 (H) 01/30/2020   BUN 17 01/30/2020  NA 143 01/30/2020   K 4.3 01/30/2020   CL 104 01/30/2020   CO2 23 01/30/2020   Lab Results  Component Value Date   ALT 18 01/30/2020   AST 18 01/30/2020   ALKPHOS 91 01/30/2020   BILITOT 0.7 01/30/2020   Lab Results  Component Value Date   HGBA1C 6.2 (H) 01/30/2020   HGBA1C 6.0 (H) 10/02/2019   Lab Results   Component Value Date   INSULIN 9.7 01/30/2020   INSULIN 10.6 10/02/2019   INSULIN 20.3 05/11/2019   Lab Results  Component Value Date   TSH 0.997 10/02/2019   Lab Results  Component Value Date   CHOL 114 10/02/2019   HDL 40 10/02/2019   LDLCALC 56 10/02/2019   TRIG 91 10/02/2019   Lab Results  Component Value Date   WBC 6.0 04/26/2019   HGB 12.6 04/26/2019   HCT 40.5 04/26/2019   MCV 96.2 04/26/2019   PLT 171 04/26/2019   No results found for: IRON, TIBC, FERRITIN  Attestation Statements:   Reviewed by clinician on day of visit: allergies, medications, problem list, medical history, surgical history, family history, social history, and previous encounter notes.  )Time spent on visit including pre-visit chart review and post-visit charting and care was 30 minutes.   [name], CMA, am acting as transcriptionist for [employer], DO.  I have reviewed the above documentation for accuracy and completeness, and I agree with the above. Marjory Sneddon, D.O.  The [place] was signed into law in 2016 which includes the topic of electronic health records.  This provides immediate access to information in MyChart.  This includes consultation notes, operative notes, office notes, lab results and pathology reports.  If you have any questions about what you read please let us know at your next visit so we can discuss your concerns and take corrective action if need be.  We are right here with you.

## 2020-08-29 ENCOUNTER — Encounter (INDEPENDENT_AMBULATORY_CARE_PROVIDER_SITE_OTHER): Payer: Self-pay | Admitting: Family Medicine

## 2020-08-29 ENCOUNTER — Ambulatory Visit (INDEPENDENT_AMBULATORY_CARE_PROVIDER_SITE_OTHER): Payer: Medicare Other | Admitting: Family Medicine

## 2020-08-29 ENCOUNTER — Other Ambulatory Visit: Payer: Self-pay

## 2020-08-29 VITALS — BP 124/66 | HR 57 | Temp 97.9°F | Ht 64.0 in | Wt 196.0 lb

## 2020-08-29 DIAGNOSIS — Z6838 Body mass index (BMI) 38.0-38.9, adult: Secondary | ICD-10-CM

## 2020-08-29 DIAGNOSIS — F5089 Other specified eating disorder: Secondary | ICD-10-CM

## 2020-08-29 DIAGNOSIS — Z9189 Other specified personal risk factors, not elsewhere classified: Secondary | ICD-10-CM

## 2020-08-29 MED ORDER — BUPROPION HCL ER (SR) 150 MG PO TB12: 150.0000 mg | ORAL_TABLET | Freq: Every day | ORAL | 0 refills | Status: DC

## 2020-09-11 NOTE — Progress Notes (Signed)
Chief Complaint:   OBESITY Katelyn Walsh is here to discuss her progress with her obesity treatment plan along with follow-up of her obesity related diagnoses.   Today's visit was #: 28 Starting weight: 224 lbs Starting date: 05/11/2019 Today's weight: 196 lbs Today's date: 08/29/2020 Weight change since last in office visit: 8 lbs Total lbs lost to date: 28 lbs Body mass index is 33.64 kg/m.  Total weight loss percentage to date: -12.50%  Interim History:  Katelyn Walsh's last in office visit was on 07/01/2020.  Katelyn Walsh has lost 8 pounds since then.  No change in diet, but Katelyn Walsh has been doing a lot more activity lately.  No issues or concerns with the plan.  Current Meal Plan: the Category 2 Plan +100 calories for 50% of the time.  Current Exercise Plan: Walking for 30 minutes 4 times per week.  Assessment/Plan:   Medications Discontinued During This Encounter  Medication Reason   buPROPion (WELLBUTRIN SR) 150 MG 12 hr tablet Reorder   Meds ordered this encounter  Medications   buPROPion (WELLBUTRIN SR) 150 MG 12 hr tablet    Sig: Take 1 tablet (150 mg total) by mouth daily. (After lunch)    Dispense:  30 tablet    Refill:  0    Ov for rf   1. Other disorder of eating Katelyn Walsh takes Wellbutrin every morning.  Katelyn Walsh says Katelyn Walsh is doing more snacking after dinner while watching TV or on the computer.  Endorses a lot of mindless eating.  Plan:  Refill Wellbutrin but will trial 1 tablet at lunch.  Katelyn Walsh was given a Mindful Eating handout today and discussion was had with her today to change behavior.  - Refill buPROPion (WELLBUTRIN SR) 150 MG 12 hr tablet; Take 1 tablet (150 mg total) by mouth daily. (After lunch)  Dispense: 30 tablet; Refill: 0  2. Class 2 severe obesity with serious comorbidity and body mass index (BMI) of 38.0 to 38.9 in adult, unspecified obesity type ([place])  Course: Katelyn Walsh is currently in the action stage of change. As such, her goal is to continue with weight loss efforts.    Nutrition goals: Katelyn Walsh has agreed to the Category 2 Plan +100 calories.   Exercise goals:  Walk for 30 minutes 5 days per week.  Behavioral modification strategies: ways to avoid boredom eating, ways to avoid night time snacking, better snacking choices, avoiding temptations, and planning for success.  Katelyn Walsh has agreed to follow-up with our clinic in 2-4 weeks. Katelyn Walsh was informed of the importance of frequent follow-up visits to maximize her success with intensive lifestyle modifications for her multiple health conditions.   Objective:   Blood pressure 124/66, pulse (!) 57, temperature 97.9 F (36.6 C), height 5\' 4"  (1.626 m), weight 196 lb (88.9 kg), SpO2 99 %. Body mass index is 33.64 kg/m.  General: Cooperative, alert, well developed, in no acute distress. HEENT: Conjunctivae and lids unremarkable. Cardiovascular: Regular rhythm.  Lungs: Normal work of breathing. Neurologic: No focal deficits.   Lab Results  Component Value Date   CREATININE 1.01 (H) 01/30/2020   BUN 17 01/30/2020   NA 143 01/30/2020   K 4.3 01/30/2020   CL 104 01/30/2020   CO2 23 01/30/2020   Lab Results  Component Value Date   ALT 18 01/30/2020   AST 18 01/30/2020   ALKPHOS 91 01/30/2020   BILITOT 0.7 01/30/2020   Lab Results  Component Value Date   HGBA1C 6.2 (H) 01/30/2020   HGBA1C 6.0 (  H) 10/02/2019   Lab Results  Component Value Date   INSULIN 9.7 01/30/2020   INSULIN 10.6 10/02/2019   INSULIN 20.3 05/11/2019   Lab Results  Component Value Date   TSH 0.997 10/02/2019   Lab Results  Component Value Date   CHOL 114 10/02/2019   HDL 40 10/02/2019   LDLCALC 56 10/02/2019   TRIG 91 10/02/2019   Lab Results  Component Value Date   VD25OH 51.3 10/02/2019   VD25OH 27 (L) 01/09/2014   VD25OH 34 01/05/2012   Lab Results  Component Value Date   WBC 6.0 04/26/2019   HGB 12.6 04/26/2019   HCT 40.5 04/26/2019   MCV 96.2 04/26/2019   PLT 171 04/26/2019   Obesity Behavioral  Intervention:   Approximately 15 minutes were spent on the discussion below.  ASK: We discussed the diagnosis of obesity with Katelyn Walsh today and Katelyn Walsh agreed to give [place] permission to discuss obesity behavioral modification therapy today.  ASSESS: Katelyn Walsh has the diagnosis of obesity and her BMI today is 33.7. Katelyn Walsh is in the action stage of change.   ADVISE: Katelyn Walsh was educated on the multiple health risks of obesity as well as the benefit of weight loss to improve her health. Katelyn Walsh was advised of the need for long term treatment and the importance of lifestyle modifications to improve her current health and to decrease her risk of future health problems.  AGREE: Multiple dietary modification options and treatment options were discussed and Katelyn Walsh agreed to follow the recommendations documented in the above note.  ARRANGE: Katelyn Walsh was educated on the importance of frequent visits to treat obesity as outlined per CMS and USPSTF guidelines and agreed to schedule her next follow up appointment today.  Attestation Statements:   Reviewed by clinician on day of visit: allergies, medications, problem list, medical history, surgical history, family history, social history, and previous encounter notes.  I, Water quality scientist, CMA, am acting as Location manager for [employer], DO.  I have reviewed the above documentation for accuracy and completeness, and I agree with the above. Marjory Sneddon, D.O.  The [place] was signed into law in 2016 which includes the topic of electronic health records.  This provides immediate access to information in MyChart.  This includes consultation notes, operative notes, office notes, lab results and pathology reports.  If you have any questions about what you read please let us know at your next visit so we can discuss your concerns and take corrective action if need be.  We are right here with you.

## 2020-09-17 ENCOUNTER — Encounter (INDEPENDENT_AMBULATORY_CARE_PROVIDER_SITE_OTHER): Payer: Self-pay

## 2020-09-19 ENCOUNTER — Ambulatory Visit (INDEPENDENT_AMBULATORY_CARE_PROVIDER_SITE_OTHER): Payer: Medicare Other | Admitting: Adult Health

## 2020-10-07 ENCOUNTER — Encounter (INDEPENDENT_AMBULATORY_CARE_PROVIDER_SITE_OTHER): Payer: Self-pay | Admitting: Adult Health

## 2020-10-07 ENCOUNTER — Telehealth (INDEPENDENT_AMBULATORY_CARE_PROVIDER_SITE_OTHER): Payer: Medicare Other | Admitting: Adult Health

## 2020-10-07 ENCOUNTER — Other Ambulatory Visit: Payer: Self-pay

## 2020-10-07 DIAGNOSIS — E669 Obesity, unspecified: Secondary | ICD-10-CM

## 2020-10-07 DIAGNOSIS — R197 Diarrhea, unspecified: Secondary | ICD-10-CM

## 2020-10-07 DIAGNOSIS — Z6833 Body mass index (BMI) 33.0-33.9, adult: Secondary | ICD-10-CM

## 2020-10-07 DIAGNOSIS — F5089 Other specified eating disorder: Secondary | ICD-10-CM

## 2020-10-08 ENCOUNTER — Inpatient Hospital Stay: Payer: Medicare Other | Admitting: Hematology and Oncology

## 2020-10-08 ENCOUNTER — Ambulatory Visit: Payer: Medicare Other | Admitting: Hematology and Oncology

## 2020-10-08 DIAGNOSIS — R197 Diarrhea, unspecified: Secondary | ICD-10-CM | POA: Insufficient documentation

## 2020-10-08 MED ORDER — BUPROPION HCL ER (SR) 150 MG PO TB12: 150.0000 mg | ORAL_TABLET | Freq: Every day | ORAL | 0 refills | Status: DC

## 2020-10-08 NOTE — Assessment & Plan Note (Deleted)
Left breast invasive ductal carcinoma, 1.8 cm, T1 C. N0 M0 stage IA ER percent, PR negative, HER-2 negative, grade 2, Ki-67 90%, 3 SLN negative, Oncotype DX recurrence score 18, percent risk of recurrence, status post radiation therapy, Arimidex 1 mg daily was started August 2013 later switched to exemestane then switched to Femara and then switch to tamoxifen and again switched back to Anastrozole Nov 2015.  Completed August 2020  Anastrozole toxicities: 1. Itching of the skin:Did not improve by stopping anastrozole encouraged her to use moisturizers.. Bone density December 2015 revealed a T score of -0.9, normal bone density 3. Weight gain issues: She is working with healthy weight loss clinic and has lost 30 pounds  Patient loves going to [facility].  Breast Cancer Surveillance: 1. Breast exam 10/08/20: Benign 2. Mammogram11/25/21 no evidence of malignancy breast density category B 12/15/2016: MRI pelvis: Perianal fistulous CT angiogram 04/25/2019: No evidence of PE Lower extremity edema due to diastolic dysfunction on diuretics.   Return to clinic in1 yearfor follow-upfor surveillance.

## 2020-10-08 NOTE — Progress Notes (Signed)
TeleHealth Visit:  Due to the COVID-19 pandemic, this visit was completed with telemedicine (audio/video) technology to reduce patient and provider exposure as well as to preserve personal protective equipment.   Mckenzye has verbally consented to this TeleHealth visit. The patient is located at home, the provider is located at the [facility] office. The participants in this visit include the listed provider and patient. The visit was conducted today via video.   Chief Complaint: OBESITY Jaynah is here to discuss her progress with her obesity treatment plan along with follow-up of her obesity related diagnoses. Talyia is on the Category 2 Plan + 100 calories and states she is following her eating plan approximately 20-30% of the time. Amyra states she is not currently exercising.  Today's visit was #: 29 Starting weight: 224 lbs Starting date: 05/11/2019  Interim History: Bellamia is currently experiencing diarrhea and low grade temperature of 100 F oral. She recently traveled to [place], [place], and [facility] for 10 days. She returned home yesterday and symptoms started last night. She has had 2 doses of Pfizer COVID-19 vaccine- last dose 05/23/2020.   Subjective:   1. Diarrhea, unspecified type Madline is currently experiencing diarrhea and low grade temperature of 100 F oral. She is able to tolerate foods and fluids.  2. Other disorder of eating Lynnley reports stable mood. Pt denies suicidal or homicidal ideations.  She is on bupropion SR 150 mg QD. Her cravings are well controlled.  Assessment/Plan:   1. Diarrhea, unspecified type COVID test now, then repeat in 2 days if symptoms persist.  2. Other disorder of eating Behavior modification techniques were discussed today to help Amea deal with her emotional/non-hunger eating behaviors.  Orders and follow up as documented in patient record.   Refill bupropion SR 150 mg QD., disp 30 tabs, no RF.  3. Class 1 obesity with  serious comorbidity and body mass index (BMI) of 33.0 to 33.9 in adult, unspecified obesity type  Siana is currently in the action stage of change. As such, her goal is to continue with weight loss efforts. She has agreed to the Category 2 Plan + 100 calories.   COVID test now and repeat in 2 days if symptoms persist. Get Pfizer COVID-19 booster once fully recovered for acute symptoms.  If COVID-19 test +, follow up with PCP and current CDC COVID-19 guidelines.     Exercise goals:  Rest while not feeling well.  Behavioral modification strategies: increasing lean protein intake, decreasing simple carbohydrates, keeping healthy foods in the home, ways to avoid boredom eating, and planning for success.  Rachell has agreed to follow-up with our clinic in 2 weeks. She was informed of the importance of frequent follow-up visits to maximize her success with intensive lifestyle modifications for her multiple health conditions.  Objective:   VITALS: Per patient if applicable, see vitals. GENERAL: Alert and in no acute distress. CARDIOPULMONARY: No increased WOB. Speaking in clear sentences.  PSYCH: Pleasant and cooperative. Speech normal rate and rhythm. Affect is appropriate. Insight and judgement are appropriate. Attention is focused, linear, and appropriate.  NEURO: Oriented as arrived to appointment on time with no prompting.   Lab Results  Component Value Date   CREATININE 1.01 (H) 01/30/2020   BUN 17 01/30/2020   NA 143 01/30/2020   K 4.3 01/30/2020   CL 104 01/30/2020   CO2 23 01/30/2020   Lab Results  Component Value Date   ALT 18 01/30/2020   AST 18  01/30/2020   ALKPHOS 91 01/30/2020   BILITOT 0.7 01/30/2020   Lab Results  Component Value Date   HGBA1C 6.2 (H) 01/30/2020   HGBA1C 6.0 (H) 10/02/2019   Lab Results  Component Value Date   INSULIN 9.7 01/30/2020   INSULIN 10.6 10/02/2019   INSULIN 20.3 05/11/2019   Lab Results  Component Value Date   TSH 0.997 10/02/2019    Lab Results  Component Value Date   CHOL 114 10/02/2019   HDL 40 10/02/2019   LDLCALC 56 10/02/2019   TRIG 91 10/02/2019   Lab Results  Component Value Date   VD25OH 51.3 10/02/2019   VD25OH 27 (L) 01/09/2014   VD25OH 34 01/05/2012   Lab Results  Component Value Date   WBC 6.0 04/26/2019   HGB 12.6 04/26/2019   HCT 40.5 04/26/2019   MCV 96.2 04/26/2019   PLT 171 04/26/2019   No results found for: IRON, TIBC, FERRITIN  Attestation Statements:   Reviewed by clinician on day of visit: allergies, medications, problem list, medical history, surgical history, family history, social history, and previous encounter notes.  Total time of visit 28 minutes.  [name], CMA, am acting as transcriptionist for Mina Marble, NP.  I have reviewed the above documentation for accuracy and completeness, and I agree with the above. - Chandrika Sandles d. Rechel Delosreyes, NP-C

## 2020-10-20 DIAGNOSIS — Z23 Encounter for immunization: Secondary | ICD-10-CM | POA: Diagnosis not present

## 2020-10-23 ENCOUNTER — Ambulatory Visit (INDEPENDENT_AMBULATORY_CARE_PROVIDER_SITE_OTHER): Payer: Federal, State, Local not specified - PPO | Admitting: Adult Health

## 2020-10-29 NOTE — Progress Notes (Signed)
Patient Care Team: Velna Hatchet, MD as PCP - General (Internal Medicine) Jerline Pain, MD as PCP - Cardiology (Cardiology) Lavone Orn, MD as Attending Physician (Internal Medicine) Harlin Heys, MD as Referring Physician (Family Medicine)  DIAGNOSIS:    ICD-10-CM   1. Primary cancer of upper inner quadrant of left female breast ([place])  C50.212       SUMMARY OF ONCOLOGIC HISTORY: Oncology History  Primary cancer of upper inner quadrant of left female breast ([place])  07/31/2011 Surgery   left breast lumpectomy: IDC grade to 1.8 cm, no LV are, with DCIS, 3 sentinel nodes negative, grade 2, ER 100%, PR 0%, HER-2 negative, Ki-67 9%, T1 cN0 M0 stage IA, Oncotype score 18, 11% ROR low risk   10/14/2011 - 11/17/2011 Radiation Therapy   Adjuvant Radiation therapy   10/14/2011 - 10/03/2018 Anti-estrogen oral therapy   Arimidex 1 mg daily developed a change switched to Femara and exemestane and then took tamoxifen and now back to Arimidex from 01/09/2014       CHIEF COMPLIANT: Follow-up of left breast cancer  INTERVAL HISTORY: Katelyn Walsh is a 70 y.o. with above-mentioned history of  left breast cancer treated with lumpectomy, radiation, and who completed 5 years of anti-estrogen therapy with anastrozole. Mammogram on 01/15/20 showed no evidence of malignancy bilaterally. She presents to the clinic today for annual follow-up.   ALLERGIES:  is allergic to dust mite extract and pollen extract.  MEDICATIONS:  Current Outpatient Medications  Medication Sig Dispense Refill   ALPRAZolam (XANAX) 0.5 MG tablet Take 0.5 mg by mouth at bedtime as needed for anxiety.     Ascorbic Acid (VITAMIN C) 500 MG CAPS Take 1 capsule by mouth daily.     aspirin EC 81 MG tablet Take 81 mg by mouth daily.     buPROPion (WELLBUTRIN SR) 150 MG 12 hr tablet Take 1 tablet (150 mg total) by mouth daily. (After lunch) 30 tablet 0   cholecalciferol 5000 units TABS Take 1 tablet (5,000 Units total) by mouth  daily.     citalopram (CELEXA) 20 MG tablet Take 20 mg by mouth daily.     furosemide (LASIX) 40 MG tablet Take 0.5 tablets (20 mg total) by mouth daily as needed for fluid or edema (take 1 tablet a day for 2 to 3 days if you notice leg swelling or weight gain). 30 tablet 0   hydrOXYzine (ATARAX/VISTARIL) 25 MG tablet Take 1 tablet (25 mg total) by mouth 3 (three) times daily as needed. 30 tablet 6   levocetirizine (XYZAL) 5 MG tablet Take 1 tablet (5 mg total) by mouth every evening.     losartan (COZAAR) 100 MG tablet Take 100 mg by mouth every morning.      montelukast (SINGULAIR) 10 MG tablet Take 10 mg by mouth at bedtime.     Probiotic Product (ALIGN) 4 MG CAPS Take 1 capsule by mouth daily with breakfast.      rOPINIRole (REQUIP) 1 MG tablet Take 1-2 mg by mouth at bedtime.      rosuvastatin (CRESTOR) 20 MG tablet Take 20 mg by mouth at bedtime.     topiramate (TOPAMAX) 100 MG tablet Take 100 mg by mouth 2 (two) times daily.     zinc gluconate 50 MG tablet Take 50 mg by mouth daily.     No current facility-administered medications for this visit.    PHYSICAL EXAMINATION: ECOG PERFORMANCE STATUS: 1 - Symptomatic but completely ambulatory  Vitals:  10/30/20 1042  BP: 126/82  Pulse: 69  Resp: 18  Temp: (!) 97.3 F (36.3 C)  SpO2: 100%   Filed Weights   10/30/20 1042  Weight: 207 lb 6.4 oz (94.1 kg)    BREAST: No palpable masses or nodules in either right or left breasts. No palpable axillary supraclavicular or infraclavicular adenopathy no breast tenderness or nipple discharge. (exam performed in the presence of a chaperone)  LABORATORY DATA:  I have reviewed the data as listed CMP Latest Ref Rng & Units 01/30/2020 10/02/2019 05/11/2019  Glucose 65 - 99 mg/dL 118(H) 116(H) 140(H)  BUN 8 - 27 mg/dL _0 Creatinine 0.57 - 1.00 mg/dL 1.01(H) 0.87 0.97  Sodium 134 - 144 mmol/L 143 140 144  Potassium 3.5 - 5.2 mmol/L 4.3 4.4 4.4  Chloride 96 - 106 mmol/L 104 105 109(H)   CO2 20 - 29 mmol/L 23 21 19(L)  Calcium 8.7 - 10.3 mg/dL 9.7 9.0 9.3  Total Protein 6.0 - 8.5 g/dL 7.6 7.7 7.2  Total Bilirubin 0.0 - 1.2 mg/dL 0.7 0.5 0.6  Alkaline Phos 44 - 121 IU/L 91 92 87  AST 0 - 40 IU/L _1 ALT 0 - 32 IU/L _2 Lab Results  Component Value Date   WBC 6.0 04/26/2019   HGB 12.6 04/26/2019   HCT 40.5 04/26/2019   MCV 96.2 04/26/2019   PLT 171 04/26/2019   NEUTROABS 3.1 07/31/2014    ASSESSMENT & PLAN:  Primary cancer of upper inner quadrant of left female breast ([place]) Left breast invasive ductal carcinoma, 1.8 cm, T1 C. N0 M0 stage IA ER percent, PR negative, HER-2 negative, grade 2, Ki-67 90%, 3 SLN negative, Oncotype DX recurrence score 18, percent risk of recurrence, status post radiation therapy, Arimidex 1 mg daily was started August 2013 later switched to exemestane then switched to Femara and then switch to tamoxifen and again switched back to Anastrozole Nov 2015.  Completed August 2020   Anastrozole toxicities: 1. Itching of the skin: Improved.  3. Weight gain issues:  She is working with healthy weight loss clinic and has lost 30 pounds.  Because of her multiple travels to [place], she has gained some weight.   Patient loves going to [facility].    Breast Cancer Surveillance: 1. Breast exam 10/30/2020: Normal 2. Mammogram 01/18/2020 no evidence of malignancy breast density category B 12/15/2016: MRI pelvis: Perianal fistulous  Lower extremity edema due to diastolic dysfunction on diuretics.  Bone density December 2015 revealed a T score of -0.9, normal bone density   Return to clinic in 1 year for follow-up for surveillance.    No orders of the defined types were placed in this encounter.  The patient has a good understanding of the overall plan. she agrees with it. she will call with any problems that may develop before the next visit here.  Total time spent: 20 mins including face to face time and time spent for  planning, charting and coordination of care  Rulon Eisenmenger, MD, MPH 10/30/2020  I, Thana Ates, am acting as scribe for Dr. Nicholas Lose.  I have reviewed the above documentation for accuracy and completeness, and I agree with the above.

## 2020-10-30 ENCOUNTER — Inpatient Hospital Stay: Payer: Medicare Other | Attending: Hematology and Oncology | Admitting: Hematology and Oncology

## 2020-10-30 ENCOUNTER — Other Ambulatory Visit: Payer: Self-pay

## 2020-10-30 DIAGNOSIS — C50212 Malignant neoplasm of upper-inner quadrant of left female breast: Secondary | ICD-10-CM

## 2020-10-30 DIAGNOSIS — Z923 Personal history of irradiation: Secondary | ICD-10-CM | POA: Insufficient documentation

## 2020-10-30 DIAGNOSIS — Z853 Personal history of malignant neoplasm of breast: Secondary | ICD-10-CM | POA: Insufficient documentation

## 2020-10-30 NOTE — Assessment & Plan Note (Signed)
Left breast invasive ductal carcinoma, 1.8 cm, T1 C. N0 M0 stage IA ER percent, PR negative, HER-2 negative, grade 2, Ki-67 90%, 3 SLN negative, Oncotype DX recurrence score 18, percent risk of recurrence, status post radiation therapy, Arimidex 1 mg daily was started August 2013 later switched to exemestane then switched to Femara and then switch to tamoxifen and again switched back to Anastrozole Nov 2015.  Completed August 2020  Anastrozole toxicities: 1. Itching of the skin:Did not improve by stopping anastrozole encouraged her to use moisturizers.. Bone density December 2015 revealed a T score of -0.9, normal bone density 3. Weight gain issues: She is working with healthy weight loss clinic and has lost 30 pounds  Patient loves going to [facility].  Breast Cancer Surveillance: 1. Breast exam9/08/2020: Normal 2. Mammogram11/25/2021 no evidence of malignancy breast density category B 12/15/2016: MRI pelvis: Perianal fistulous  Lower extremity edema due to diastolic dysfunction on diuretics.   Return to clinic in1 yearfor follow-upfor surveillance.

## 2020-11-04 ENCOUNTER — Ambulatory Visit (INDEPENDENT_AMBULATORY_CARE_PROVIDER_SITE_OTHER): Payer: Medicare Other | Admitting: Adult Health

## 2020-11-14 ENCOUNTER — Encounter (INDEPENDENT_AMBULATORY_CARE_PROVIDER_SITE_OTHER): Payer: Self-pay | Admitting: Family Medicine

## 2020-11-14 ENCOUNTER — Other Ambulatory Visit: Payer: Self-pay

## 2020-11-14 ENCOUNTER — Ambulatory Visit (INDEPENDENT_AMBULATORY_CARE_PROVIDER_SITE_OTHER): Payer: Medicare Other | Admitting: Family Medicine

## 2020-11-14 VITALS — BP 121/76 | HR 72 | Temp 98.2°F | Ht 64.0 in | Wt 203.0 lb

## 2020-11-14 DIAGNOSIS — F5089 Other specified eating disorder: Secondary | ICD-10-CM | POA: Diagnosis not present

## 2020-11-14 DIAGNOSIS — Z6838 Body mass index (BMI) 38.0-38.9, adult: Secondary | ICD-10-CM

## 2020-11-14 MED ORDER — BUPROPION HCL ER (SR) 150 MG PO TB12: 150.0000 mg | ORAL_TABLET | Freq: Every day | ORAL | 0 refills | Status: DC

## 2020-11-14 NOTE — Progress Notes (Signed)
Chief Complaint:   OBESITY Katelyn Walsh is here to discuss her progress with her obesity treatment plan along with follow-up of her obesity related diagnoses. Katelyn Walsh is on the Category 2 Plan + 100 calories and states she is following her eating plan approximately 40% of the time. Katelyn Walsh states she is doing 0 minutes 0 times per week.  Today's visit was #: 42 Starting weight: 224 lbs Starting date: 05/11/2019 Today's weight: 203 lbs Today's date: 11/14/2020 Total lbs lost to date: 21 Total lbs lost since last in-office visit: 0  Interim History: Katelyn Walsh traveled and she was sick for 1 week with GI illness. She was unable to stick to the plan during the past several weeks.  She states "she feels lost and like a failure".    Subjective:   1. Other disorder of eating Katelyn Walsh is not cooking or food prepping. She is taking Wellbutrin SR 150 mg. Katelyn Walsh is tolerating medication(s) well without side effects.  Medication compliance is good and patient appears to be taking it as prescribed.  Denies additional concerns regarding this condition.      Assessment/Plan:     Medications Discontinued During This Encounter  Medication Reason   furosemide (LASIX) 40 MG tablet Error   buPROPion (WELLBUTRIN SR) 150 MG 12 hr tablet Reorder     Meds ordered this encounter  Medications   buPROPion (WELLBUTRIN SR) 150 MG 12 hr tablet    Sig: Take 1 tablet (150 mg total) by mouth daily. (After lunch)    Dispense:  30 tablet    Refill:  0    Ov for rf     1. Other disorder of eating Behavior modification techniques were discussed today to help Katelyn Walsh deal with her emotional/non-hunger eating behaviors. We will refill Wellbutrin SR for 1 month. Orders and follow up as documented in patient record.   - buPROPion (WELLBUTRIN SR) 150 MG 12 hr tablet; Take 1 tablet (150 mg total) by mouth daily. (After lunch)  Dispense: 30 tablet; Refill: 0  2. Obesity with current BMI of 35.0 Katelyn Walsh is currently in  the action stage of change. As such, her goal is to continue with weight loss efforts. She has agreed to the Category 2 Plan + 100 calories.   Katelyn Walsh was given the meal plan again.    She needs to meal prep and cook.   - Ideas and strategies were discussed with the patient. Recipes were given and discussed.  Exercise goals: All adults should avoid inactivity. Some physical activity is better than none, and adults who participate in any amount of physical activity gain some health benefits.  Behavioral modification strategies: increasing lean protein intake, decreasing simple carbohydrates, and planning for success.  Katelyn Walsh has agreed to follow-up with our clinic in 2 weeks. She was informed of the importance of frequent follow-up visits to maximize her success with intensive lifestyle modifications for her multiple health conditions.     Objective:   Blood pressure 121/76, pulse 72, temperature 98.2 F (36.8 C), height 5\' 4"  (1.626 m), weight 203 lb (92.1 kg), SpO2 97 %. Body mass index is 34.84 kg/m.  General: Cooperative, alert, well developed, in no acute distress. HEENT: Conjunctivae and lids unremarkable. Cardiovascular: Regular rhythm.  Lungs: Normal work of breathing. Neurologic: No focal deficits.   Lab Results  Component Value Date   CREATININE 1.01 (H) 01/30/2020   BUN 17 01/30/2020   NA 143 01/30/2020   K 4.3 01/30/2020   CL  104 01/30/2020   CO2 23 01/30/2020   Lab Results  Component Value Date   ALT 18 01/30/2020   AST 18 01/30/2020   ALKPHOS 91 01/30/2020   BILITOT 0.7 01/30/2020   Lab Results  Component Value Date   HGBA1C 6.2 (H) 01/30/2020   HGBA1C 6.0 (H) 10/02/2019   Lab Results  Component Value Date   INSULIN 9.7 01/30/2020   INSULIN 10.6 10/02/2019   INSULIN 20.3 05/11/2019   Lab Results  Component Value Date   TSH 0.997 10/02/2019   Lab Results  Component Value Date   CHOL 114 10/02/2019   HDL 40 10/02/2019   LDLCALC 56 10/02/2019   TRIG  91 10/02/2019   Lab Results  Component Value Date   VD25OH 51.3 10/02/2019   VD25OH 27 (L) 01/09/2014   VD25OH 34 01/05/2012   Lab Results  Component Value Date   WBC 6.0 04/26/2019   HGB 12.6 04/26/2019   HCT 40.5 04/26/2019   MCV 96.2 04/26/2019   PLT 171 04/26/2019   No results found for: IRON, TIBC, FERRITIN  Obesity Behavioral Intervention:   Approximately 15 minutes were spent on the discussion below.  ASK: We discussed the diagnosis of obesity with Katelyn Walsh today and Katelyn Walsh agreed to give [place] permission to discuss obesity behavioral modification therapy today.  ASSESS: Dempsey has the diagnosis of obesity and her BMI today is 35.0. Amma is in the action stage of change.   ADVISE: Katelyn Walsh was educated on the multiple health risks of obesity as well as the benefit of weight loss to improve her health. She was advised of the need for long term treatment and the importance of lifestyle modifications to improve her current health and to decrease her risk of future health problems.  AGREE: Multiple dietary modification options and treatment options were discussed and Katelyn Walsh agreed to follow the recommendations documented in the above note.  ARRANGE: Katelyn Walsh was educated on the importance of frequent visits to treat obesity as outlined per CMS and USPSTF guidelines and agreed to schedule her next follow up appointment today.  Attestation Statements:   Reviewed by clinician on day of visit: allergies, medications, problem list, medical history, surgical history, family history, social history, and previous encounter notes.   Wilhemena Durie, am acting as transcriptionist for [employer], DO.  I have reviewed the above documentation for accuracy and completeness, and I agree with the above. Marjory Sneddon, D.O.  The [place] was signed into law in 2016 which includes the topic of electronic health records.  This provides immediate access to information in  MyChart.  This includes consultation notes, operative notes, office notes, lab results and pathology reports.  If you have any questions about what you read please let us know at your next visit so we can discuss your concerns and take corrective action if need be.  We are right here with you.

## 2020-11-27 DIAGNOSIS — E559 Vitamin D deficiency, unspecified: Secondary | ICD-10-CM | POA: Diagnosis not present

## 2020-11-27 DIAGNOSIS — Z0000 Encounter for general adult medical examination without abnormal findings: Secondary | ICD-10-CM | POA: Diagnosis not present

## 2020-11-27 DIAGNOSIS — E1169 Type 2 diabetes mellitus with other specified complication: Secondary | ICD-10-CM | POA: Diagnosis not present

## 2020-11-27 DIAGNOSIS — I10 Essential (primary) hypertension: Secondary | ICD-10-CM | POA: Diagnosis not present

## 2020-11-27 DIAGNOSIS — F330 Major depressive disorder, recurrent, mild: Secondary | ICD-10-CM | POA: Diagnosis not present

## 2020-11-27 DIAGNOSIS — Z23 Encounter for immunization: Secondary | ICD-10-CM | POA: Diagnosis not present

## 2020-11-27 DIAGNOSIS — F419 Anxiety disorder, unspecified: Secondary | ICD-10-CM | POA: Diagnosis not present

## 2020-11-27 DIAGNOSIS — G2581 Restless legs syndrome: Secondary | ICD-10-CM | POA: Diagnosis not present

## 2020-11-27 DIAGNOSIS — E785 Hyperlipidemia, unspecified: Secondary | ICD-10-CM | POA: Diagnosis not present

## 2020-11-27 DIAGNOSIS — L989 Disorder of the skin and subcutaneous tissue, unspecified: Secondary | ICD-10-CM | POA: Diagnosis not present

## 2020-11-27 LAB — HEMOGLOBIN A1C: Hemoglobin A1C: 5.6

## 2020-11-28 ENCOUNTER — Other Ambulatory Visit: Payer: Self-pay

## 2020-11-28 ENCOUNTER — Encounter (INDEPENDENT_AMBULATORY_CARE_PROVIDER_SITE_OTHER): Payer: Self-pay | Admitting: Family Medicine

## 2020-11-28 ENCOUNTER — Telehealth (INDEPENDENT_AMBULATORY_CARE_PROVIDER_SITE_OTHER): Payer: Medicare Other | Admitting: Family Medicine

## 2020-11-28 DIAGNOSIS — R7303 Prediabetes: Secondary | ICD-10-CM

## 2020-11-28 DIAGNOSIS — Z6838 Body mass index (BMI) 38.0-38.9, adult: Secondary | ICD-10-CM

## 2020-11-28 DIAGNOSIS — E66812 Obesity, class 2: Secondary | ICD-10-CM

## 2020-11-28 DIAGNOSIS — F5089 Other specified eating disorder: Secondary | ICD-10-CM

## 2020-11-28 NOTE — Progress Notes (Signed)
TeleHealth Visit:  Due to the COVID-19 pandemic, this visit was completed with telemedicine (audio/video) technology to reduce patient and provider exposure as well as to preserve personal protective equipment.   Katelyn Walsh has verbally consented to this TeleHealth visit. The patient is located at home, the provider is located at the [facility] office. The participants in this visit include the listed provider and patient. The visit was conducted today via video.  Chief Complaint: OBESITY Katelyn Walsh is here to discuss her progress with her obesity treatment plan along with follow-up of her obesity related diagnoses. Katelyn Walsh is on the Category 2 Plan plus 100 calories and states she is following her eating plan approximately 50-60% of the time. Katelyn Walsh states she is doing 0 minutes 0 times per week.  Today's visit was #: 31 Starting weight: 224 lbs Starting date: 05/11/2019  Interim History: Gaelle says she has been working on adhering to the plan better. She has been doing some traveling recently- to [facility] and [place] and she says it was hard to adhere to plan. She tends to want to snack at night.  Subjective:   1. Pre-diabetes Naila's A1C was elevated at 6.2 last December. She is not on Metformin. Her primary care provider repeated her A1C yesterday it was 5.5. She denies polyphagia.  Lab Results  Component Value Date   HGBA1C 6.2 (H) 01/30/2020   Lab Results  Component Value Date   INSULIN 9.7 01/30/2020   INSULIN 10.6 10/02/2019   INSULIN 20.3 05/11/2019    2. Other disorder of eating/emotional eating Katelyn Walsh is struggling with snacking at night despite not being hungry.  Assessment/Plan:   1. Pre-diabetes Katelyn Walsh will continue with the meal plan.  2. Other disorder of eating/emotional eating  We discussed strategies for avoiding emotional eating -snacking an non starchy vegetables, distraction, having coffee, gum. She declined appointment with Dr. Mallie Mussel but will  consider in the future. O  3. Obesity with current BMI of 35.83 Katelyn Walsh is currently in the action stage of change. As such, her goal is to continue with weight loss efforts. She has agreed to the Category 2 Plan.   Exercise goals:  We discussed [name] starting some form of exercise.  Behavioral modification strategies: emotional eating strategies.  Katelyn Walsh has agreed to follow-up with our clinic in 2 weeks with Dr. Raliegh Scarlet.  Objective:   VITALS: Per patient if applicable, see vitals. GENERAL: Alert and in no acute distress. CARDIOPULMONARY: No increased WOB. Speaking in clear sentences.  PSYCH: Pleasant and cooperative. Speech normal rate and rhythm. Affect is appropriate. Insight and judgement are appropriate. Attention is focused, linear, and appropriate.  NEURO: Oriented as arrived to appointment on time with no prompting.   Lab Results  Component Value Date   CREATININE 1.01 (H) 01/30/2020   BUN 17 01/30/2020   NA 143 01/30/2020   K 4.3 01/30/2020   CL 104 01/30/2020   CO2 23 01/30/2020   Lab Results  Component Value Date   ALT 18 01/30/2020   AST 18 01/30/2020   ALKPHOS 91 01/30/2020   BILITOT 0.7 01/30/2020   Lab Results  Component Value Date   HGBA1C 6.2 (H) 01/30/2020   HGBA1C 6.0 (H) 10/02/2019   Lab Results  Component Value Date   INSULIN 9.7 01/30/2020   INSULIN 10.6 10/02/2019   INSULIN 20.3 05/11/2019   Lab Results  Component Value Date   TSH 0.997 10/02/2019   Lab Results  Component Value Date   CHOL  114 10/02/2019   HDL 40 10/02/2019   LDLCALC 56 10/02/2019   TRIG 91 10/02/2019   Lab Results  Component Value Date   VD25OH 51.3 10/02/2019   VD25OH 27 (L) 01/09/2014   VD25OH 34 01/05/2012   Lab Results  Component Value Date   WBC 6.0 04/26/2019   HGB 12.6 04/26/2019   HCT 40.5 04/26/2019   MCV 96.2 04/26/2019   PLT 171 04/26/2019   No results found for: IRON, TIBC, FERRITIN  Attestation Statements:   Reviewed by clinician on day of  visit: allergies, medications, problem list, medical history, surgical history, family history, social history, and previous encounter notes.  I, Lizbeth Bark, RMA, am acting as Location manager for [employer], [place].   I have reviewed the above documentation for accuracy and completeness, and I agree with the above. - Georgianne Fick, FNP

## 2020-12-12 ENCOUNTER — Telehealth (INDEPENDENT_AMBULATORY_CARE_PROVIDER_SITE_OTHER): Payer: Medicare Other | Admitting: Family Medicine

## 2020-12-12 ENCOUNTER — Other Ambulatory Visit: Payer: Self-pay

## 2020-12-12 ENCOUNTER — Other Ambulatory Visit: Payer: Self-pay | Admitting: Internal Medicine

## 2020-12-12 ENCOUNTER — Encounter (INDEPENDENT_AMBULATORY_CARE_PROVIDER_SITE_OTHER): Payer: Self-pay | Admitting: Family Medicine

## 2020-12-12 DIAGNOSIS — R7303 Prediabetes: Secondary | ICD-10-CM | POA: Diagnosis not present

## 2020-12-12 DIAGNOSIS — H1010 Acute atopic conjunctivitis, unspecified eye: Secondary | ICD-10-CM

## 2020-12-12 DIAGNOSIS — Z6838 Body mass index (BMI) 38.0-38.9, adult: Secondary | ICD-10-CM

## 2020-12-12 DIAGNOSIS — R632 Polyphagia: Secondary | ICD-10-CM | POA: Diagnosis not present

## 2020-12-12 DIAGNOSIS — F5089 Other specified eating disorder: Secondary | ICD-10-CM

## 2020-12-12 DIAGNOSIS — Z1231 Encounter for screening mammogram for malignant neoplasm of breast: Secondary | ICD-10-CM

## 2020-12-12 MED ORDER — NAPHCON-A 0.025-0.3 % OP SOLN: 1.0000 [drp] | Freq: Two times a day (BID) | OPHTHALMIC | 0 refills | Status: DC | PRN

## 2020-12-12 MED ORDER — BUPROPION HCL ER (SR) 150 MG PO TB12: 150.0000 mg | ORAL_TABLET | Freq: Every day | ORAL | 0 refills | Status: DC

## 2020-12-16 NOTE — Progress Notes (Signed)
TeleHealth Visit:  Due to the COVID-19 pandemic, this visit was completed with telemedicine (audio/video) technology to reduce patient and provider exposure as well as to preserve personal protective equipment.   Katelyn Walsh has verbally consented to this TeleHealth visit. The patient is located at home, the provider is located at the [facility] office. The participants in this visit include the listed provider and patient. The visit was conducted today via video.   Chief Complaint: OBESITY Katelyn Walsh is here to discuss her progress with her obesity treatment plan along with follow-up of her obesity related diagnoses. Katelyn Walsh is on the Category 2 Plan + 100 calories and states she is following her eating plan approximately 40-50% of the time. Katelyn Walsh states she is walking 20 minutes 2 times per week.  Today's visit was #: 30 Starting weight: 224 lbs Starting date: 05/11/2019  Interim History: Video visit today due to pt having a "bad cold". She reports fever, chills, headache. She denies body aches and fatigue. She had a negative home COVID test. Her last OV was on 11/27/2020 with NP Dawn due to migraine.  Subjective:   1. Pre-diabetes Katelyn Walsh reports some increased hunger after dinner. No other cravings. She is not on medication.  2. Other disorder of eating Symptoms under good control lately. Katelyn Walsh is tolerating medication(s) well without side effects.  Medication compliance is good and patient appears to be taking it as prescribed.  Denies additional concerns regarding this condition. Medication: Wellbutrin   3. Allergic conjunctivitis, unspecified laterality Pt reports itchy, watery, irritated feelings as of late. Pt notes she needs to follow up with her allergist.  4. Polyphagia Pt reports some increased hunger after dinner. She is not weighing her proteins or vegetables.   Assessment/Plan:  No orders of the defined types were placed in this encounter.   Medications  Discontinued During This Encounter  Medication Reason   buPROPion (WELLBUTRIN SR) 150 MG 12 hr tablet Reorder     Meds ordered this encounter  Medications   buPROPion (WELLBUTRIN SR) 150 MG 12 hr tablet    Sig: Take 1 tablet (150 mg total) by mouth daily. (After lunch)    Dispense:  30 tablet    Refill:  0    Ov for rf   naphazoline-pheniramine (NAPHCON-A) 0.025-0.3 % ophthalmic solution    Sig: Place 1 drop into both eyes 2 (two) times daily as needed for eye irritation.    Dispense:  15 mL    Refill:  0     1. Pre-diabetes Pt advised we need to repeat labs in the near future. Pt will bring in recent A1c and other labs done by PCP to her next OV.  2. Other disorder of eating Behavior modification techniques were discussed today to help Katelyn Walsh deal with her emotional/non-hunger eating behaviors.  Orders and follow up as documented in patient record. Continue Wellbutrin at same dose.   Refill- buPROPion (WELLBUTRIN SR) 150 MG 12 hr tablet; Take 1 tablet (150 mg total) by mouth daily. (After lunch)  Dispense: 30 tablet; Refill: 0  3. Allergic conjunctivitis, unspecified laterality Start Naphcon-A OTC eye drops- 1 or 2 times daily, while she is waiting for allergy appt. Pt advised to wash her face after prolonged exposure to allergens.  Start- naphazoline-pheniramine (NAPHCON-A) 0.025-0.3 % ophthalmic solution; Place 1 drop into both eyes 2 (two) times daily as needed for eye irritation.  Dispense: 15 mL; Refill: 0  4. Polyphagia I recommend pt weigh meat/fish  after cooking and eat on plan until next OV. If she still has symptoms, we will consider starting medication.   5. Obesity with current BMI of 34.84  Katelyn Walsh is currently in the action stage of change. As such, her goal is to continue with weight loss efforts. She has agreed to the Category 2 Plan + 100 calories.   Bring in recent lab results from PCP's office for our review. Pt understands we may need additional  labs.  Exercise goals:  As is  Behavioral modification strategies: increasing lean protein intake, decreasing simple carbohydrates, and planning for success.  Katelyn Walsh has agreed to follow-up with our clinic in 3 weeks with NP [facility]. She was informed of the importance of frequent follow-up visits to maximize her success with intensive lifestyle modifications for her multiple health conditions.  Objective:   VITALS: Per patient if applicable, see vitals. GENERAL: Alert and in no acute distress. CARDIOPULMONARY: No increased WOB. Speaking in clear sentences.  PSYCH: Pleasant and cooperative. Speech normal rate and rhythm. Affect is appropriate. Insight and judgement are appropriate. Attention is focused, linear, and appropriate.  NEURO: Oriented as arrived to appointment on time with no prompting.   Lab Results  Component Value Date   CREATININE 1.01 (H) 01/30/2020   BUN 17 01/30/2020   NA 143 01/30/2020   K 4.3 01/30/2020   CL 104 01/30/2020   CO2 23 01/30/2020   Lab Results  Component Value Date   ALT 18 01/30/2020   AST 18 01/30/2020   ALKPHOS 91 01/30/2020   BILITOT 0.7 01/30/2020   Lab Results  Component Value Date   HGBA1C 6.2 (H) 01/30/2020   HGBA1C 6.0 (H) 10/02/2019   Lab Results  Component Value Date   INSULIN 9.7 01/30/2020   INSULIN 10.6 10/02/2019   INSULIN 20.3 05/11/2019   Lab Results  Component Value Date   TSH 0.997 10/02/2019   Lab Results  Component Value Date   CHOL 114 10/02/2019   HDL 40 10/02/2019   LDLCALC 56 10/02/2019   TRIG 91 10/02/2019   Lab Results  Component Value Date   VD25OH 51.3 10/02/2019   VD25OH 27 (L) 01/09/2014   VD25OH 34 01/05/2012   Lab Results  Component Value Date   WBC 6.0 04/26/2019   HGB 12.6 04/26/2019   HCT 40.5 04/26/2019   MCV 96.2 04/26/2019   PLT 171 04/26/2019   No results found for: IRON, TIBC, FERRITIN  Attestation Statements:   Reviewed by clinician on day of visit: allergies, medications,  problem list, medical history, surgical history, family history, social history, and previous encounter notes.  [name], CMA, am acting as transcriptionist for [employer], DO.  I have reviewed the above documentation for accuracy and completeness, and I agree with the above. Marjory Sneddon, D.O.  The [place] was signed into law in 2016 which includes the topic of electronic health records.  This provides immediate access to information in MyChart.  This includes consultation notes, operative notes, office notes, lab results and pathology reports.  If you have any questions about what you read please let us know at your next visit so we can discuss your concerns and take corrective action if need be.  We are right here with you.

## 2020-12-26 ENCOUNTER — Ambulatory Visit (INDEPENDENT_AMBULATORY_CARE_PROVIDER_SITE_OTHER): Payer: Medicare Other | Admitting: Family Medicine

## 2020-12-26 DIAGNOSIS — L218 Other seborrheic dermatitis: Secondary | ICD-10-CM | POA: Diagnosis not present

## 2020-12-26 DIAGNOSIS — D225 Melanocytic nevi of trunk: Secondary | ICD-10-CM | POA: Diagnosis not present

## 2020-12-26 DIAGNOSIS — L738 Other specified follicular disorders: Secondary | ICD-10-CM | POA: Diagnosis not present

## 2020-12-26 DIAGNOSIS — D2222 Melanocytic nevi of left ear and external auricular canal: Secondary | ICD-10-CM | POA: Diagnosis not present

## 2020-12-26 DIAGNOSIS — L0109 Other impetigo: Secondary | ICD-10-CM | POA: Diagnosis not present

## 2020-12-26 DIAGNOSIS — D2271 Melanocytic nevi of right lower limb, including hip: Secondary | ICD-10-CM | POA: Diagnosis not present

## 2020-12-26 DIAGNOSIS — L570 Actinic keratosis: Secondary | ICD-10-CM | POA: Diagnosis not present

## 2020-12-26 DIAGNOSIS — D1801 Hemangioma of skin and subcutaneous tissue: Secondary | ICD-10-CM | POA: Diagnosis not present

## 2020-12-26 DIAGNOSIS — D2262 Melanocytic nevi of left upper limb, including shoulder: Secondary | ICD-10-CM | POA: Diagnosis not present

## 2020-12-26 DIAGNOSIS — D2239 Melanocytic nevi of other parts of face: Secondary | ICD-10-CM | POA: Diagnosis not present

## 2021-01-09 ENCOUNTER — Ambulatory Visit (INDEPENDENT_AMBULATORY_CARE_PROVIDER_SITE_OTHER): Payer: Medicare Other | Admitting: Family Medicine

## 2021-01-15 ENCOUNTER — Ambulatory Visit: Payer: Self-pay

## 2021-01-30 ENCOUNTER — Encounter (INDEPENDENT_AMBULATORY_CARE_PROVIDER_SITE_OTHER): Payer: Self-pay | Admitting: Family Medicine

## 2021-01-30 ENCOUNTER — Ambulatory Visit (INDEPENDENT_AMBULATORY_CARE_PROVIDER_SITE_OTHER): Payer: Medicare Other | Admitting: Family Medicine

## 2021-01-30 ENCOUNTER — Other Ambulatory Visit: Payer: Self-pay

## 2021-01-30 ENCOUNTER — Encounter (INDEPENDENT_AMBULATORY_CARE_PROVIDER_SITE_OTHER): Payer: Self-pay

## 2021-01-30 VITALS — BP 115/77 | HR 69 | Temp 97.5°F | Ht 64.0 in | Wt 205.0 lb

## 2021-01-30 DIAGNOSIS — F5089 Other specified eating disorder: Secondary | ICD-10-CM | POA: Diagnosis not present

## 2021-01-30 DIAGNOSIS — Z6838 Body mass index (BMI) 38.0-38.9, adult: Secondary | ICD-10-CM

## 2021-01-30 DIAGNOSIS — I10 Essential (primary) hypertension: Secondary | ICD-10-CM

## 2021-01-30 DIAGNOSIS — R7303 Prediabetes: Secondary | ICD-10-CM | POA: Diagnosis not present

## 2021-01-30 DIAGNOSIS — E559 Vitamin D deficiency, unspecified: Secondary | ICD-10-CM | POA: Diagnosis not present

## 2021-01-30 MED ORDER — BUPROPION HCL ER (SR) 150 MG PO TB12: 150.0000 mg | ORAL_TABLET | Freq: Every day | ORAL | 0 refills | Status: DC

## 2021-02-03 ENCOUNTER — Ambulatory Visit (INDEPENDENT_AMBULATORY_CARE_PROVIDER_SITE_OTHER): Payer: Medicare Other | Admitting: Family Medicine

## 2021-02-04 NOTE — Progress Notes (Signed)
Chief Complaint:   OBESITY Katelyn Walsh is here to discuss her progress with her obesity treatment plan along with follow-up of her obesity related diagnoses. Katelyn Walsh is on the Category 2 Plan + 100 calories and states she is following her eating plan approximately 20% of the time. Katelyn Walsh states she is walking (in general).   Today's visit was #: 56 Starting weight: 224 lbs Starting date: 05/11/2019 Today's weight: 205 lbs Today's date: 01/30/2021 Total lbs lost to date: 19 Total lbs lost since last in-office visit: 0  Interim History: Katelyn Walsh has been traveling a lot and she is happy that she didn't gain more weight. Her last office visit was via video with me on 12/12/2020, and last in office visit was 11/14/2020. She has no issues with the meal plan. She has a lot of traveling plans coming up.  Subjective:   1. Pre-diabetes Katelyn Walsh notes some cravings at night after supper. She is not weighing her protein after cooking. Last A1c was 5.6.  2. Vitamin D deficiency Katelyn Walsh's Vit D level is at goal of 48.7 on her recent labs. She is currently taking OTC vitamin D 5,000 IU each day. She denies nausea, vomiting or muscle weakness.  3. Essential hypertension Katelyn Walsh's blood pressure is at goal, and she is asymptomatic. She is currently taking losartan. Cardiovascular ROS: no chest pain or dyspnea on exertion.  BP Readings from Last 3 Encounters:  01/30/21 115/77  11/14/20 121/76  10/30/20 126/82   4. Other disorder of eating, with emotional eating Katelyn Walsh has been off her medications for a couple of days, and she notes that she can really notice a difference.  Assessment/Plan:  No orders of the defined types were placed in this encounter.   Medications Discontinued During This Encounter  Medication Reason   buPROPion (WELLBUTRIN SR) 150 MG 12 hr tablet Reorder     Meds ordered this encounter  Medications   buPROPion (WELLBUTRIN SR) 150 MG 12 hr tablet    Sig: Take 1 tablet (150 mg total)  by mouth daily. (After lunch)    Dispense:  30 tablet    Refill:  0    Ov for rf     1. Pre-diabetes Katelyn Walsh declines medications at this time. She would like to avoid them at all cost. She will continue to work on weight loss, exercise, and decreasing simple carbohydrates to help decrease the risk of diabetes.   2. Vitamin D deficiency Low Vitamin D level contributes to fatigue and are associated with obesity, breast, and colon cancer. Katelyn Walsh will continue Vitamin D 5,000 IU every day and will follow-up for routine testing of Vitamin D, at least 2-3 times per year to avoid over-replacement.  3. Essential hypertension Katelyn Walsh's blood pressure is stable, and she will continue her losartan at her current dose. She will continue close monitoring as she continues her lifestyle modifications.  4. Other disorder of eating, with emotional eating We will refill Wellbutrin SR for 1 month. Behavior modification techniques were discussed today to help Katelyn Walsh deal with her emotional/non-hunger eating behaviors. Orders and follow up as documented in patient record.   - buPROPion (WELLBUTRIN SR) 150 MG 12 hr tablet; Take 1 tablet (150 mg total) by mouth daily. (After lunch)  Dispense: 30 tablet; Refill: 0  5. Obesity with current BMI of 35.3 Katelyn Walsh is currently in the action stage of change. As such, her goal is to continue with weight loss efforts. She has Walsh to the Category 2 Plan +  100 calories.   I discussed labs with the patient today from 12/26/2020 (A1c 5.6, CMP-within normal limits, CBC, FLP: LDL 36, triglycerides 105, HDL 44, TSH 0.97, and Vit D 48.6).  Exercise goals: Exercise/move more with walking for 20 minutes 2 days per week.  Behavioral modification strategies: avoiding temptations and planning for success.  Story has Walsh to follow-up with our clinic in 2 weeks. She was informed of the importance of frequent follow-up visits to maximize her success with intensive lifestyle modifications  for her multiple health conditions.   Objective:   Blood pressure 115/77, pulse 69, temperature (!) 97.5 F (36.4 C), height 5\' 4"  (1.626 m), weight 205 lb (93 kg), SpO2 97 %. Body mass index is 35.19 kg/m.  General: Cooperative, alert, well developed, in no acute distress. HEENT: Conjunctivae and lids unremarkable. Cardiovascular: Regular rhythm.  Lungs: Normal work of breathing. Neurologic: No focal deficits.   Lab Results  Component Value Date   CREATININE 1.01 (H) 01/30/2020   BUN 17 01/30/2020   NA 143 01/30/2020   K 4.3 01/30/2020   CL 104 01/30/2020   CO2 23 01/30/2020   Lab Results  Component Value Date   ALT 18 01/30/2020   AST 18 01/30/2020   ALKPHOS 91 01/30/2020   BILITOT 0.7 01/30/2020   Lab Results  Component Value Date   HGBA1C 5.6 11/27/2020   HGBA1C 6.2 (H) 01/30/2020   HGBA1C 6.0 (H) 10/02/2019   Lab Results  Component Value Date   INSULIN 9.7 01/30/2020   INSULIN 10.6 10/02/2019   INSULIN 20.3 05/11/2019   Lab Results  Component Value Date   TSH 0.997 10/02/2019   Lab Results  Component Value Date   CHOL 114 10/02/2019   HDL 40 10/02/2019   LDLCALC 56 10/02/2019   TRIG 91 10/02/2019   Lab Results  Component Value Date   VD25OH 51.3 10/02/2019   VD25OH 27 (L) 01/09/2014   VD25OH 34 01/05/2012   Lab Results  Component Value Date   WBC 6.0 04/26/2019   HGB 12.6 04/26/2019   HCT 40.5 04/26/2019   MCV 96.2 04/26/2019   PLT 171 04/26/2019   No results found for: IRON, TIBC, FERRITIN  Obesity Behavioral Intervention:   Approximately 15 minutes were spent on the discussion below.  ASK: We discussed the diagnosis of obesity with Katelyn Walsh today and Katelyn Walsh to give [place] permission to discuss obesity behavioral modification therapy today.  ASSESS: Katelyn Walsh has the diagnosis of obesity and her BMI today is 35.3. Katelyn Walsh is in the action stage of change.   ADVISE: Katelyn Walsh was educated on the multiple health risks of obesity as well as the  benefit of weight loss to improve her health. She was advised of the need for long term treatment and the importance of lifestyle modifications to improve her current health and to decrease her risk of future health problems.  AGREE: Multiple dietary modification options and treatment options were discussed and Jhoselyn Walsh to follow the recommendations documented in the above note.  ARRANGE: Lucinda was educated on the importance of frequent visits to treat obesity as outlined per CMS and USPSTF guidelines and Walsh to schedule her next follow up appointment today.  Attestation Statements:   Reviewed by clinician on day of visit: allergies, medications, problem list, medical history, surgical history, family history, social history, and previous encounter notes.   Wilhemena Durie, am acting as transcriptionist for [employer], DO.  I have reviewed the above documentation for accuracy and  completeness, and I agree with the above. Marjory Sneddon, D.O.  The [place] was signed into law in 2016 which includes the topic of electronic health records.  This provides immediate access to information in MyChart.  This includes consultation notes, operative notes, office notes, lab results and pathology reports.  If you have any questions about what you read please let us know at your next visit so we can discuss your concerns and take corrective action if need be.  We are right here with you.

## 2021-02-05 ENCOUNTER — Encounter (INDEPENDENT_AMBULATORY_CARE_PROVIDER_SITE_OTHER): Payer: Self-pay

## 2021-02-13 ENCOUNTER — Ambulatory Visit (INDEPENDENT_AMBULATORY_CARE_PROVIDER_SITE_OTHER): Payer: Medicare Other | Admitting: Family Medicine

## 2021-02-25 ENCOUNTER — Telehealth (INDEPENDENT_AMBULATORY_CARE_PROVIDER_SITE_OTHER): Payer: Medicare Other | Admitting: Family Medicine

## 2021-02-26 ENCOUNTER — Ambulatory Visit
Admission: RE | Admit: 2021-02-26 | Discharge: 2021-02-26 | Disposition: A | Discharge status: Home / Self Care | Payer: Medicare Other | Source: Ambulatory Visit | Attending: Internal Medicine | Admitting: Internal Medicine

## 2021-02-26 ENCOUNTER — Other Ambulatory Visit: Payer: Self-pay

## 2021-02-26 DIAGNOSIS — Z1231 Encounter for screening mammogram for malignant neoplasm of breast: Secondary | ICD-10-CM

## 2021-02-27 ENCOUNTER — Ambulatory Visit (INDEPENDENT_AMBULATORY_CARE_PROVIDER_SITE_OTHER): Payer: Medicare Other | Admitting: Family Medicine

## 2021-02-27 ENCOUNTER — Encounter (INDEPENDENT_AMBULATORY_CARE_PROVIDER_SITE_OTHER): Payer: Self-pay | Admitting: Family Medicine

## 2021-02-27 ENCOUNTER — Other Ambulatory Visit: Payer: Self-pay

## 2021-02-27 VITALS — BP 120/70 | HR 69 | Temp 97.9°F | Ht 64.0 in | Wt 210.0 lb

## 2021-02-27 DIAGNOSIS — Z6836 Body mass index (BMI) 36.0-36.9, adult: Secondary | ICD-10-CM

## 2021-02-27 DIAGNOSIS — R739 Hyperglycemia, unspecified: Secondary | ICD-10-CM

## 2021-02-27 DIAGNOSIS — E66812 Obesity, class 2: Secondary | ICD-10-CM

## 2021-02-27 DIAGNOSIS — Z6838 Body mass index (BMI) 38.0-38.9, adult: Secondary | ICD-10-CM

## 2021-02-27 DIAGNOSIS — R7309 Other abnormal glucose: Secondary | ICD-10-CM | POA: Diagnosis not present

## 2021-02-27 DIAGNOSIS — F3289 Other specified depressive episodes: Secondary | ICD-10-CM | POA: Diagnosis not present

## 2021-02-27 MED ORDER — OZEMPIC (0.25 OR 0.5 MG/DOSE) 2 MG/1.5ML ~~LOC~~ SOPN: 0.2500 mg | PEN_INJECTOR | SUBCUTANEOUS | 0 refills | Status: DC

## 2021-02-27 MED ORDER — BUPROPION HCL ER (SR) 150 MG PO TB12
150.0000 mg | ORAL_TABLET | Freq: Every day | ORAL | 0 refills | Status: DC
Start: 2021-02-27 — End: 2021-03-31

## 2021-03-03 NOTE — Progress Notes (Signed)
Chief Complaint:   OBESITY Katelyn Walsh is here to discuss her progress with her obesity treatment plan along with follow-up of her obesity related diagnoses. [name] is on the Category 2 Plan + 100 calories and states she is following her eating plan approximately 20% of the time. [name] states she is walking 3-4 miles 7 times per week.  Today's visit was #: 39 Starting weight: 224 lbs Starting date: 05/11/2019 Today's weight: 210 lbs Today's date: 02/27/2021 Total lbs lost to date: 14 Total lbs lost since last in-office visit: 0  Interim History: Pt had a severe sinus infection over the holiday. Since her last visit, she's had a couple trips and then got sick. She also started a Z-pack, steroids, and inhaler. She noticed significant increase in hunger and desire to eat while on prednisone. Pt is home until January 17th, then is going to [place] and staying at [facility].  Subjective:   1. Other depression Pt denies suicidal or homicidal ideations. She is doing well on Wellbutrin most of the time.  2. Elevated random blood glucose level Pt has had many elevated blood sugars throughout the last 5 years.  Assessment/Plan:   1. Other depression Behavior modification techniques were discussed today to help Yarisa deal with her emotional/non-hunger eating behaviors.  Orders and follow up as documented in patient record.   Refill- buPROPion (WELLBUTRIN SR) 150 MG 12 hr tablet; Take 1 tablet (150 mg total) by mouth daily. (After lunch)  Dispense: 30 tablet; Refill: 0  2. Elevated random blood glucose level Pt will start Ozempic 0.25 mg weekly. Risks and benefits discussed.  Start- Semaglutide,0.25 or 0.5MG /DOS, (OZEMPIC, 0.25 OR 0.5 MG/DOSE,) 2 MG/1.5ML SOPN; Inject 0.25 mg into the skin once a week.  Dispense: 2.41 mL; Refill: 0  3. Obesity with current BMI of 36.1  [name] is currently in the action stage of change. As such, her goal is to continue with weight loss efforts. She has agreed  to the Category 2 Plan + 100 calories.   Exercise goals: All adults should avoid inactivity. Some physical activity is better than none, and adults who participate in any amount of physical activity gain some health benefits.  Behavioral modification strategies: increasing lean protein intake, meal planning and cooking strategies, keeping healthy foods in the home, and planning for success.  [name] has agreed to follow-up with our clinic in 3 weeks. She was informed of the importance of frequent follow-up visits to maximize her success with intensive lifestyle modifications for her multiple health conditions.   Objective:   Blood pressure 120/70, pulse 69, temperature 97.9 F (36.6 C), height 5\' 4"  (1.626 m), weight 210 lb (95.3 kg), SpO2 98 %. Body mass index is 36.05 kg/m.  General: Cooperative, alert, well developed, in no acute distress. HEENT: Conjunctivae and lids unremarkable. Cardiovascular: Regular rhythm.  Lungs: Normal work of breathing. Neurologic: No focal deficits.   Lab Results  Component Value Date   CREATININE 1.01 (H) 01/30/2020   BUN 17 01/30/2020   NA 143 01/30/2020   K 4.3 01/30/2020   CL 104 01/30/2020   CO2 23 01/30/2020   Lab Results  Component Value Date   ALT 18 01/30/2020   AST 18 01/30/2020   ALKPHOS 91 01/30/2020   BILITOT 0.7 01/30/2020   Lab Results  Component Value Date   HGBA1C 5.6 11/27/2020   HGBA1C 6.2 (H) 01/30/2020   HGBA1C 6.0 (H) 10/02/2019   Lab Results  Component Value Date   INSULIN  9.7 01/30/2020   INSULIN 10.6 10/02/2019   INSULIN 20.3 05/11/2019   Lab Results  Component Value Date   TSH 0.997 10/02/2019   Lab Results  Component Value Date   CHOL 101 03/05/2020   HDL 44 03/05/2020   LDLCALC 36 03/05/2020   TRIG 105 03/05/2020   Lab Results  Component Value Date   VD25OH 51.3 10/02/2019   VD25OH 27 (L) 01/09/2014   VD25OH 34 01/05/2012   Lab Results  Component Value Date   WBC 6.0 04/26/2019   HGB 12.6  04/26/2019   HCT 40.5 04/26/2019   MCV 96.2 04/26/2019   PLT 171 04/26/2019   No results found for: IRON, TIBC, FERRITIN  Obesity Behavioral Intervention:   Approximately 15 minutes were spent on the discussion below.  ASK: We discussed the diagnosis of obesity with [name] today and Jackee agreed to give [place] permission to discuss obesity behavioral modification therapy today.  ASSESS: [name] has the diagnosis of obesity and her BMI today is 36.1. [name] is in the action stage of change.   ADVISE: [name] was educated on the multiple health risks of obesity as well as the benefit of weight loss to improve her health. She was advised of the need for long term treatment and the importance of lifestyle modifications to improve her current health and to decrease her risk of future health problems.  AGREE: Multiple dietary modification options and treatment options were discussed and [name] agreed to follow the recommendations documented in the above note.  ARRANGE: [name] was educated on the importance of frequent visits to treat obesity as outlined per CMS and USPSTF guidelines and agreed to schedule her next follow up appointment today.  Attestation Statements:   Reviewed by clinician on day of visit: allergies, medications, problem list, medical history, surgical history, family history, social history, and previous encounter notes.  [name], CMA, am acting as transcriptionist for [name], MD.   I have reviewed the above documentation for accuracy and completeness, and I agree with the above. - [name], MD

## 2021-03-10 ENCOUNTER — Ambulatory Visit (INDEPENDENT_AMBULATORY_CARE_PROVIDER_SITE_OTHER): Payer: Medicare Other | Admitting: Family Medicine

## 2021-03-27 DIAGNOSIS — J3089 Other allergic rhinitis: Secondary | ICD-10-CM | POA: Diagnosis not present

## 2021-03-27 DIAGNOSIS — J3081 Allergic rhinitis due to animal (cat) (dog) hair and dander: Secondary | ICD-10-CM | POA: Diagnosis not present

## 2021-03-27 DIAGNOSIS — L299 Pruritus, unspecified: Secondary | ICD-10-CM | POA: Diagnosis not present

## 2021-03-27 DIAGNOSIS — J301 Allergic rhinitis due to pollen: Secondary | ICD-10-CM | POA: Diagnosis not present

## 2021-03-31 ENCOUNTER — Encounter (INDEPENDENT_AMBULATORY_CARE_PROVIDER_SITE_OTHER): Payer: Self-pay | Admitting: Family Medicine

## 2021-03-31 ENCOUNTER — Other Ambulatory Visit: Payer: Self-pay

## 2021-03-31 ENCOUNTER — Telehealth (INDEPENDENT_AMBULATORY_CARE_PROVIDER_SITE_OTHER): Payer: Medicare Other | Admitting: Family Medicine

## 2021-03-31 DIAGNOSIS — Z20822 Contact with and (suspected) exposure to covid-19: Secondary | ICD-10-CM

## 2021-03-31 DIAGNOSIS — F3289 Other specified depressive episodes: Secondary | ICD-10-CM | POA: Diagnosis not present

## 2021-03-31 DIAGNOSIS — Z6838 Body mass index (BMI) 38.0-38.9, adult: Secondary | ICD-10-CM

## 2021-03-31 DIAGNOSIS — E669 Obesity, unspecified: Secondary | ICD-10-CM | POA: Diagnosis not present

## 2021-03-31 DIAGNOSIS — R7309 Other abnormal glucose: Secondary | ICD-10-CM

## 2021-03-31 DIAGNOSIS — Z6836 Body mass index (BMI) 36.0-36.9, adult: Secondary | ICD-10-CM

## 2021-03-31 MED ORDER — BUPROPION HCL ER (SR) 150 MG PO TB12
150.0000 mg | ORAL_TABLET | Freq: Every day | ORAL | 0 refills | Status: DC
Start: 2021-03-31 — End: 2021-04-22

## 2021-03-31 MED ORDER — OZEMPIC (0.25 OR 0.5 MG/DOSE) 2 MG/1.5ML ~~LOC~~ SOPN: 0.2500 mg | PEN_INJECTOR | SUBCUTANEOUS | 0 refills | Status: DC

## 2021-03-31 NOTE — Progress Notes (Signed)
TeleHealth Visit:  Due to the COVID-19 pandemic, this visit was completed with telemedicine (audio/video) technology to reduce patient and provider exposure as well as to preserve personal protective equipment.   Katelyn Walsh has verbally consented to this TeleHealth visit. The patient is located at home, the provider is located at the [facility] office. The participants in this visit include the listed provider and patient. The visit was conducted today via video.  Chief Complaint: OBESITY Katelyn Walsh is here to discuss her progress with her obesity treatment plan along with follow-up of her obesity related diagnoses. Katelyn Walsh is on the Category 2 Plan + 100 calories and states she is following her eating plan approximately 30-40% of the time. Katelyn Walsh states she is not currently exercising.  Today's visit was #: 59 Starting weight: 224 lbs Starting date: 05/11/2019  Interim History: Katelyn Walsh was around her sister-in-law and grandchild 2-3 days ago and both tested positive for COVID. She is experiencing bodyaches, coughing, headache, and fever. She did not test for COVID yet. Katelyn Walsh previously went to [place] as well, since her last appt. She has been experiencing chronic sinusitis sxs. These sxs have challenged Katelyn Walsh in terms of food choices.  Subjective:   1. Elevated random blood glucose level Katelyn Walsh is feeling well controlled on Ozempic in terms of controlling her increased intake of carbohydrates.  2. Other depression Katelyn Walsh is doing well on Wellbutrin. Katelyn Walsh denies suicidal or homicidal ideations.  3. Exposure to confirmed case of COVID-19 Katelyn Walsh was exposed over the weekend. She is a high risk Katelyn Walsh.  Assessment/Plan:   1. Elevated random blood glucose level Continue current treatment plan.  Refill- Semaglutide,0.25 or 0.5MG /DOS, (OZEMPIC, 0.25 OR 0.5 MG/DOSE,) 2 MG/1.5ML SOPN; Inject 0.25 mg into the skin once a week.  Dispense: 2 mL; Refill: 0  2. Other depression Behavior modification techniques were  discussed today to help Katelyn Walsh deal with her emotional/non-hunger eating behaviors.  Orders and follow up as documented in patient record.   Refill- buPROPion (WELLBUTRIN SR) 150 MG 12 hr tablet; Take 1 tablet (150 mg total) by mouth daily. (After lunch)  Dispense: 30 tablet; Refill: 0  3. Exposure to confirmed case of COVID-19 Katelyn Walsh is to MyChart with results and Paxlovid to be sent in pending results.  4. Obesity with current BMI of 36.1 Katelyn Walsh is currently in the action stage of change. As such, her goal is to continue with weight loss efforts. She has agreed to the Category 2 Plan + 100 calories.   Exercise goals:  As is  Behavioral modification strategies: increasing lean protein intake, meal planning and cooking strategies, keeping healthy foods in the home, and planning for success.  Katelyn Walsh has agreed to follow-up with our clinic in 3 weeks. She was informed of the importance of frequent follow-up visits to maximize her success with intensive lifestyle modifications for her multiple health conditions.  Objective:   VITALS: Per patient if applicable, see vitals. GENERAL: Alert and in no acute distress. CARDIOPULMONARY: No increased WOB. Speaking in clear sentences.  PSYCH: Pleasant and cooperative. Speech normal rate and rhythm. Affect is appropriate. Insight and judgement are appropriate. Attention is focused, linear, and appropriate.  NEURO: Oriented as arrived to appointment on time with no prompting.   Lab Results  Component Value Date   CREATININE 1.01 (H) 01/30/2020   BUN 17 01/30/2020   NA 143 01/30/2020   K 4.3 01/30/2020   CL 104 01/30/2020   CO2 23 01/30/2020   Lab Results  Component Value Date   ALT 18 01/30/2020   AST 18 01/30/2020   ALKPHOS 91 01/30/2020   BILITOT 0.7 01/30/2020   Lab Results  Component Value Date   HGBA1C 5.6 11/27/2020   HGBA1C 6.2 (H) 01/30/2020   HGBA1C 6.0 (H) 10/02/2019   Lab Results  Component Value Date   INSULIN 9.7 01/30/2020    INSULIN 10.6 10/02/2019   INSULIN 20.3 05/11/2019   Lab Results  Component Value Date   TSH 0.997 10/02/2019   Lab Results  Component Value Date   CHOL 101 03/05/2020   HDL 44 03/05/2020   LDLCALC 36 03/05/2020   TRIG 105 03/05/2020   Lab Results  Component Value Date   VD25OH 51.3 10/02/2019   VD25OH 27 (L) 01/09/2014   VD25OH 34 01/05/2012   Lab Results  Component Value Date   WBC 6.0 04/26/2019   HGB 12.6 04/26/2019   HCT 40.5 04/26/2019   MCV 96.2 04/26/2019   PLT 171 04/26/2019   No results found for: IRON, TIBC, FERRITIN  Attestation Statements:   Reviewed by clinician on day of visit: allergies, medications, problem list, medical history, surgical history, family history, social history, and previous encounter notes.  [name], CMA, am acting as transcriptionist for [name], MD.   I have reviewed the above documentation for accuracy and completeness, and I agree with the above. - [name], MD

## 2021-04-01 ENCOUNTER — Encounter (INDEPENDENT_AMBULATORY_CARE_PROVIDER_SITE_OTHER): Payer: Self-pay | Admitting: Family Medicine

## 2021-04-01 ENCOUNTER — Other Ambulatory Visit (INDEPENDENT_AMBULATORY_CARE_PROVIDER_SITE_OTHER): Payer: Self-pay | Admitting: Family Medicine

## 2021-04-01 ENCOUNTER — Telehealth (INDEPENDENT_AMBULATORY_CARE_PROVIDER_SITE_OTHER): Payer: Self-pay

## 2021-04-01 DIAGNOSIS — Z20822 Contact with and (suspected) exposure to covid-19: Secondary | ICD-10-CM

## 2021-04-01 MED ORDER — NIRMATRELVIR/RITONAVIR (PAXLOVID)TABLET
3.0000 | ORAL_TABLET | Freq: Two times a day (BID) | ORAL | 0 refills | Status: AC
Start: 2021-04-01 — End: 2021-04-06

## 2021-04-01 NOTE — Telephone Encounter (Signed)
Dr.Ukleja 

## 2021-04-01 NOTE — Telephone Encounter (Signed)
Spoke w/ pt. Covid test was positive 04/01/21-[facility]

## 2021-04-14 DIAGNOSIS — E559 Vitamin D deficiency, unspecified: Secondary | ICD-10-CM | POA: Diagnosis not present

## 2021-04-14 DIAGNOSIS — E1169 Type 2 diabetes mellitus with other specified complication: Secondary | ICD-10-CM | POA: Diagnosis not present

## 2021-04-14 DIAGNOSIS — E785 Hyperlipidemia, unspecified: Secondary | ICD-10-CM | POA: Diagnosis not present

## 2021-04-14 DIAGNOSIS — R5383 Other fatigue: Secondary | ICD-10-CM | POA: Diagnosis not present

## 2021-04-21 DIAGNOSIS — F330 Major depressive disorder, recurrent, mild: Secondary | ICD-10-CM | POA: Diagnosis not present

## 2021-04-21 DIAGNOSIS — E559 Vitamin D deficiency, unspecified: Secondary | ICD-10-CM | POA: Diagnosis not present

## 2021-04-21 DIAGNOSIS — E1169 Type 2 diabetes mellitus with other specified complication: Secondary | ICD-10-CM | POA: Diagnosis not present

## 2021-04-21 DIAGNOSIS — I10 Essential (primary) hypertension: Secondary | ICD-10-CM | POA: Diagnosis not present

## 2021-04-21 DIAGNOSIS — Z0000 Encounter for general adult medical examination without abnormal findings: Secondary | ICD-10-CM | POA: Diagnosis not present

## 2021-04-21 DIAGNOSIS — E785 Hyperlipidemia, unspecified: Secondary | ICD-10-CM | POA: Diagnosis not present

## 2021-04-21 DIAGNOSIS — F419 Anxiety disorder, unspecified: Secondary | ICD-10-CM | POA: Diagnosis not present

## 2021-04-21 DIAGNOSIS — E538 Deficiency of other specified B group vitamins: Secondary | ICD-10-CM | POA: Diagnosis not present

## 2021-04-22 ENCOUNTER — Encounter (INDEPENDENT_AMBULATORY_CARE_PROVIDER_SITE_OTHER): Payer: Self-pay | Admitting: Family Medicine

## 2021-04-22 ENCOUNTER — Ambulatory Visit (INDEPENDENT_AMBULATORY_CARE_PROVIDER_SITE_OTHER): Payer: Medicare Other | Admitting: Family Medicine

## 2021-04-22 ENCOUNTER — Other Ambulatory Visit: Payer: Self-pay

## 2021-04-22 VITALS — BP 104/67 | HR 76 | Temp 98.1°F | Ht 64.0 in | Wt 208.0 lb

## 2021-04-22 DIAGNOSIS — R7303 Prediabetes: Secondary | ICD-10-CM

## 2021-04-22 DIAGNOSIS — I10 Essential (primary) hypertension: Secondary | ICD-10-CM | POA: Diagnosis not present

## 2021-04-22 DIAGNOSIS — F3289 Other specified depressive episodes: Secondary | ICD-10-CM | POA: Diagnosis not present

## 2021-04-22 DIAGNOSIS — E669 Obesity, unspecified: Secondary | ICD-10-CM | POA: Diagnosis not present

## 2021-04-22 DIAGNOSIS — Z6835 Body mass index (BMI) 35.0-35.9, adult: Secondary | ICD-10-CM | POA: Diagnosis not present

## 2021-04-22 DIAGNOSIS — Z789 Other specified health status: Secondary | ICD-10-CM

## 2021-04-22 DIAGNOSIS — Z6838 Body mass index (BMI) 38.0-38.9, adult: Secondary | ICD-10-CM

## 2021-04-23 ENCOUNTER — Other Ambulatory Visit (INDEPENDENT_AMBULATORY_CARE_PROVIDER_SITE_OTHER): Payer: Self-pay | Admitting: Family Medicine

## 2021-04-23 DIAGNOSIS — F3289 Other specified depressive episodes: Secondary | ICD-10-CM

## 2021-04-24 MED ORDER — OZEMPIC (0.25 OR 0.5 MG/DOSE) 2 MG/1.5ML ~~LOC~~ SOPN
0.2500 mg | PEN_INJECTOR | SUBCUTANEOUS | 0 refills | Status: DC
Start: 2021-04-24 — End: 2021-06-02

## 2021-04-24 MED ORDER — BUPROPION HCL ER (SR) 150 MG PO TB12: 150.0000 mg | ORAL_TABLET | Freq: Every day | ORAL | 0 refills | Status: DC

## 2021-04-28 ENCOUNTER — Other Ambulatory Visit: Payer: Self-pay

## 2021-04-28 DIAGNOSIS — Z789 Other specified health status: Secondary | ICD-10-CM

## 2021-04-28 NOTE — Patient Outreach (Signed)
[facility]) Care Management ? ?04/28/2021 ? ?Katelyn Walsh ?08/10/1950 ?[id number] ? ? ?Received MD referral as patient has difficult time affording resources, sent referral to Care Guides. ? ?[name] ?[place] Management Assistant ?[phone] ? ?

## 2021-04-28 NOTE — Progress Notes (Signed)
Chief Complaint:   OBESITY Katelyn Walsh is here to discuss her progress with her obesity treatment plan along with follow-up of her obesity related diagnoses.   Today's visit was #: 68 Starting weight: 224 lbs Starting date: 05/11/2019 Today's weight: 208 lbs Today's date: 04/22/2021 Weight change since last visit: -2 lbs Total lbs lost to date: 16 lbs Body mass index is 35.7 kg/m.  Total weight loss percentage to date: -7.14%  Current Meal Plan: the Category 2 Plan for 40% of the time.  Current Exercise Plan: Increased walking. Current Anti-Obesity Medications: Ozempic 0.25 mg subcutaneously weekly. Side effects: None.  Interim History: Katelyn Walsh reports having a difficult time finding ways to exercise. She lives alone and enjoys her time alone, but would also like to find more ways to get out of the house.   Assessment/Plan:   1. Needs assistance with community resources Katelyn Walsh has a difficult time finding affordable exercise options. Gave her the number for [facility], an exercise class for people over the age of 8. [employer]. I will also refer to [facility] to see if there are other affordable options for exercise. She does NOT have access to [employer].  Plan: Referral sent to [facility] for more programs.   - AMB Referral to [facility]  2. Other depression, with emotional eating tendencies Behavior modification techniques were discussed today to help Katelyn Walsh deal with her emotional/non-hunger eating behaviors.  - Refill buPROPion (WELLBUTRIN SR) 150 MG 12 hr tablet; Take 1 tablet (150 mg total) by mouth daily. (After lunch)  Dispense: 30 tablet; Refill: 0  3. Pre-diabetes At goal. Goal is HgbA1c < 5.7.  Medication: Ozempic 0.25 mg subcutaneously weekly.    Plan:  She will continue to focus on protein-rich, low simple carbohydrate foods. We reviewed the importance of hydration, regular exercise for stress reduction, and restorative  sleep.   Lab Results  Component Value Date   HGBA1C 5.6 11/27/2020   Lab Results  Component Value Date   INSULIN 9.7 01/30/2020   INSULIN 10.6 10/02/2019   INSULIN 20.3 05/11/2019   - Refill Semaglutide,0.25 or 0.'5MG'$ /DOS, (OZEMPIC, 0.25 OR 0.5 MG/DOSE,) 2 MG/1.5ML SOPN; Inject 0.25 mg into the skin once a week.  Dispense: 2 mL; Refill: 0  4. Essential hypertension Controlled. Medications: Losartan.   Plan: Avoid buying foods that are: processed, frozen, or prepackaged to avoid excess salt. We will watch for signs of hypotension as she continues lifestyle modifications.  BP Readings from Last 3 Encounters:  04/22/21 104/67  02/27/21 120/70  01/30/21 115/77   Lab Results  Component Value Date   CREATININE 1.01 (H) 01/30/2020   5. Obesity with current BMI of 35.8 Course: Katelyn Walsh is currently in the action stage of change. As such, her goal is to continue with weight loss efforts.   Nutrition goals: She has agreed to the Category 2 Plan.   Exercise goals: As is.  Behavioral modification strategies: increasing lean protein intake, decreasing simple carbohydrates, increasing vegetables, and increasing water intake.  Katelyn Walsh has agreed to follow-up with our clinic in 3 weeks. She was informed of the importance of frequent follow-up visits to maximize her success with intensive lifestyle modifications for her multiple health conditions.   Objective:   Blood pressure 104/67, pulse 76, temperature 98.1 F (36.7 C), temperature source Oral, height '5\' 4"'$  (1.626 m), weight 208 lb (94.3 kg), SpO2 96 %. Body mass index is 35.7 kg/m.  General: Cooperative, alert, well developed, in no acute distress.  HEENT: Conjunctivae and lids unremarkable. Cardiovascular: Regular rhythm.  Lungs: Normal work of breathing. Neurologic: No focal deficits.   Lab Results  Component Value Date   CREATININE 1.01 (H) 01/30/2020   BUN 17 01/30/2020   NA 143 01/30/2020   K 4.3 01/30/2020   CL 104  01/30/2020   CO2 23 01/30/2020   Lab Results  Component Value Date   ALT 18 01/30/2020   AST 18 01/30/2020   ALKPHOS 91 01/30/2020   BILITOT 0.7 01/30/2020   Lab Results  Component Value Date   HGBA1C 5.6 11/27/2020   HGBA1C 6.2 (H) 01/30/2020   HGBA1C 6.0 (H) 10/02/2019   Lab Results  Component Value Date   INSULIN 9.7 01/30/2020   INSULIN 10.6 10/02/2019   INSULIN 20.3 05/11/2019   Lab Results  Component Value Date   TSH 0.997 10/02/2019   Lab Results  Component Value Date   CHOL 101 03/05/2020   HDL 44 03/05/2020   LDLCALC 36 03/05/2020   TRIG 105 03/05/2020   Lab Results  Component Value Date   VD25OH 51.3 10/02/2019   VD25OH 27 (L) 01/09/2014   VD25OH 34 01/05/2012   Lab Results  Component Value Date   WBC 6.0 04/26/2019   HGB 12.6 04/26/2019   HCT 40.5 04/26/2019   MCV 96.2 04/26/2019   PLT 171 04/26/2019   Attestation Statements:   Reviewed by clinician on day of visit: allergies, medications, problem list, medical history, surgical history, family history, social history, and previous encounter notes.  Leodis Binet Friedenbach, CMA, am acting as Location manager for [employer], DO.  I have reviewed the above documentation for accuracy and completeness, and I agree with the above. -  Briscoe Deutscher, DO, MS, FAAFP, DABOM - Family and Bariatric Medicine.

## 2021-04-30 ENCOUNTER — Telehealth: Payer: Self-pay

## 2021-04-30 NOTE — Telephone Encounter (Signed)
? ?  Telephone encounter was:  Successful.  ?04/30/2021 ?Name: Katelyn Walsh MRN: [id number] DOB: 25-Aug-1950 ? ?Katelyn Walsh is a 71 y.o. year old female who is a primary care patient of Velna Hatchet, MD . The community resource team was consulted for assistance with  senior activities ? ?Care guide performed the following interventions: Patient provided with information about care guide support team and interviewed to confirm resource needs.Patient inquired about activities fo seniors. I provided resources over the phone. She also has my contact information if needs more resources ? ?Follow Up Plan:  No further follow up planned at this time. The patient has been provided with needed resources. ? ? ? ?Larena Sox ?Care Guide, Embedded Care Coordination ?[place], Care Management  ?[phone] ?[address], [place],  [address] ?Phone: [phone] ?Email: [email or web address] ? ?  ?

## 2021-05-14 ENCOUNTER — Ambulatory Visit (INDEPENDENT_AMBULATORY_CARE_PROVIDER_SITE_OTHER): Payer: Medicare Other | Admitting: Nurse Practitioner

## 2021-05-15 ENCOUNTER — Other Ambulatory Visit: Payer: Self-pay

## 2021-05-15 ENCOUNTER — Encounter (INDEPENDENT_AMBULATORY_CARE_PROVIDER_SITE_OTHER): Payer: Self-pay | Admitting: Family Medicine

## 2021-05-15 ENCOUNTER — Ambulatory Visit (INDEPENDENT_AMBULATORY_CARE_PROVIDER_SITE_OTHER): Payer: Medicare Other | Admitting: Family Medicine

## 2021-05-15 VITALS — BP 109/72 | HR 77 | Temp 98.8°F | Ht 64.0 in | Wt 207.0 lb

## 2021-05-15 DIAGNOSIS — F3289 Other specified depressive episodes: Secondary | ICD-10-CM

## 2021-05-15 DIAGNOSIS — R7982 Elevated C-reactive protein (CRP): Secondary | ICD-10-CM | POA: Diagnosis not present

## 2021-05-15 DIAGNOSIS — Z6835 Body mass index (BMI) 35.0-35.9, adult: Secondary | ICD-10-CM | POA: Diagnosis not present

## 2021-05-15 DIAGNOSIS — E669 Obesity, unspecified: Secondary | ICD-10-CM

## 2021-05-15 MED ORDER — BUPROPION HCL ER (SR) 150 MG PO TB12: 150.0000 mg | ORAL_TABLET | Freq: Every day | ORAL | 0 refills | Status: DC

## 2021-05-19 NOTE — Progress Notes (Signed)
? ? ? ?Chief Complaint:  ? ?OBESITY ?Katelyn Walsh is here to discuss her progress with her obesity treatment plan along with follow-up of her obesity related diagnoses. Katelyn Walsh is on the Category 2 Plan and states she is following her eating plan approximately 40% of the time. Katelyn Walsh states she is doing 0 minutes 0 times per week. ? ?Today's visit was #: 46 ?Starting weight: 224 lbs ?Starting date: 05/11/2019 ?Today's weight: 207 lbs ?Today's date: 05/15/2021 ?Total lbs lost to date: 56 ?Total lbs lost since last in-office visit: 1 ? ?Interim History: Katelyn Walsh hasn't left the house much due to worsening allergies. She has lost another lb. She was given [facility] community resources for assistance with senior activities at her last visit. She is trying to get a call back from [facility] hope program but they aren't returning her call. ? ?Subjective:  ? ?1. Elevated C-reactive protein ?Katelyn Walsh was told her creatinine was elevated by her PCP. Her lab results are not in [place].  ? ?2. Other depression, with emotional eating tendencies ?Katelyn Walsh notes some increase in PM cravings, especially while at home but less while traveling. She feels the Wellbutrin has helped. ? ?Assessment/Plan:  ? ?1. Elevated C-reactive protein ?We will obtain labs from her PCP. Katelyn Walsh is to increase her water intake to 80+ oz per day. ? ?2. Other depression, with emotional eating tendencies ?Katelyn Walsh continue Wellbutrin SR 150 mg daily, and we will refill for 1 month. ? ?- buPROPion (WELLBUTRIN SR) 150 MG 12 hr tablet; Take 1 tablet (150 mg total) by mouth daily. (After lunch)  Dispense: 30 tablet; Refill: 0 ? ?3. Obesity with current BMI of 35.6 ?Katelyn Walsh is currently in the action stage of change. As such, her goal is to continue with weight loss efforts. She has agreed to the Category 2 Plan.  ? ?Behavioral modification strategies: increasing lean protein intake and increasing water intake. ? ?Katelyn Walsh has agreed to follow-up with our clinic in 3 weeks. She was informed of the  importance of frequent follow-up visits to maximize her success with intensive lifestyle modifications for her multiple health conditions.  ? ?Objective:  ? ?Blood pressure 109/72, pulse 77, temperature 98.8 ?F (37.1 ?C), height '5\' 4"'$  (1.626 m), weight 207 lb (93.9 kg), SpO2 96 %. ?Body mass index is 35.53 kg/m?. ? ?General: Cooperative, alert, well developed, in no acute distress. ?HEENT: Conjunctivae and lids unremarkable. ?Cardiovascular: Regular rhythm.  ?Lungs: Normal work of breathing. ?Neurologic: No focal deficits.  ? ?Lab Results  ?Component Value Date  ? CREATININE 1.01 (H) 01/30/2020  ? BUN 17 01/30/2020  ? NA 143 01/30/2020  ? K 4.3 01/30/2020  ? CL 104 01/30/2020  ? CO2 23 01/30/2020  ? ?Lab Results  ?Component Value Date  ? ALT 18 01/30/2020  ? AST 18 01/30/2020  ? ALKPHOS 91 01/30/2020  ? BILITOT 0.7 01/30/2020  ? ?Lab Results  ?Component Value Date  ? HGBA1C 5.6 11/27/2020  ? HGBA1C 6.2 (H) 01/30/2020  ? HGBA1C 6.0 (H) 10/02/2019  ? ?Lab Results  ?Component Value Date  ? INSULIN 9.7 01/30/2020  ? INSULIN 10.6 10/02/2019  ? INSULIN 20.3 05/11/2019  ? ?Lab Results  ?Component Value Date  ? TSH 0.997 10/02/2019  ? ?Lab Results  ?Component Value Date  ? CHOL 101 03/05/2020  ? HDL 44 03/05/2020  ? [place] 36 03/05/2020  ? TRIG 105 03/05/2020  ? ?Lab Results  ?Component Value Date  ? VD25OH 51.3 10/02/2019  ? VD25OH 27 (L) 01/09/2014  ?  VD25OH 34 01/05/2012  ? ?Lab Results  ?Component Value Date  ? WBC 6.0 04/26/2019  ? HGB 12.6 04/26/2019  ? HCT 40.5 04/26/2019  ? MCV 96.2 04/26/2019  ? PLT 171 04/26/2019  ? ?No results found for: IRON, TIBC, FERRITIN ? ?Obesity Behavioral Intervention:  ? ?Approximately 15 minutes were spent on the discussion below. ? ?ASK: ?We discussed the diagnosis of obesity with Katelyn Walsh today and Katelyn Walsh agreed to give [place] permission to discuss obesity behavioral modification therapy today. ? ?ASSESS: ?Katelyn Walsh has the diagnosis of obesity and her BMI today is 35.6. Katelyn Walsh is in the action  stage of change.  ? ?ADVISE: ?Katelyn Walsh was educated on the multiple health risks of obesity as well as the benefit of weight loss to improve her health. She was advised of the need for long term treatment and the importance of lifestyle modifications to improve her current health and to decrease her risk of future health problems. ? ?AGREE: ?Multiple dietary modification options and treatment options were discussed and Katelyn Walsh agreed to follow the recommendations documented in the above note. ? ?ARRANGE: ?Katelyn Walsh was educated on the importance of frequent visits to treat obesity as outlined per CMS and USPSTF guidelines and agreed to schedule her next follow up appointment today. ? ?Attestation Statements:  ? ?Reviewed by clinician on day of visit: allergies, medications, problem list, medical history, surgical history, family history, social history, and previous encounter notes. ? ? ?I, [name] Dredge, am acting as transcriptionist for Dennard Nip, MD. ? ?I have reviewed the above documentation for accuracy and completeness, and I agree with the above. -  Dennard Nip, MD ? ? ?

## 2021-05-21 ENCOUNTER — Ambulatory Visit (INDEPENDENT_AMBULATORY_CARE_PROVIDER_SITE_OTHER): Payer: Medicare Other | Admitting: Nurse Practitioner

## 2021-05-29 NOTE — Progress Notes (Addendum)
?TeleHealth Visit:  ?Due to the COVID-19 pandemic, this visit was completed with telemedicine (audio/video) technology to reduce patient and provider exposure as well as to preserve personal protective equipment.  ? ?[name] has verbally consented to this TeleHealth visit. The patient is located at home, the provider is located at home. The participants in this visit include the listed provider and patient. The visit was conducted today via phone call. Patient was unable to connect to video. Length of call was 22 minutes. ? ? ?OBESITY ?Katelyn Walsh is here to discuss her progress with her obesity treatment plan along with follow-up of her obesity related diagnoses.  ? ?Today's visit was # 44 ?Starting weight: 224 lbs ?Starting date: 05/11/19 ?Total weight loss: 17 lbs at last in office visit. ?Weight at last in office visit: 207 lbs on 05/15/21 ?Today's reported weight: No weight reported. ? ?Nutrition Plan: the Category 2 Plan about 40%.  ?Hunger is moderately controlled. Cravings are moderately controlled.  ?Current exercise:  walking some but not much due to pollen. ?Interim History: Katelyn Walsh is struggling with terrible seasonal allergies.  She is also had family come to visit recently so she reports not adhering to the category 2 plan very well.  She reports struggling at night with cravings and hunger. ? ?Assessment/Plan:  ?1. Prediabetes ?[name] has a diagnosis of prediabetes based on her elevated HgA1c. ?She admits to polyphagia. ?Medication(s): Currently on 0.25 mg of Ozempic weekly.  Denies side effects. ?Lab Results  ?Component Value Date  ? HGBA1C 5.6 11/27/2020  ? ?Lab Results  ?Component Value Date  ? INSULIN 9.7 01/30/2020  ? INSULIN 10.6 10/02/2019  ? INSULIN 20.3 05/11/2019  ? ? ?Plan: ?Increase dose of Ozempic to 0.5 mg weekly. ?Katelyn Walsh had recent lab work at her PCP and she will bring a copy to her next office visit. ? ?2. Seasonal allergies ?[name] says her eyes are nearly swollen shut and she can barely  go outside due to her severe allergies.  She sees an allergist.  She has been on allergy injections in the past but did not feel it helped much.  She also has asthma. ?Medications: Singulair 10 mg daily Xyzal 5 mg daily.  She says she is also on Symbicort which I do not have on my list. ? ?Plan: ?Continue all medications as ordered and follow up with allergist as directed. ? ?3. Obesity: Current BMI 35.51 ?Katelyn Walsh is currently in the action stage of change. As such, her goal is to continue with weight loss efforts.  ?She has agreed to the Category 2 Plan.  ? ?Exercise goals: No exercise has been prescribed at this time. ? ?Behavioral modification strategies: decreasing simple carbohydrates. ? ?Katelyn Walsh has agreed to follow-up with our clinic in 3 weeks.  ? ?No orders of the defined types were placed in this encounter. ? ? ?There are no discontinued medications.  ? ?No orders of the defined types were placed in this encounter. ?   ? ?Objective:  ? ?VITALS: Per patient if applicable, see vitals. ?GENERAL: Alert and in no acute distress. ?CARDIOPULMONARY: No increased WOB. Speaking in clear sentences.  ?PSYCH: Pleasant and cooperative. Speech normal rate and rhythm. Affect is appropriate. Insight and judgement are appropriate. Attention is focused, linear, and appropriate.  ?NEURO: Oriented as arrived to appointment on time with no prompting.  ? ?Lab Results  ?Component Value Date  ? CREATININE 1.01 (H) 01/30/2020  ? BUN 17 01/30/2020  ? NA 143 01/30/2020  ? K 4.3 01/30/2020  ?  CL 104 01/30/2020  ? CO2 23 01/30/2020  ? ?Lab Results  ?Component Value Date  ? ALT 18 01/30/2020  ? AST 18 01/30/2020  ? ALKPHOS 91 01/30/2020  ? BILITOT 0.7 01/30/2020  ? ?Lab Results  ?Component Value Date  ? HGBA1C 5.6 11/27/2020  ? HGBA1C 6.2 (H) 01/30/2020  ? HGBA1C 6.0 (H) 10/02/2019  ? ?Lab Results  ?Component Value Date  ? INSULIN 9.7 01/30/2020  ? INSULIN 10.6 10/02/2019  ? INSULIN 20.3 05/11/2019  ? ?Lab Results  ?Component Value Date  ?  TSH 0.997 10/02/2019  ? ?Lab Results  ?Component Value Date  ? CHOL 101 03/05/2020  ? HDL 44 03/05/2020  ? [place] 36 03/05/2020  ? TRIG 105 03/05/2020  ? ?Lab Results  ?Component Value Date  ? WBC 6.0 04/26/2019  ? HGB 12.6 04/26/2019  ? HCT 40.5 04/26/2019  ? MCV 96.2 04/26/2019  ? PLT 171 04/26/2019  ? ?No results found for: IRON, TIBC, FERRITIN ?Lab Results  ?Component Value Date  ? VD25OH 51.3 10/02/2019  ? VD25OH 27 (L) 01/09/2014  ? VD25OH 34 01/05/2012  ? ? ?Attestation Statements:  ? ?Reviewed by clinician on day of visit: allergies, medications, problem list, medical history, surgical history, family history, social history, and previous encounter notes. ? ? ?

## 2021-06-02 ENCOUNTER — Encounter (INDEPENDENT_AMBULATORY_CARE_PROVIDER_SITE_OTHER): Payer: Self-pay | Admitting: Family Medicine

## 2021-06-02 ENCOUNTER — Telehealth (INDEPENDENT_AMBULATORY_CARE_PROVIDER_SITE_OTHER): Payer: Medicare Other | Admitting: Family Medicine

## 2021-06-02 DIAGNOSIS — R7303 Prediabetes: Secondary | ICD-10-CM | POA: Diagnosis not present

## 2021-06-02 DIAGNOSIS — Z6835 Body mass index (BMI) 35.0-35.9, adult: Secondary | ICD-10-CM

## 2021-06-02 DIAGNOSIS — E669 Obesity, unspecified: Secondary | ICD-10-CM | POA: Diagnosis not present

## 2021-06-02 DIAGNOSIS — J302 Other seasonal allergic rhinitis: Secondary | ICD-10-CM

## 2021-06-02 MED ORDER — OZEMPIC (0.25 OR 0.5 MG/DOSE) 2 MG/1.5ML ~~LOC~~ SOPN: 0.5000 mg | PEN_INJECTOR | SUBCUTANEOUS | 0 refills | Status: DC

## 2021-06-23 ENCOUNTER — Ambulatory Visit (INDEPENDENT_AMBULATORY_CARE_PROVIDER_SITE_OTHER): Payer: Medicare Other | Admitting: Family Medicine

## 2021-06-23 ENCOUNTER — Encounter (INDEPENDENT_AMBULATORY_CARE_PROVIDER_SITE_OTHER): Payer: Self-pay | Admitting: Family Medicine

## 2021-06-23 VITALS — BP 101/67 | HR 73 | Temp 98.1°F | Ht 64.0 in | Wt 210.0 lb

## 2021-06-23 DIAGNOSIS — F3289 Other specified depressive episodes: Secondary | ICD-10-CM | POA: Diagnosis not present

## 2021-06-23 DIAGNOSIS — R7303 Prediabetes: Secondary | ICD-10-CM | POA: Diagnosis not present

## 2021-06-23 DIAGNOSIS — Z6836 Body mass index (BMI) 36.0-36.9, adult: Secondary | ICD-10-CM | POA: Diagnosis not present

## 2021-06-23 DIAGNOSIS — E669 Obesity, unspecified: Secondary | ICD-10-CM

## 2021-06-23 MED ORDER — BUPROPION HCL ER (SR) 150 MG PO TB12: 150.0000 mg | ORAL_TABLET | Freq: Every day | ORAL | 0 refills | Status: DC

## 2021-06-23 MED ORDER — OZEMPIC (0.25 OR 0.5 MG/DOSE) 2 MG/1.5ML ~~LOC~~ SOPN
0.5000 mg | PEN_INJECTOR | SUBCUTANEOUS | 0 refills | Status: DC
Start: 2021-06-23 — End: 2021-10-28

## 2021-07-02 NOTE — Progress Notes (Signed)
Chief Complaint:   OBESITY Katelyn Walsh is here to discuss her progress with her obesity treatment plan along with follow-up of her obesity related diagnoses. Katelyn Walsh is on the Category 2 Plan and states she is following her eating plan approximately 20% of the time. Katelyn Walsh states she is walking 4.5 miles 2-3 times per week.  Today's visit was #: 63 Starting weight: 224 lbs Starting date: 05/11/2019 Today's weight: 207 lbs Today's date: 06/23/2021 Total lbs lost to date: 17 lbs Total lbs lost since last in-office visit: 0  Interim History: Katelyn Walsh has had numerous trips over the last few weeks.  She is going to [facility] and then a a few weeks later going to [facility] for gambling/casino's.  Recognizes that with all her traveling it has been more difficult to stay on plan.  Recently had some labs drawn at her PCP's office at last appointment.   Subjective:   1. Pre-diabetes Katelyn Walsh's last A1c was 5.6, it has improved from 6.2.  Katelyn Walsh denies any GI side effects.   2. Other depression, with emotional eating tendencies Katelyn Walsh is currently on Wellbutrin with good control of emotional eating.  Blood pressure was well controlled today and denies any side effects.   Assessment/Plan:   1. Pre-diabetes Katelyn Walsh has agreed to continue Ozempic 0.5 mg, see below.  - Semaglutide,0.25 or 0.'5MG'$ /DOS, (OZEMPIC, 0.25 OR 0.5 MG/DOSE,) 2 MG/1.5ML SOPN; Inject 0.5 mg into the skin once a week.  Dispense: 4.5 mL; Refill: 0  2. Other depression, with emotional eating tendencies Katelyn Walsh has agreed to continue Wellbutrin 150 mg daily.  See below.   - buPROPion (WELLBUTRIN SR) 150 MG 12 hr tablet; Take 1 tablet (150 mg total) by mouth daily. (After lunch)  Dispense: 30 tablet; Refill: 0  3. Obesity with current BMI of 36.1 Katelyn Walsh is currently in the action stage of change. As such, her goal is to continue with weight loss efforts. She has agreed to the Category 2 Plan.   Exercise goals: All adults should  avoid inactivity. Some physical activity is better than none, and adults who participate in any amount of physical activity gain some health benefits.  Behavioral modification strategies: increasing lean protein intake, meal planning and cooking strategies, keeping healthy foods in the home, travel eating strategies, and planning for success.  Katelyn Walsh has agreed to follow-up with our clinic in 3 weeks. She was informed of the importance of frequent follow-up visits to maximize her success with intensive lifestyle modifications for her multiple health conditions.  Objective:   Blood pressure 101/67, pulse 73, temperature 98.1 F (36.7 C), height '5\' 4"'$  (1.626 m), weight 210 lb (95.3 kg), SpO2 99 %. Body mass index is 36.05 kg/m.  General: Cooperative, alert, well developed, in no acute distress. HEENT: Conjunctivae and lids unremarkable. Cardiovascular: Regular rhythm.  Lungs: Normal work of breathing. Neurologic: No focal deficits.   Lab Results  Component Value Date   CREATININE 1.01 (H) 01/30/2020   BUN 17 01/30/2020   NA 143 01/30/2020   K 4.3 01/30/2020   CL 104 01/30/2020   CO2 23 01/30/2020   Lab Results  Component Value Date   ALT 18 01/30/2020   AST 18 01/30/2020   ALKPHOS 91 01/30/2020   BILITOT 0.7 01/30/2020   Lab Results  Component Value Date   HGBA1C 5.6 11/27/2020   HGBA1C 6.2 (H) 01/30/2020   HGBA1C 6.0 (H) 10/02/2019   Lab Results  Component Value Date   INSULIN 9.7 01/30/2020  INSULIN 10.6 10/02/2019   INSULIN 20.3 05/11/2019   Lab Results  Component Value Date   TSH 0.997 10/02/2019   Lab Results  Component Value Date   CHOL 101 03/05/2020   HDL 44 03/05/2020   LDLCALC 36 03/05/2020   TRIG 105 03/05/2020   Lab Results  Component Value Date   VD25OH 51.3 10/02/2019   VD25OH 27 (L) 01/09/2014   VD25OH 34 01/05/2012   Lab Results  Component Value Date   WBC 6.0 04/26/2019   HGB 12.6 04/26/2019   HCT 40.5 04/26/2019   MCV 96.2 04/26/2019    PLT 171 04/26/2019   No results found for: IRON, TIBC, FERRITIN  Obesity Behavioral Intervention:   Approximately 15 minutes were spent on the discussion below.  ASK: We discussed the diagnosis of obesity with Katelyn Walsh today and Katelyn Walsh agreed to give [place] permission to discuss obesity behavioral modification therapy today.  ASSESS: Katelyn Walsh has the diagnosis of obesity and her BMI today is 36.1. Katelyn Walsh is in the action stage of change.   ADVISE: Katelyn Walsh was educated on the multiple health risks of obesity as well as the benefit of weight loss to improve her health. She was advised of the need for long term treatment and the importance of lifestyle modifications to improve her current health and to decrease her risk of future health problems.  AGREE: Multiple dietary modification options and treatment options were discussed and Katelyn Walsh agreed to follow the recommendations documented in the above note.  ARRANGE: Katelyn Walsh was educated on the importance of frequent visits to treat obesity as outlined per CMS and USPSTF guidelines and agreed to schedule her next follow up appointment today.  Attestation Statements:   Reviewed by clinician on day of visit: allergies, medications, problem list, medical history, surgical history, family history, social history, and previous encounter notes.  I, Davy Pique, RMA, am acting as transcriptionist for [name], MD. I have reviewed the above documentation for accuracy and completeness, and I agree with the above. - [name], MD

## 2021-07-10 DIAGNOSIS — H2513 Age-related nuclear cataract, bilateral: Secondary | ICD-10-CM | POA: Diagnosis not present

## 2021-07-10 DIAGNOSIS — H10413 Chronic giant papillary conjunctivitis, bilateral: Secondary | ICD-10-CM | POA: Diagnosis not present

## 2021-07-10 DIAGNOSIS — H5203 Hypermetropia, bilateral: Secondary | ICD-10-CM | POA: Diagnosis not present

## 2021-07-10 DIAGNOSIS — H353131 Nonexudative age-related macular degeneration, bilateral, early dry stage: Secondary | ICD-10-CM | POA: Diagnosis not present

## 2021-07-10 NOTE — Progress Notes (Signed)
TeleHealth Visit:  This visit was completed with telemedicine (audio/video) technology. Katelyn Walsh has verbally consented to this TeleHealth visit. The patient is located at home, the provider is located at home. The participants in this visit include the listed provider and patient. The visit was conducted today via MyChart video.  OBESITY Katelyn Walsh is here to discuss her progress with her obesity treatment plan along with follow-up of her obesity related diagnoses.   Today's visit was # 40 Starting weight: 224 lbs Starting date: 05/11/2019 Weight at last in office visit: 207 lbs on 06/23/21 Total weight loss: 17 lbs at last in office visit on 06/23/21. Today's reported weight:  No weight reported.  Nutrition Plan: the Category 2 Plan 40% of time. Hunger is well controlled. Cravings are moderately controlled.  Current exercise: walking on trip.  Interim History: Katelyn Walsh got back 4 days ago from a trip to [place] to a casino.  She likes to go to casinos for her vacations.  She reports that good food choices were not available so she did not do well on plan. She says her biggest challenges with sticking the plan are her vacations and family gatherings. Denies intake of caloric beverages. She had labs in February at her PCP which she will bring to her next visit. She is going to [place] this weekend and will go to a cookout and [employer].   Assessment/Plan:  1. Eating disorder/emotional eating Katelyn Walsh has had issues with stress/emotional eating.  However this has been fairly well controlled recently other than some cravings at night.  She can usually avoid eating by having a hot beverage. Currently this is moderately controlled. Overall mood is stable. Denies suicidal/homicidal ideation. Medication(s): bupropion 150 mg daily at lunch.   Plan: Refill bupropion 150 mg daily at lunch.   2. Prediabetes Katelyn Walsh has a diagnosis of prediabetes based on her elevated HgA1c. She  reports A1c at PCP in February was "good" She denies polyphagia. Medication(s): Ozempic 0.5 mg weekly. Lab Results  Component Value Date   HGBA1C 5.6 11/27/2020   Lab Results  Component Value Date   INSULIN 9.7 01/30/2020   INSULIN 10.6 10/02/2019   INSULIN 20.3 05/11/2019    Plan: Continue Ozempic at current dose.   3. Obesity: Current BMI 36.03 Katelyn Walsh is currently in the action stage of change. As such, her goal is to continue with weight loss efforts.  She has agreed to the Category 2 Plan.   Exercise goals: Urged her to walk a few days per week.  Behavioral modification strategies: increasing lean protein intake, decreasing simple carbohydrates, and travel eating strategies.  Bring lab results to next appointment.  Katelyn Walsh has agreed to follow-up with our clinic in 3 weeks.   No orders of the defined types were placed in this encounter.   Medications Discontinued During This Encounter  Medication Reason   buPROPion (WELLBUTRIN SR) 150 MG 12 hr tablet Reorder     Meds ordered this encounter  Medications   buPROPion (WELLBUTRIN SR) 150 MG 12 hr tablet    Sig: Take 1 tablet (150 mg total) by mouth daily. (After lunch)    Dispense:  30 tablet    Refill:  0    Ov for rf    Order Specific Question:   Supervising Provider    Answer:   Dennard Nip D [[id number]]      Objective:   VITALS: Per patient if applicable, see vitals. GENERAL: Alert and in no acute distress. CARDIOPULMONARY: No increased  WOB. Speaking in clear sentences.  PSYCH: Pleasant and cooperative. Speech normal rate and rhythm. Affect is appropriate. Insight and judgement are appropriate. Attention is focused, linear, and appropriate.  NEURO: Oriented as arrived to appointment on time with no prompting.   Lab Results  Component Value Date   CREATININE 1.01 (H) 01/30/2020   BUN 17 01/30/2020   NA 143 01/30/2020   K 4.3 01/30/2020   CL 104 01/30/2020   CO2 23 01/30/2020   Lab Results  Component  Value Date   ALT 18 01/30/2020   AST 18 01/30/2020   ALKPHOS 91 01/30/2020   BILITOT 0.7 01/30/2020   Lab Results  Component Value Date   HGBA1C 5.6 11/27/2020   HGBA1C 6.2 (H) 01/30/2020   HGBA1C 6.0 (H) 10/02/2019   Lab Results  Component Value Date   INSULIN 9.7 01/30/2020   INSULIN 10.6 10/02/2019   INSULIN 20.3 05/11/2019   Lab Results  Component Value Date   TSH 0.997 10/02/2019   Lab Results  Component Value Date   CHOL 101 03/05/2020   HDL 44 03/05/2020   LDLCALC 36 03/05/2020   TRIG 105 03/05/2020   Lab Results  Component Value Date   WBC 6.0 04/26/2019   HGB 12.6 04/26/2019   HCT 40.5 04/26/2019   MCV 96.2 04/26/2019   PLT 171 04/26/2019   No results found for: IRON, TIBC, FERRITIN Lab Results  Component Value Date   VD25OH 51.3 10/02/2019   VD25OH 27 (L) 01/09/2014   VD25OH 34 01/05/2012    Attestation Statements:   Reviewed by clinician on day of visit: allergies, medications, problem list, medical history, surgical history, family history, social history, and previous encounter notes.

## 2021-07-14 ENCOUNTER — Telehealth (INDEPENDENT_AMBULATORY_CARE_PROVIDER_SITE_OTHER): Payer: Medicare Other | Admitting: Family Medicine

## 2021-07-14 ENCOUNTER — Encounter (INDEPENDENT_AMBULATORY_CARE_PROVIDER_SITE_OTHER): Payer: Self-pay | Admitting: Family Medicine

## 2021-07-14 DIAGNOSIS — Z6836 Body mass index (BMI) 36.0-36.9, adult: Secondary | ICD-10-CM | POA: Diagnosis not present

## 2021-07-14 DIAGNOSIS — E669 Obesity, unspecified: Secondary | ICD-10-CM | POA: Diagnosis not present

## 2021-07-14 DIAGNOSIS — R7303 Prediabetes: Secondary | ICD-10-CM | POA: Diagnosis not present

## 2021-07-14 DIAGNOSIS — F3289 Other specified depressive episodes: Secondary | ICD-10-CM | POA: Diagnosis not present

## 2021-07-14 MED ORDER — BUPROPION HCL ER (SR) 150 MG PO TB12
150.0000 mg | ORAL_TABLET | Freq: Every day | ORAL | 0 refills | Status: DC
Start: 2021-07-14 — End: 2021-09-23

## 2021-08-04 ENCOUNTER — Ambulatory Visit (INDEPENDENT_AMBULATORY_CARE_PROVIDER_SITE_OTHER): Payer: Federal, State, Local not specified - PPO | Admitting: Family Medicine

## 2021-08-12 DIAGNOSIS — H10413 Chronic giant papillary conjunctivitis, bilateral: Secondary | ICD-10-CM | POA: Diagnosis not present

## 2021-08-12 DIAGNOSIS — H2513 Age-related nuclear cataract, bilateral: Secondary | ICD-10-CM | POA: Diagnosis not present

## 2021-08-20 NOTE — Progress Notes (Deleted)
TeleHealth Visit:  This visit was completed with telemedicine (audio/video) technology. Katelyn Walsh has verbally consented to this TeleHealth visit. The patient is located at home, the provider is located at home. The participants in this visit include the listed provider and patient. The visit was conducted today via MyChart video.  OBESITY Katelyn Walsh is here to discuss her progress with her obesity treatment plan along with follow-up of her obesity related diagnoses.     Today's visit was # 49 Starting weight: 224 lbs Starting date: 05/11/2019 Weight at last in office visit: 207 lbs on 06/23/21 Total weight loss: 17 lbs at last in office visit on 06/23/21. Today's reported weight:   *** lbs No weight reported.    Nutrition Plan: the Category 2 Plan.  Hunger is {EWCONTROLASSESSMENT:24261}. Cravings are {EWCONTROLASSESSMENT:24261}.  Current exercise: {exercise types:16438}  Interim History: ***  Assessment/Plan:  1. ***  2. ***  3. ***  Obesity: Current BMI *** Katelyn Walsh {CHL AMB IS/IS NOT:210130109} currently in the action stage of change. As such, her goal is to {MWMwtloss#1:210800005}.  She has agreed to {MWMwtlossportion/plan2:23431}.   Exercise goals: {MWM EXERCISE RECS:23473}  Behavioral modification strategies: {MWMwtlossdietstrategies3:23432}.  Katelyn Walsh has agreed to follow-up with our clinic in {NUMBER 1-10:22536} weeks.   No orders of the defined types were placed in this encounter.   There are no discontinued medications.   No orders of the defined types were placed in this encounter.     Objective:   VITALS: Per patient if applicable, see vitals. GENERAL: Alert and in no acute distress. CARDIOPULMONARY: No increased WOB. Speaking in clear sentences.  PSYCH: Pleasant and cooperative. Speech normal rate and rhythm. Affect is appropriate. Insight and judgement are appropriate. Attention is focused, linear, and appropriate.  NEURO: Oriented as arrived to appointment  on time with no prompting.   Lab Results  Component Value Date   CREATININE 1.01 (H) 01/30/2020   BUN 17 01/30/2020   NA 143 01/30/2020   K 4.3 01/30/2020   CL 104 01/30/2020   CO2 23 01/30/2020   Lab Results  Component Value Date   ALT 18 01/30/2020   AST 18 01/30/2020   ALKPHOS 91 01/30/2020   BILITOT 0.7 01/30/2020   Lab Results  Component Value Date   HGBA1C 5.6 11/27/2020   HGBA1C 6.2 (H) 01/30/2020   HGBA1C 6.0 (H) 10/02/2019   Lab Results  Component Value Date   INSULIN 9.7 01/30/2020   INSULIN 10.6 10/02/2019   INSULIN 20.3 05/11/2019   Lab Results  Component Value Date   TSH 0.997 10/02/2019   Lab Results  Component Value Date   CHOL 101 03/05/2020   HDL 44 03/05/2020   LDLCALC 36 03/05/2020   TRIG 105 03/05/2020   Lab Results  Component Value Date   WBC 6.0 04/26/2019   HGB 12.6 04/26/2019   HCT 40.5 04/26/2019   MCV 96.2 04/26/2019   PLT 171 04/26/2019   No results found for: "IRON", "TIBC", "FERRITIN" Lab Results  Component Value Date   VD25OH 51.3 10/02/2019   VD25OH 27 (L) 01/09/2014   VD25OH 34 01/05/2012    Attestation Statements:   Reviewed by clinician on day of visit: allergies, medications, problem list, medical history, surgical history, family history, social history, and previous encounter notes.  ***(delete if time-based billing not used) Time spent on visit including the items listed below was *** minutes.  -preparing to see the patient (e.g., review of tests, history, previous notes) -obtaining and/or reviewing separately obtained history -counseling and  educating the patient/family/caregiver -documenting clinical information in the electronic or other health record -ordering medications, tests, or procedures -independently interpreting results and communicating results to the patient/ family/caregiver -referring and communicating with other health care professionals  -care coordination

## 2021-08-25 ENCOUNTER — Telehealth (INDEPENDENT_AMBULATORY_CARE_PROVIDER_SITE_OTHER): Payer: Federal, State, Local not specified - PPO | Admitting: Family Medicine

## 2021-08-27 ENCOUNTER — Ambulatory Visit (INDEPENDENT_AMBULATORY_CARE_PROVIDER_SITE_OTHER): Payer: Medicare Other | Admitting: Family Medicine

## 2021-09-08 ENCOUNTER — Telehealth (INDEPENDENT_AMBULATORY_CARE_PROVIDER_SITE_OTHER): Payer: Medicare Other | Admitting: Family Medicine

## 2021-09-15 ENCOUNTER — Ambulatory Visit (INDEPENDENT_AMBULATORY_CARE_PROVIDER_SITE_OTHER): Payer: Medicare Other | Admitting: Family Medicine

## 2021-09-23 ENCOUNTER — Encounter (INDEPENDENT_AMBULATORY_CARE_PROVIDER_SITE_OTHER): Payer: Self-pay | Admitting: Adult Health

## 2021-09-23 ENCOUNTER — Ambulatory Visit (INDEPENDENT_AMBULATORY_CARE_PROVIDER_SITE_OTHER): Payer: Medicare Other | Admitting: Family Medicine

## 2021-09-23 ENCOUNTER — Ambulatory Visit (INDEPENDENT_AMBULATORY_CARE_PROVIDER_SITE_OTHER): Payer: Medicare Other | Admitting: Adult Health

## 2021-09-23 VITALS — BP 121/82 | HR 68 | Temp 98.0°F | Ht 64.0 in | Wt 210.0 lb

## 2021-09-23 DIAGNOSIS — E669 Obesity, unspecified: Secondary | ICD-10-CM | POA: Diagnosis not present

## 2021-09-23 DIAGNOSIS — R7303 Prediabetes: Secondary | ICD-10-CM | POA: Diagnosis not present

## 2021-09-23 DIAGNOSIS — Z7985 Long-term (current) use of injectable non-insulin antidiabetic drugs: Secondary | ICD-10-CM

## 2021-09-23 DIAGNOSIS — F3289 Other specified depressive episodes: Secondary | ICD-10-CM | POA: Diagnosis not present

## 2021-09-23 DIAGNOSIS — Z6836 Body mass index (BMI) 36.0-36.9, adult: Secondary | ICD-10-CM | POA: Diagnosis not present

## 2021-09-23 MED ORDER — BUPROPION HCL ER (SR) 150 MG PO TB12: 150.0000 mg | ORAL_TABLET | Freq: Every day | ORAL | 0 refills | Status: DC

## 2021-09-30 NOTE — Progress Notes (Unsigned)
Chief Complaint:   OBESITY Alek is here to discuss her progress with her obesity treatment plan along with follow-up of her obesity related diagnoses. Beuna is on the Category 2 Plan and states she is following her eating plan approximately 10% of the time. Shaana states she is walking 1 mile 7 times per week.  Today's visit was #: 39 Starting weight: 224 lbs Starting date: 05/11/2019 Today's weight: 210 lbs Today's date: 09/23/2021 Total lbs lost to date: 14 lbs Total lbs lost since last in-office visit: 0  Interim History: Mariyam started Ozempic 0.25 mg 02/27/21--she increased Ozempic from 0.25 mg to 0.5 mg on 06/02/21. Bioimpedance results reviewed with patient today. Muscle mass + 1.6 lbs. Adipose mass - 2 lbs.  Subjective:   1. Pre-diabetes ***  2. Other depression, with emotional eating tendencies Rosey's blood pressure and heart rate are excellent at office visit today. She reports stable mood. Denies suicidal ideas, and homicidal ideas. She is currently taking Bupropion SR 150 mg at lunch.  Assessment/Plan:   1. Pre-diabetes Murna will continue Ozempic 0.5 mg once weekly, consider increase in dose *** ***.  2. Other depression, with emotional eating tendencies We will refill Bupropion SR 150 mg daily for 1 month with 0 refills.  -Refill buPROPion (WELLBUTRIN SR) 150 MG 12 hr tablet; Take 1 tablet (150 mg total) by mouth daily. (After lunch)  Dispense: 30 tablet; Refill: 0  3. Obesity, Current BMI 36.1 Isabel is currently in the action stage of change. As such, her goal is to continue with weight loss efforts. She has agreed to the Category 2 Plan.   Exercise goals: As is.  Behavioral modification strategies: increasing lean protein intake, decreasing simple carbohydrates, meal planning and cooking strategies, keeping healthy foods in the home, and planning for success.  Akia has agreed to follow-up with our clinic in 4 weeks. She was informed of the importance of  frequent follow-up visits to maximize her success with intensive lifestyle modifications for her multiple health conditions.   Objective:   Blood pressure 121/82, pulse 68, temperature 98 F (36.7 C), height '5\' 4"'$  (1.626 m), weight 210 lb (95.3 kg), SpO2 97 %. Body mass index is 36.05 kg/m.  General: Cooperative, alert, well developed, in no acute distress. HEENT: Conjunctivae and lids unremarkable. Cardiovascular: Regular rhythm.  Lungs: Normal work of breathing. Neurologic: No focal deficits.   Lab Results  Component Value Date   CREATININE 1.01 (H) 01/30/2020   BUN 17 01/30/2020   NA 143 01/30/2020   K 4.3 01/30/2020   CL 104 01/30/2020   CO2 23 01/30/2020   Lab Results  Component Value Date   ALT 18 01/30/2020   AST 18 01/30/2020   ALKPHOS 91 01/30/2020   BILITOT 0.7 01/30/2020   Lab Results  Component Value Date   HGBA1C 5.6 11/27/2020   HGBA1C 6.2 (H) 01/30/2020   HGBA1C 6.0 (H) 10/02/2019   Lab Results  Component Value Date   INSULIN 9.7 01/30/2020   INSULIN 10.6 10/02/2019   INSULIN 20.3 05/11/2019   Lab Results  Component Value Date   TSH 0.997 10/02/2019   Lab Results  Component Value Date   CHOL 101 03/05/2020   HDL 44 03/05/2020   LDLCALC 36 03/05/2020   TRIG 105 03/05/2020   Lab Results  Component Value Date   VD25OH 51.3 10/02/2019   VD25OH 27 (L) 01/09/2014   VD25OH 34 01/05/2012   Lab Results  Component Value Date   WBC  6.0 04/26/2019   HGB 12.6 04/26/2019   HCT 40.5 04/26/2019   MCV 96.2 04/26/2019   PLT 171 04/26/2019   No results found for: "IRON", "TIBC", "FERRITIN"  Obesity Behavioral Intervention:   Approximately 15 minutes were spent on the discussion below.  ASK: We discussed the diagnosis of obesity with [name] today and Mikaiya agreed to give [place] permission to discuss obesity behavioral modification therapy today.  ASSESS: Sarin has the diagnosis of obesity and her BMI today is 36.1. Amylynn is in the action stage of  change.   ADVISE: Iasha was educated on the multiple health risks of obesity as well as the benefit of weight loss to improve her health. She was advised of the need for long term treatment and the importance of lifestyle modifications to improve her current health and to decrease her risk of future health problems.  AGREE: Multiple dietary modification options and treatment options were discussed and Gissela agreed to follow the recommendations documented in the above note.  ARRANGE: Etienne was educated on the importance of frequent visits to treat obesity as outlined per CMS and USPSTF guidelines and agreed to schedule her next follow up appointment today.  Attestation Statements:   Reviewed by clinician on day of visit: allergies, medications, problem list, medical history, surgical history, family history, social history, and previous encounter notes.  I, Keyera Hattabaugh, RMA, am acting as transcriptionist for Mina Marble, NP.  I have reviewed the above documentation for accuracy and completeness, and I agree with the above. -  ***

## 2021-10-01 ENCOUNTER — Encounter (INDEPENDENT_AMBULATORY_CARE_PROVIDER_SITE_OTHER): Payer: Self-pay

## 2021-10-28 ENCOUNTER — Encounter (INDEPENDENT_AMBULATORY_CARE_PROVIDER_SITE_OTHER): Payer: Self-pay | Admitting: Adult Health

## 2021-10-28 ENCOUNTER — Telehealth (INDEPENDENT_AMBULATORY_CARE_PROVIDER_SITE_OTHER): Payer: Medicare Other | Admitting: Adult Health

## 2021-10-28 DIAGNOSIS — F3289 Other specified depressive episodes: Secondary | ICD-10-CM

## 2021-10-28 DIAGNOSIS — Z6836 Body mass index (BMI) 36.0-36.9, adult: Secondary | ICD-10-CM | POA: Diagnosis not present

## 2021-10-28 DIAGNOSIS — R7303 Prediabetes: Secondary | ICD-10-CM

## 2021-10-28 DIAGNOSIS — E669 Obesity, unspecified: Secondary | ICD-10-CM | POA: Diagnosis not present

## 2021-10-28 MED ORDER — OZEMPIC (0.25 OR 0.5 MG/DOSE) 2 MG/1.5ML ~~LOC~~ SOPN
0.5000 mg | PEN_INJECTOR | SUBCUTANEOUS | 0 refills | Status: DC
Start: 2021-10-28 — End: 2022-01-26

## 2021-10-28 MED ORDER — BUPROPION HCL ER (SR) 150 MG PO TB12: 150.0000 mg | ORAL_TABLET | Freq: Every day | ORAL | 0 refills | Status: DC

## 2021-10-30 ENCOUNTER — Ambulatory Visit: Payer: Medicare Other | Admitting: Hematology and Oncology

## 2021-11-01 ENCOUNTER — Encounter (INDEPENDENT_AMBULATORY_CARE_PROVIDER_SITE_OTHER): Payer: Self-pay | Admitting: Adult Health

## 2021-11-01 DIAGNOSIS — F32A Depression, unspecified: Secondary | ICD-10-CM | POA: Insufficient documentation

## 2021-11-01 NOTE — Progress Notes (Unsigned)
TeleHealth Visit:  Due to the COVID-19 pandemic, this visit was completed with telemedicine (audio/video) technology to reduce patient and provider exposure as well as to preserve personal protective equipment.   Katelyn Walsh has verbally consented to this TeleHealth visit. The patient is located at home, the provider is located at the [facility] office. The participants in this visit include the listed provider and patient.   The visit was conducted today via video visit.   Chief Complaint: OBESITY Katelyn Walsh is here to discuss her progress with her obesity treatment plan along with follow-up of her obesity related diagnoses. Katelyn Walsh is on the Category 2 Plan and states she is following her eating plan approximately 30% of the time. Katelyn Walsh states she is waling 20 minutes 2 times per week.  Today's visit was #: 56 Starting weight: 210 lbs Starting date: 05/11/2019  Interim History: converted from office visit to my chart video visit due to nausea and vomiting that acutely started while driving into appointment this morning.  She denies fever or abdominal pain.  She consumed pork chop and okra last night.  Unsure of how long pork chop was in her freezer.   Subjective:   1. Pre-diabetes *** Requests 90 day supply due to cost.   2. Other depression, with emotional eating tendencies *** Requests 90 day supply due to cost.    Assessment/Plan:   1. Pre-diabetes Refill - Semaglutide,0.25 or 0.'5MG'$ /DOS, (OZEMPIC, 0.25 OR 0.5 MG/DOSE,) 2 MG/1.5ML SOPN; Inject 0.5 mg into the skin once a week.  Dispense: 4.5 mL; Refill: 0  2. Other depression, with emotional eating tendencies Refill - buPROPion (WELLBUTRIN SR) 150 MG 12 hr tablet; Take 1 tablet (150 mg total) by mouth daily. (After lunch)  Dispense: 90 tablet; Refill: 0  3. Obesity, Current BMI 36.1 Katelyn Walsh is currently in the action stage of change. As such, her goal is to continue with weight loss efforts. She has agreed to the Category  2 Plan.   Exercise goals:  as is.   Behavioral modification strategies: increasing lean protein intake, decreasing simple carbohydrates, meal planning and cooking strategies, keeping healthy foods in the home, and planning for success.  Katelyn Walsh has agreed to follow-up with our clinic in 4 weeks. She was informed of the importance of frequent follow-up visits to maximize her success with intensive lifestyle modifications for her multiple health conditions.  Objective:   VITALS: Per patient if applicable, see vitals. GENERAL: Alert and in no acute distress. CARDIOPULMONARY: No increased WOB. Speaking in clear sentences.  PSYCH: Pleasant and cooperative. Speech normal rate and rhythm. Affect is appropriate. Insight and judgement are appropriate. Attention is focused, linear, and appropriate.  NEURO: Oriented as arrived to appointment on time with no prompting.   Lab Results  Component Value Date   CREATININE 1.01 (H) 01/30/2020   BUN 17 01/30/2020   NA 143 01/30/2020   K 4.3 01/30/2020   CL 104 01/30/2020   CO2 23 01/30/2020   Lab Results  Component Value Date   ALT 18 01/30/2020   AST 18 01/30/2020   ALKPHOS 91 01/30/2020   BILITOT 0.7 01/30/2020   Lab Results  Component Value Date   HGBA1C 5.6 11/27/2020   HGBA1C 6.2 (H) 01/30/2020   HGBA1C 6.0 (H) 10/02/2019   Lab Results  Component Value Date   INSULIN 9.7 01/30/2020   INSULIN 10.6 10/02/2019   INSULIN 20.3 05/11/2019   Lab Results  Component Value Date   TSH 0.997 10/02/2019  Lab Results  Component Value Date   CHOL 101 03/05/2020   HDL 44 03/05/2020   LDLCALC 36 03/05/2020   TRIG 105 03/05/2020   Lab Results  Component Value Date   VD25OH 51.3 10/02/2019   VD25OH 27 (L) 01/09/2014   VD25OH 34 01/05/2012   Lab Results  Component Value Date   WBC 6.0 04/26/2019   HGB 12.6 04/26/2019   HCT 40.5 04/26/2019   MCV 96.2 04/26/2019   PLT 171 04/26/2019   No results found for: "IRON", "TIBC",  "FERRITIN"  Attestation Statements:   Reviewed by clinician on day of visit: allergies, medications, problem list, medical history, surgical history, family history, social history, and previous encounter notes.  Time spent on visit including pre-visit chart review and post-visit charting and care was 24 minutes.   I, Davy Pique, RMA, am acting as Location manager for Mina Marble, NP.  I have reviewed the above documentation for accuracy and completeness, and I agree with the above. - ***

## 2021-11-06 ENCOUNTER — Inpatient Hospital Stay: Payer: Federal, State, Local not specified - PPO | Admitting: Hematology and Oncology

## 2021-11-06 NOTE — Assessment & Plan Note (Deleted)
Left breast invasive ductal carcinoma, 1.8 cm, T1 C. N0 M0 stage IA ER percent, PR negative, HER-2 negative, grade 2, Ki-67 90%, 3 SLN negative, Oncotype DX recurrence score 18, percent risk of recurrence, status post radiation therapy, Arimidex 1 mg daily was started August 2013 later switched to exemestane then switched to Femara and then switch to tamoxifen and again switched back to Anastrozole Nov 2015.Completed August 2020  Anastrozole toxicities: 1. Itching of the skin:Improved.  3. Weight gain issues:She is working with healthy weight loss clinic and has lost 30 pounds.  Because of her multiple travels to [place], she has gained some weight.  Patient loves going to [facility].  Breast Cancer Surveillance: 1. Breast exam9/14/2023: Normal 2. Mammogram 1/4/2023no evidence of malignancy breast density category B 12/15/2016: MRI pelvis: Perianal fistulous  Lower extremity edema due to diastolic dysfunction on diuretics.   Return to clinic in1 yearfor follow-upfor surveillance.

## 2021-11-10 DIAGNOSIS — E1169 Type 2 diabetes mellitus with other specified complication: Secondary | ICD-10-CM | POA: Diagnosis not present

## 2021-11-10 DIAGNOSIS — F419 Anxiety disorder, unspecified: Secondary | ICD-10-CM | POA: Diagnosis not present

## 2021-11-10 DIAGNOSIS — N1832 Chronic kidney disease, stage 3b: Secondary | ICD-10-CM | POA: Diagnosis not present

## 2021-11-10 DIAGNOSIS — Z23 Encounter for immunization: Secondary | ICD-10-CM | POA: Diagnosis not present

## 2021-11-10 DIAGNOSIS — F330 Major depressive disorder, recurrent, mild: Secondary | ICD-10-CM | POA: Diagnosis not present

## 2021-11-10 DIAGNOSIS — Z853 Personal history of malignant neoplasm of breast: Secondary | ICD-10-CM | POA: Diagnosis not present

## 2021-11-10 DIAGNOSIS — I10 Essential (primary) hypertension: Secondary | ICD-10-CM | POA: Diagnosis not present

## 2021-11-10 DIAGNOSIS — E559 Vitamin D deficiency, unspecified: Secondary | ICD-10-CM | POA: Diagnosis not present

## 2021-11-10 DIAGNOSIS — E538 Deficiency of other specified B group vitamins: Secondary | ICD-10-CM | POA: Diagnosis not present

## 2021-11-10 DIAGNOSIS — G2581 Restless legs syndrome: Secondary | ICD-10-CM | POA: Diagnosis not present

## 2021-11-10 DIAGNOSIS — E785 Hyperlipidemia, unspecified: Secondary | ICD-10-CM | POA: Diagnosis not present

## 2021-11-10 LAB — BASIC METABOLIC PANEL
BUN: 21 (ref 4–21)
CO2: 20 (ref 13–22)
Chloride: 113 — AB (ref 99–108)
Creatinine: 1 (ref 0.5–1.1)
Glucose: 109
Potassium: 4.2 mEq/L (ref 3.5–5.1)
Sodium: 143 (ref 137–147)

## 2021-11-10 LAB — HEPATIC FUNCTION PANEL
ALT: 13 U/L (ref 7–35)
AST: 14 (ref 13–35)
Alkaline Phosphatase: 89 (ref 25–125)
Bilirubin, Total: 0.7

## 2021-11-10 LAB — COMPREHENSIVE METABOLIC PANEL
Albumin: 4.3 (ref 3.5–5.0)
Calcium: 8.5 — AB (ref 8.7–10.7)

## 2021-11-10 LAB — HEMOGLOBIN A1C: Hemoglobin A1C: 5.7

## 2021-11-23 NOTE — Progress Notes (Signed)
Patient Care Team: Velna Hatchet, MD as PCP - General (Internal Medicine) Jerline Pain, MD as PCP - Cardiology (Cardiology) Lavone Orn, MD as Attending Physician (Internal Medicine) Harlin Heys, MD as Referring Physician (Family Medicine)  DIAGNOSIS: No diagnosis found.  SUMMARY OF ONCOLOGIC HISTORY: Oncology History  Primary cancer of upper inner quadrant of left female breast ([place])  07/31/2011 Surgery   left breast lumpectomy: IDC grade to 1.8 cm, no LV are, with DCIS, 3 sentinel nodes negative, grade 2, ER 100%, PR 0%, HER-2 negative, Ki-67 9%, T1 cN0 M0 stage IA, Oncotype score 18, 11% ROR low risk   10/14/2011 - 11/17/2011 Radiation Therapy   Adjuvant Radiation therapy   10/14/2011 - 10/03/2018 Anti-estrogen oral therapy   Arimidex 1 mg daily developed a change switched to Femara and exemestane and then took tamoxifen and now back to Arimidex from 01/09/2014       CHIEF COMPLIANT: Follow-up of left breast cancer surveillance  INTERVAL HISTORY: Katelyn Walsh is a 71 y.o. with above-mentioned history of  left breast cancer currently on surveillance. She presents to the clinic for a follow-up.    ALLERGIES:  is allergic to dust mite extract and pollen extract.  MEDICATIONS:  Current Outpatient Medications  Medication Sig Dispense Refill   ALPRAZolam (XANAX) 0.5 MG tablet Take 0.5 mg by mouth at bedtime as needed for anxiety.     Ascorbic Acid (VITAMIN C) 500 MG CAPS Take 1 capsule by mouth daily.     aspirin EC 81 MG tablet Take 81 mg by mouth daily.     buPROPion (WELLBUTRIN SR) 150 MG 12 hr tablet Take 1 tablet (150 mg total) by mouth daily. (After lunch) 90 tablet 0   cholecalciferol 5000 units TABS Take 1 tablet (5,000 Units total) by mouth daily.     citalopram (CELEXA) 20 MG tablet Take 20 mg by mouth daily.     furosemide (LASIX) 40 MG tablet Take 40 mg by mouth. PRN     hydrOXYzine (ATARAX/VISTARIL) 25 MG tablet Take 1 tablet (25 mg total) by mouth 3  (three) times daily as needed. 30 tablet 6   levocetirizine (XYZAL) 5 MG tablet Take 1 tablet (5 mg total) by mouth every evening.     losartan (COZAAR) 100 MG tablet Take 100 mg by mouth every morning.      montelukast (SINGULAIR) 10 MG tablet Take 10 mg by mouth at bedtime.     Probiotic Product (ALIGN) 4 MG CAPS Take 1 capsule by mouth daily with breakfast.      rOPINIRole (REQUIP) 1 MG tablet Take 1-2 mg by mouth at bedtime.      rosuvastatin (CRESTOR) 20 MG tablet Take 20 mg by mouth at bedtime.     Semaglutide,0.25 or 0.5MG/DOS, (OZEMPIC, 0.25 OR 0.5 MG/DOSE,) 2 MG/1.5ML SOPN Inject 0.5 mg into the skin once a week. 4.5 mL 0   topiramate (TOPAMAX) 100 MG tablet Take 100 mg by mouth 2 (two) times daily.     zinc gluconate 50 MG tablet Take 50 mg by mouth daily.     No current facility-administered medications for this visit.    PHYSICAL EXAMINATION: ECOG PERFORMANCE STATUS: {CHL ONC ECOG PS:[id number]}  There were no vitals filed for this visit. There were no vitals filed for this visit.  BREAST:*** No palpable masses or nodules in either right or left breasts. No palpable axillary supraclavicular or infraclavicular adenopathy no breast tenderness or nipple discharge. (exam performed in the presence of a  chaperone)  LABORATORY DATA:  I have reviewed the data as listed    Latest Ref Rng & Units 01/30/2020    2:00 PM 10/02/2019   12:58 PM 05/11/2019   11:54 AM  CMP  Glucose 65 - 99 mg/dL 118  116  140   BUN 8 - 27 mg/dL '17  19  15   ' Creatinine 0.57 - 1.00 mg/dL 1.01  0.87  0.97   Sodium 134 - 144 mmol/L 143  140  144   Potassium 3.5 - 5.2 mmol/L 4.3  4.4  4.4   Chloride 96 - 106 mmol/L 104  105  109   CO2 20 - 29 mmol/L '23  21  19   ' Calcium 8.7 - 10.3 mg/dL 9.7  9.0  9.3   Total Protein 6.0 - 8.5 g/dL 7.6  7.7  7.2   Total Bilirubin 0.0 - 1.2 mg/dL 0.7  0.5  0.6   Alkaline Phos 44 - 121 IU/L 91  92  87   AST 0 - 40 IU/L '18  19  20   ' ALT 0 - 32 IU/L '18  19  19     ' Lab  Results  Component Value Date   WBC 6.0 04/26/2019   HGB 12.6 04/26/2019   HCT 40.5 04/26/2019   MCV 96.2 04/26/2019   PLT 171 04/26/2019   NEUTROABS 3.1 07/31/2014    ASSESSMENT & PLAN:  No problem-specific Assessment & Plan notes found for this encounter.    No orders of the defined types were placed in this encounter.  The patient has a good understanding of the overall plan. she agrees with it. she will call with any problems that may develop before the next visit here. Total time spent: 30 mins including face to face time and time spent for planning, charting and co-ordination of care   Suzzette Righter, [place] 11/23/21    I Gardiner Coins am scribing for Dr. Lindi Adie  ***

## 2021-11-25 ENCOUNTER — Other Ambulatory Visit: Payer: Self-pay

## 2021-11-25 ENCOUNTER — Encounter (INDEPENDENT_AMBULATORY_CARE_PROVIDER_SITE_OTHER): Payer: Self-pay | Admitting: Adult Health

## 2021-11-25 ENCOUNTER — Ambulatory Visit (INDEPENDENT_AMBULATORY_CARE_PROVIDER_SITE_OTHER): Payer: Medicare Other | Admitting: Adult Health

## 2021-11-25 ENCOUNTER — Inpatient Hospital Stay: Payer: Medicare Other | Attending: Hematology and Oncology | Admitting: Hematology and Oncology

## 2021-11-25 VITALS — BP 107/75 | HR 77 | Temp 98.4°F | Ht 64.0 in | Wt 206.0 lb

## 2021-11-25 DIAGNOSIS — Z79899 Other long term (current) drug therapy: Secondary | ICD-10-CM | POA: Diagnosis not present

## 2021-11-25 DIAGNOSIS — C50212 Malignant neoplasm of upper-inner quadrant of left female breast: Secondary | ICD-10-CM

## 2021-11-25 DIAGNOSIS — E66811 Obesity, class 1: Secondary | ICD-10-CM

## 2021-11-25 DIAGNOSIS — E669 Obesity, unspecified: Secondary | ICD-10-CM | POA: Diagnosis not present

## 2021-11-25 DIAGNOSIS — Z853 Personal history of malignant neoplasm of breast: Secondary | ICD-10-CM | POA: Diagnosis not present

## 2021-11-25 DIAGNOSIS — R944 Abnormal results of kidney function studies: Secondary | ICD-10-CM

## 2021-11-25 DIAGNOSIS — R7303 Prediabetes: Secondary | ICD-10-CM | POA: Diagnosis not present

## 2021-11-25 DIAGNOSIS — Z6835 Body mass index (BMI) 35.0-35.9, adult: Secondary | ICD-10-CM

## 2021-11-25 DIAGNOSIS — Z923 Personal history of irradiation: Secondary | ICD-10-CM | POA: Insufficient documentation

## 2021-11-25 MED ORDER — ALPRAZOLAM 0.5 MG PO TABS: 0.5000 mg | ORAL_TABLET | Freq: Every evening | ORAL | 0 refills | Status: AC | PRN

## 2021-11-25 NOTE — Assessment & Plan Note (Addendum)
Left breast invasive ductal carcinoma, 1.8 cm, T1 C. N0 M0 stage IA ER percent, PR negative, HER-2 negative, grade 2, Ki-67 90%, 3 SLN negative, Oncotype DX recurrence score 18, percent risk of recurrence, status post radiation therapy, Arimidex 1 mg daily was started August 2013 later switched to exemestane then switched to Femara and then switch to tamoxifen and again switched back to Anastrozole Nov 2015.Completed August 2020   1. Itching of the skin:Improved.  3. Weight gain issues:She is working with healthy weight loss clinic and has lost 30 pounds.  Because of her multiple travels to [place], she has gained some weight.  Patient loves going to [facility].  Breast Cancer Surveillance: 1. Breast exam10/04/2021: Normal 2. Mammogram1/4/2023no evidence of malignancy breast density category B 12/15/2016: MRI pelvis: Perianal fistulous  Lower extremity edema due to diastolic dysfunction on diuretics. Bone density December 2015 revealed a T score of -0.9, normal bone density  Return to clinic in1 yearfor follow-upfor surveillance.

## 2021-11-27 ENCOUNTER — Other Ambulatory Visit (INDEPENDENT_AMBULATORY_CARE_PROVIDER_SITE_OTHER): Payer: Self-pay | Admitting: *Deleted

## 2021-12-03 DIAGNOSIS — R944 Abnormal results of kidney function studies: Secondary | ICD-10-CM | POA: Insufficient documentation

## 2021-12-03 NOTE — Progress Notes (Signed)
Chief Complaint:   OBESITY Katelyn Walsh is here to discuss her progress with her obesity treatment plan along with follow-up of her obesity related diagnoses. [name] is on the Category 2 Plan and states she is following her eating plan approximately 45% of the time. [name] states she is not exercising.   Today's visit was #: 23 Starting weight: 210 lbs Starting date: 05/11/2019 Today's weight: 206 lbs Today's date: 11/25/2021 Total lbs lost to date: 4 lbs Total lbs lost since last in-office visit: 4 lbs  Interim History:  Since last office visit she had a chronic follow up with PCP with A1c and CMP.   [name] also experienced an acute URI, nasal drainage, non productive cough, headache. Her sx's cleared with with z-pack.  [name] recently travelled to  [facility] . She attended an [employer] concert. She won at slots- $8K and $1K !!! She ate [employer]- focused on lean protein.  Subjective:   1. Pre-diabetes Discussed labs with patient today. 06/02/2021, Ozempic 0.5 mg once weekly.   PCP ran A1c, 11/19/2021, 5.7.  2. Decreased GFR Worsening.  Discussed labs with patient today. 11/19/2021, CMP-Creatinine 1.0 GFR 54.   She intermittently uses NSAID's (Naprosyn).   She denies family history of renal disease.   Assessment/Plan:   1. Pre-diabetes Continue Ozempic 0.5 mg once weekly.  No need for refill today.   2. Decreased GFR Avoid NSAID's.  Use acetaminophen for pain control.    3. Obesity, Current BMI 35.5 [name] is currently in the action stage of change. As such, her goal is to continue with weight loss efforts. She has agreed to the Category 2 Plan.   Exercise goals: All adults should avoid inactivity. Some physical activity is better than none, and adults who participate in any amount of physical activity gain some health benefits.  Behavioral modification strategies: increasing lean protein intake, decreasing simple carbohydrates, meal planning and cooking strategies,  keeping healthy foods in the home, and planning for success.  [name] has agreed to follow-up with our clinic in 4 weeks. She was informed of the importance of frequent follow-up visits to maximize her success with intensive lifestyle modifications for her multiple health conditions.   Objective:   Blood pressure 107/75, pulse 77, temperature 98.4 F (36.9 C), height '5\' 4"'$  (1.626 m), weight 206 lb (93.4 kg), SpO2 95 %. Body mass index is 35.36 kg/m.  General: Cooperative, alert, well developed, in no acute distress. HEENT: Conjunctivae and lids unremarkable. Cardiovascular: Regular rhythm.  Lungs: Normal work of breathing. Neurologic: No focal deficits.   Lab Results  Component Value Date   CREATININE 1.0 11/10/2021   BUN 21 11/10/2021   NA 143 11/10/2021   K 4.2 11/10/2021   CL 113 (A) 11/10/2021   CO2 20 11/10/2021   Lab Results  Component Value Date   ALT 13 11/10/2021   AST 14 11/10/2021   ALKPHOS 89 11/10/2021   BILITOT 0.7 01/30/2020   Lab Results  Component Value Date   HGBA1C 5.7 11/10/2021   HGBA1C 5.6 11/27/2020   HGBA1C 6.2 (H) 01/30/2020   HGBA1C 6.0 (H) 10/02/2019   Lab Results  Component Value Date   INSULIN 9.7 01/30/2020   INSULIN 10.6 10/02/2019   INSULIN 20.3 05/11/2019   Lab Results  Component Value Date   TSH 0.997 10/02/2019   Lab Results  Component Value Date   CHOL 101 03/05/2020   HDL 44 03/05/2020   LDLCALC 36 03/05/2020   TRIG  105 03/05/2020   Lab Results  Component Value Date   VD25OH 51.3 10/02/2019   VD25OH 27 (L) 01/09/2014   VD25OH 34 01/05/2012   Lab Results  Component Value Date   WBC 6.0 04/26/2019   HGB 12.6 04/26/2019   HCT 40.5 04/26/2019   MCV 96.2 04/26/2019   PLT 171 04/26/2019   No results found for: "IRON", "TIBC", "FERRITIN"  Obesity Behavioral Intervention:   Approximately 15 minutes were spent on the discussion below.  ASK: We discussed the diagnosis of obesity with [name] today and [name] agreed to  give [place] permission to discuss obesity behavioral modification therapy today.  ASSESS: [name] has the diagnosis of obesity and her BMI today is 35.5. [name] is in the action stage of change.   ADVISE: [name] was educated on the multiple health risks of obesity as well as the benefit of weight loss to improve her health. She was advised of the need for long term treatment and the importance of lifestyle modifications to improve her current health and to decrease her risk of future health problems.  AGREE: Multiple dietary modification options and treatment options were discussed and [name] agreed to follow the recommendations documented in the above note.  ARRANGE: [name] was educated on the importance of frequent visits to treat obesity as outlined per CMS and USPSTF guidelines and agreed to schedule her next follow up appointment today.  Attestation Statements:   Reviewed by clinician on day of visit: allergies, medications, problem list, medical history, surgical history, family history, social history, and previous encounter notes.   Time spent on visit including pre-visit chart review and post-visit care and charting was 27 minutes.   I, Davy Pique, RMA, am acting as Location manager for Mina Marble, NP.  I have reviewed the above documentation for accuracy and completeness, and I agree with the above. -  Halcyon Heck d. Halston Fairclough, NP-C

## 2021-12-08 DIAGNOSIS — H65192 Other acute nonsuppurative otitis media, left ear: Secondary | ICD-10-CM | POA: Diagnosis not present

## 2021-12-08 DIAGNOSIS — J069 Acute upper respiratory infection, unspecified: Secondary | ICD-10-CM | POA: Diagnosis not present

## 2021-12-15 NOTE — Progress Notes (Signed)
TeleHealth Visit:  This visit was completed with telemedicine (audio/video) technology. Katelyn Walsh has verbally consented to this TeleHealth visit. The patient is located at home, the provider is located at home. The participants in this visit include the listed provider and patient. The visit was conducted today via MyChart video.  OBESITY Katelyn Walsh is here to discuss her progress with her obesity treatment plan along with follow-up of her obesity related diagnoses.   Today's visit was # 36 Starting weight: 210 lbs Starting date: 05/11/2019 Weight at last in office visit: 206 lbs on 11/25/21 Total weight loss: 4 lbs at last in office visit on 11/25/21. Today's reported weight: *** lbs No weight reported.  Nutrition Plan: the Category 2 Plan.   Current exercise: {exercise types:16438} none  Interim History: ***  Assessment/Plan:  1. ***  2. ***  3. ***  Obesity: Current BMI *** Katelyn Walsh {CHL AMB IS/IS NOT:210130109} currently in the action stage of change. As such, her goal is to {MWMwtloss#1:210800005}.  She has agreed to {MWMwtlossportion/plan2:23431}.   Exercise goals: {MWM EXERCISE RECS:23473}  Behavioral modification strategies: {MWMwtlossdietstrategies3:23432}.  Katelyn Walsh has agreed to follow-up with our clinic in {NUMBER 1-10:22536} weeks.   No orders of the defined types were placed in this encounter.   There are no discontinued medications.   No orders of the defined types were placed in this encounter.     Objective:   VITALS: Per patient if applicable, see vitals. GENERAL: Alert and in no acute distress. CARDIOPULMONARY: No increased WOB. Speaking in clear sentences.  PSYCH: Pleasant and cooperative. Speech normal rate and rhythm. Affect is appropriate. Insight and judgement are appropriate. Attention is focused, linear, and appropriate.  NEURO: Oriented as arrived to appointment on time with no prompting.   Lab Results  Component Value Date   CREATININE 1.0  11/10/2021   BUN 21 11/10/2021   NA 143 11/10/2021   K 4.2 11/10/2021   CL 113 (A) 11/10/2021   CO2 20 11/10/2021   Lab Results  Component Value Date   ALT 13 11/10/2021   AST 14 11/10/2021   ALKPHOS 89 11/10/2021   BILITOT 0.7 01/30/2020   Lab Results  Component Value Date   HGBA1C 5.7 11/10/2021   HGBA1C 5.6 11/27/2020   HGBA1C 6.2 (H) 01/30/2020   HGBA1C 6.0 (H) 10/02/2019   Lab Results  Component Value Date   INSULIN 9.7 01/30/2020   INSULIN 10.6 10/02/2019   INSULIN 20.3 05/11/2019   Lab Results  Component Value Date   TSH 0.997 10/02/2019   Lab Results  Component Value Date   CHOL 101 03/05/2020   HDL 44 03/05/2020   LDLCALC 36 03/05/2020   TRIG 105 03/05/2020   Lab Results  Component Value Date   WBC 6.0 04/26/2019   HGB 12.6 04/26/2019   HCT 40.5 04/26/2019   MCV 96.2 04/26/2019   PLT 171 04/26/2019   No results found for: "IRON", "TIBC", "FERRITIN" Lab Results  Component Value Date   VD25OH 51.3 10/02/2019   VD25OH 27 (L) 01/09/2014   VD25OH 34 01/05/2012    Attestation Statements:   Reviewed by clinician on day of visit: allergies, medications, problem list, medical history, surgical history, family history, social history, and previous encounter notes.  ***(delete if time-based billing not used) Time spent on visit including the items listed below was *** minutes.  -preparing to see the patient (e.g., review of tests, history, previous notes) -obtaining and/or reviewing separately obtained history -counseling and educating the patient/family/caregiver -documenting clinical information  in the electronic or other health record -ordering medications, tests, or procedures -independently interpreting results and communicating results to the patient/ family/caregiver -referring and communicating with other health care professionals  -care coordination

## 2021-12-17 ENCOUNTER — Telehealth (INDEPENDENT_AMBULATORY_CARE_PROVIDER_SITE_OTHER): Payer: Medicare Other | Admitting: Family Medicine

## 2021-12-17 ENCOUNTER — Encounter (INDEPENDENT_AMBULATORY_CARE_PROVIDER_SITE_OTHER): Payer: Self-pay | Admitting: Family Medicine

## 2021-12-17 DIAGNOSIS — Z6836 Body mass index (BMI) 36.0-36.9, adult: Secondary | ICD-10-CM

## 2021-12-17 DIAGNOSIS — E669 Obesity, unspecified: Secondary | ICD-10-CM | POA: Diagnosis not present

## 2021-12-17 DIAGNOSIS — R7303 Prediabetes: Secondary | ICD-10-CM | POA: Diagnosis not present

## 2021-12-17 DIAGNOSIS — F3289 Other specified depressive episodes: Secondary | ICD-10-CM | POA: Diagnosis not present

## 2021-12-20 DIAGNOSIS — Z23 Encounter for immunization: Secondary | ICD-10-CM | POA: Diagnosis not present

## 2021-12-22 DIAGNOSIS — I129 Hypertensive chronic kidney disease with stage 1 through stage 4 chronic kidney disease, or unspecified chronic kidney disease: Secondary | ICD-10-CM | POA: Diagnosis not present

## 2021-12-22 DIAGNOSIS — Z1152 Encounter for screening for COVID-19: Secondary | ICD-10-CM | POA: Diagnosis not present

## 2021-12-22 DIAGNOSIS — H65192 Other acute nonsuppurative otitis media, left ear: Secondary | ICD-10-CM | POA: Diagnosis not present

## 2021-12-22 DIAGNOSIS — E1169 Type 2 diabetes mellitus with other specified complication: Secondary | ICD-10-CM | POA: Diagnosis not present

## 2021-12-22 DIAGNOSIS — N1832 Chronic kidney disease, stage 3b: Secondary | ICD-10-CM | POA: Diagnosis not present

## 2021-12-22 DIAGNOSIS — E785 Hyperlipidemia, unspecified: Secondary | ICD-10-CM | POA: Diagnosis not present

## 2021-12-22 DIAGNOSIS — U071 COVID-19: Secondary | ICD-10-CM | POA: Diagnosis not present

## 2021-12-22 DIAGNOSIS — J069 Acute upper respiratory infection, unspecified: Secondary | ICD-10-CM | POA: Diagnosis not present

## 2021-12-22 DIAGNOSIS — R0981 Nasal congestion: Secondary | ICD-10-CM | POA: Diagnosis not present

## 2021-12-22 DIAGNOSIS — R051 Acute cough: Secondary | ICD-10-CM | POA: Diagnosis not present

## 2022-01-01 DIAGNOSIS — D2222 Melanocytic nevi of left ear and external auricular canal: Secondary | ICD-10-CM | POA: Diagnosis not present

## 2022-01-01 DIAGNOSIS — D2272 Melanocytic nevi of left lower limb, including hip: Secondary | ICD-10-CM | POA: Diagnosis not present

## 2022-01-01 DIAGNOSIS — L814 Other melanin hyperpigmentation: Secondary | ICD-10-CM | POA: Diagnosis not present

## 2022-01-01 DIAGNOSIS — D1801 Hemangioma of skin and subcutaneous tissue: Secondary | ICD-10-CM | POA: Diagnosis not present

## 2022-01-01 DIAGNOSIS — L720 Epidermal cyst: Secondary | ICD-10-CM | POA: Diagnosis not present

## 2022-01-01 DIAGNOSIS — L570 Actinic keratosis: Secondary | ICD-10-CM | POA: Diagnosis not present

## 2022-01-01 DIAGNOSIS — L438 Other lichen planus: Secondary | ICD-10-CM | POA: Diagnosis not present

## 2022-01-01 DIAGNOSIS — D225 Melanocytic nevi of trunk: Secondary | ICD-10-CM | POA: Diagnosis not present

## 2022-01-01 DIAGNOSIS — L738 Other specified follicular disorders: Secondary | ICD-10-CM | POA: Diagnosis not present

## 2022-01-01 DIAGNOSIS — D2262 Melanocytic nevi of left upper limb, including shoulder: Secondary | ICD-10-CM | POA: Diagnosis not present

## 2022-01-06 NOTE — Progress Notes (Unsigned)
TeleHealth Visit:  This visit was completed with telemedicine (audio/video) technology. Katelyn Walsh has verbally consented to this TeleHealth visit. The patient is located at home, the provider is located at home. The participants in this visit include the listed provider and patient. The visit was conducted today via phone call.  Unsuccessful attempt was made to connect to video. Length of call was 19 minutes.   OBESITY Katelyn Walsh is here to discuss her progress with her obesity treatment plan along with follow-up of her obesity related diagnoses.   Today's visit was # 71 Starting weight: 210 lbs Starting date: 05/11/2019 Weight at last in office visit: 206 lbs on 11/25/21 Total weight loss: 4 lbs at last in office visit on 11/25/21. Today's reported weight: No weight reported.  Nutrition Plan: the Category 2 Plan.   Current exercise:  none  Interim History: Katelyn Walsh has been ill on and off since coming back from her trip to [place] last month.  She has severe allergies and [place].  He also had COVID when she returned from her trip. She has not been able to follow the meal plan much.  Sometimes she skips a meal.  She is trying to make sure she gets in enough protein.  Water intake is poor.  Generally has diet Coke or coffee.  Assessment/Plan:  1. Acute Cough Has had a cough due to her allergies.  Denies fever or myalgias.  Tessalon Perles have helped in the past. She was on allergy injections for about a year but it did not help.  She sees Dr. Shawna Orleans.  Plan: New prescription for Tessalon Perles 200 mg twice daily as needed for cough. Follow-up with PCP for worsening symptoms.  2. Prediabetes Last A1c was 5.7. Medication(s): Ozempic 0.5 mg weekly.  Tolerating well. Lab Results  Component Value Date   HGBA1C 5.7 11/10/2021   Lab Results  Component Value Date   INSULIN 9.7 01/30/2020   INSULIN 10.6 10/02/2019   INSULIN  20.3 05/11/2019    Plan: Continue Ozempic 0.5 mg weekly.   3. Obesity: Current BMI 36.6 Katelyn Walsh is currently in the action stage of change. As such, her goal is to continue with weight loss efforts.  She has agreed to the Category 2 Plan.   She will drink at least 12 ounces of water for every Diet Coke that she drinks.  Exercise goals: No exercise has been prescribed at this time.  Behavioral modification strategies: increasing lean protein intake, increasing water intake, no skipping meals, and planning for success.  Katelyn Walsh has agreed to follow-up with our clinic in 3 weeks.   No orders of the defined types were placed in this encounter.   There are no discontinued medications.   No orders of the defined types were placed in this encounter.     Objective:   VITALS: Per patient if applicable, see vitals. GENERAL: Alert and in no acute distress. CARDIOPULMONARY: No increased WOB. Speaking in clear sentences.  PSYCH: Pleasant and cooperative. Speech normal rate and rhythm. Affect is appropriate. Insight and judgement are appropriate. Attention is focused, linear, and appropriate.  NEURO: Oriented as arrived to appointment on time with no prompting.   Lab Results  Component Value Date   CREATININE 1.0 11/10/2021   BUN 21 11/10/2021   NA 143 11/10/2021   K 4.2 11/10/2021   CL 113 (A) 11/10/2021   CO2 20 11/10/2021   Lab Results  Component Value Date   ALT  13 11/10/2021   AST 14 11/10/2021   ALKPHOS 89 11/10/2021   BILITOT 0.7 01/30/2020   Lab Results  Component Value Date   HGBA1C 5.7 11/10/2021   HGBA1C 5.6 11/27/2020   HGBA1C 6.2 (H) 01/30/2020   HGBA1C 6.0 (H) 10/02/2019   Lab Results  Component Value Date   INSULIN 9.7 01/30/2020   INSULIN 10.6 10/02/2019   INSULIN 20.3 05/11/2019   Lab Results  Component Value Date   TSH 0.997 10/02/2019   Lab Results  Component Value Date   CHOL 101 03/05/2020   HDL 44 03/05/2020   LDLCALC 36 03/05/2020   TRIG  105 03/05/2020   Lab Results  Component Value Date   WBC 6.0 04/26/2019   HGB 12.6 04/26/2019   HCT 40.5 04/26/2019   MCV 96.2 04/26/2019   PLT 171 04/26/2019   No results found for: "IRON", "TIBC", "FERRITIN" Lab Results  Component Value Date   VD25OH 51.3 10/02/2019   VD25OH 27 (L) 01/09/2014   VD25OH 34 01/05/2012    Attestation Statements:   Reviewed by clinician on day of visit: allergies, medications, problem list, medical history, surgical history, family history, social history, and previous encounter notes.

## 2022-01-07 ENCOUNTER — Telehealth (INDEPENDENT_AMBULATORY_CARE_PROVIDER_SITE_OTHER): Payer: Medicare Other | Admitting: Family Medicine

## 2022-01-07 ENCOUNTER — Encounter (INDEPENDENT_AMBULATORY_CARE_PROVIDER_SITE_OTHER): Payer: Self-pay | Admitting: Family Medicine

## 2022-01-07 DIAGNOSIS — Z6836 Body mass index (BMI) 36.0-36.9, adult: Secondary | ICD-10-CM | POA: Diagnosis not present

## 2022-01-07 DIAGNOSIS — E669 Obesity, unspecified: Secondary | ICD-10-CM

## 2022-01-07 DIAGNOSIS — R051 Acute cough: Secondary | ICD-10-CM | POA: Diagnosis not present

## 2022-01-07 DIAGNOSIS — R7303 Prediabetes: Secondary | ICD-10-CM | POA: Diagnosis not present

## 2022-01-07 DIAGNOSIS — Z6832 Body mass index (BMI) 32.0-32.9, adult: Secondary | ICD-10-CM

## 2022-01-07 MED ORDER — BENZONATATE 200 MG PO CAPS: 200.0000 mg | ORAL_CAPSULE | Freq: Two times a day (BID) | ORAL | 0 refills | Status: DC | PRN

## 2022-01-26 ENCOUNTER — Encounter (INDEPENDENT_AMBULATORY_CARE_PROVIDER_SITE_OTHER): Payer: Self-pay | Admitting: Physician Assistant

## 2022-01-26 ENCOUNTER — Other Ambulatory Visit: Payer: Self-pay | Admitting: Internal Medicine

## 2022-01-26 ENCOUNTER — Ambulatory Visit (INDEPENDENT_AMBULATORY_CARE_PROVIDER_SITE_OTHER): Payer: Medicare Other | Admitting: Physician Assistant

## 2022-01-26 ENCOUNTER — Ambulatory Visit (INDEPENDENT_AMBULATORY_CARE_PROVIDER_SITE_OTHER): Payer: Federal, State, Local not specified - PPO | Admitting: Adult Health

## 2022-01-26 VITALS — BP 119/76 | HR 76 | Temp 97.9°F | Ht 64.0 in | Wt 210.0 lb

## 2022-01-26 DIAGNOSIS — E669 Obesity, unspecified: Secondary | ICD-10-CM | POA: Diagnosis not present

## 2022-01-26 DIAGNOSIS — R7303 Prediabetes: Secondary | ICD-10-CM | POA: Diagnosis not present

## 2022-01-26 DIAGNOSIS — F3289 Other specified depressive episodes: Secondary | ICD-10-CM | POA: Diagnosis not present

## 2022-01-26 DIAGNOSIS — Z1231 Encounter for screening mammogram for malignant neoplasm of breast: Secondary | ICD-10-CM

## 2022-01-26 DIAGNOSIS — J302 Other seasonal allergic rhinitis: Secondary | ICD-10-CM

## 2022-01-26 DIAGNOSIS — Z6836 Body mass index (BMI) 36.0-36.9, adult: Secondary | ICD-10-CM

## 2022-01-26 MED ORDER — BUPROPION HCL ER (SR) 150 MG PO TB12: 150.0000 mg | ORAL_TABLET | Freq: Every day | ORAL | 0 refills | Status: DC

## 2022-01-26 MED ORDER — OZEMPIC (0.25 OR 0.5 MG/DOSE) 2 MG/1.5ML ~~LOC~~ SOPN: 0.5000 mg | PEN_INJECTOR | SUBCUTANEOUS | 0 refills | Status: DC

## 2022-02-09 NOTE — Progress Notes (Unsigned)
210    Chief Complaint:   OBESITY Katelyn Walsh is here to discuss her progress with her obesity treatment plan along with follow-up of her obesity related diagnoses. Katelyn Walsh is on the Category 2 Plan and states she is following her eating plan approximately 10-20% of the time. Katelyn Walsh states she is walking ? minutes ? times per week.  Today's visit was #: 36 Starting weight: 210 lbs Starting date: 05/11/2019 Today's weight: 210 lbs Today's date: 01/26/2022 Total lbs lost to date: 0 lbs Total lbs lost since last in-office visit: 0  Interim History: Katelyn Walsh has been struggling with sinus infection/COVID and allergies lately and has not been able to focus as much on her eating plan.  Has done some traveling to [place] and going to [place] as well. Struggles to stay on healthy eating plan while traveling. Discussed some travel strategies/focusing on protein goals.   Subjective:   1. Pre-diabetes Katelyn Walsh's A1c at 5.7/insulin at 9.7 on 11/10/21- near goal. Taking Ozempic 0.5 mg weekly--Denies any side effects.  2. Seasonal allergies Katelyn Walsh is on Flonase, Singular and Xyzal--reports recent exposure to grand children who were sick with URI as well. Plans to follow up with allergist.   3. Other depression, with emotional eating tendencies Katelyn Walsh is taking WellbutrinSR 150 mg daily. Denies SI/HI.Denies any side effects. Feels it is helping some with cravings.  Assessment/Plan:   1. Pre-diabetes We will refill Ozempic 0.5 mg SQ once a week for 3 months with 0 refills. Continue healthy eating plan and exercise.  - Refill Semaglutide,0.25 or 0.'5MG'$ /DOS, (OZEMPIC, 0.25 OR 0.5 MG/DOSE,) 2 MG/1.5ML SOPN; Inject 0.5 mg into the skin once a week.  Dispense: 4.5 mL; Refill: 0  2. Seasonal allergies Discussed considering RSV vaccine as she has young grandchildren.  3. Other depression, with emotional eating tendencies We will refill Wellbutrin SR 150 mg daily for 3 months with 0 refills. Continue  healthy eating plan and exercise.  -Refill buPROPion (WELLBUTRIN SR) 150 MG 12 hr tablet; Take 1 tablet (150 mg total) by mouth daily. (After lunch)  Dispense: 90 tablet; Refill: 0  4. Obesity with current BMI of 36.0 Katelyn Walsh is currently in the action stage of change. As such, her goal is to continue with weight loss efforts. She has agreed to the Category 2 Plan.   Exercise goals: All adults should avoid inactivity. Some physical activity is better than none, and adults who participate in any amount of physical activity gain some health benefits.  Behavioral modification strategies: increasing lean protein intake, decreasing simple carbohydrates, decreasing eating out, travel eating strategies, and planning for success.  Katelyn Walsh has agreed to follow-up with our clinic in 4 weeks. She was informed of the importance of frequent follow-up visits to maximize her success with intensive lifestyle modifications for her multiple health conditions.   Objective:   Blood pressure 119/76, pulse 76, temperature 97.9 F (36.6 C), height '5\' 4"'$  (1.626 m), weight 210 lb (95.3 kg), SpO2 98 %. Body mass index is 36.05 kg/m.  General: Cooperative, alert, well developed, in no acute distress. HEENT: Conjunctivae and lids unremarkable. Cardiovascular: Regular rhythm.  Lungs: Normal work of breathing. Neurologic: No focal deficits.   Lab Results  Component Value Date   CREATININE 1.0 11/10/2021   BUN 21 11/10/2021   NA 143 11/10/2021   K 4.2 11/10/2021   CL 113 (A) 11/10/2021   CO2 20 11/10/2021   Lab Results  Component Value Date   ALT 13 11/10/2021   AST 14  11/10/2021   ALKPHOS 89 11/10/2021   BILITOT 0.7 01/30/2020   Lab Results  Component Value Date   HGBA1C 5.7 11/10/2021   HGBA1C 5.6 11/27/2020   HGBA1C 6.2 (H) 01/30/2020   HGBA1C 6.0 (H) 10/02/2019   Lab Results  Component Value Date   INSULIN 9.7 01/30/2020   INSULIN 10.6 10/02/2019   INSULIN 20.3 05/11/2019   Lab Results   Component Value Date   TSH 0.997 10/02/2019   Lab Results  Component Value Date   CHOL 101 03/05/2020   HDL 44 03/05/2020   LDLCALC 36 03/05/2020   TRIG 105 03/05/2020   Lab Results  Component Value Date   VD25OH 51.3 10/02/2019   VD25OH 27 (L) 01/09/2014   VD25OH 34 01/05/2012   Lab Results  Component Value Date   WBC 6.0 04/26/2019   HGB 12.6 04/26/2019   HCT 40.5 04/26/2019   MCV 96.2 04/26/2019   PLT 171 04/26/2019   No results found for: "IRON", "TIBC", "FERRITIN"  Attestation Statements:   Reviewed by clinician on day of visit: allergies, medications, problem list, medical history, surgical history, family history, social history, and previous encounter notes.  I, Brendell Tyus, am acting as transcriptionist for [employer], [employer].  I have reviewed the above documentation for accuracy and completeness, and I agree with the above. -  Oziah Vitanza,PA-C

## 2022-02-24 ENCOUNTER — Ambulatory Visit (INDEPENDENT_AMBULATORY_CARE_PROVIDER_SITE_OTHER): Payer: Federal, State, Local not specified - PPO | Admitting: Adult Health

## 2022-03-10 DIAGNOSIS — J4530 Mild persistent asthma, uncomplicated: Secondary | ICD-10-CM | POA: Diagnosis not present

## 2022-03-10 DIAGNOSIS — L299 Pruritus, unspecified: Secondary | ICD-10-CM | POA: Diagnosis not present

## 2022-03-10 DIAGNOSIS — J301 Allergic rhinitis due to pollen: Secondary | ICD-10-CM | POA: Diagnosis not present

## 2022-03-10 DIAGNOSIS — J3081 Allergic rhinitis due to animal (cat) (dog) hair and dander: Secondary | ICD-10-CM | POA: Diagnosis not present

## 2022-03-10 DIAGNOSIS — J3089 Other allergic rhinitis: Secondary | ICD-10-CM | POA: Diagnosis not present

## 2022-03-23 ENCOUNTER — Ambulatory Visit
Admission: RE | Admit: 2022-03-23 | Discharge: 2022-03-23 | Disposition: A | Discharge status: Home / Self Care | Payer: Medicare Other | Source: Ambulatory Visit | Attending: Internal Medicine | Admitting: Internal Medicine

## 2022-03-23 DIAGNOSIS — Z1231 Encounter for screening mammogram for malignant neoplasm of breast: Secondary | ICD-10-CM

## 2022-04-22 IMAGING — MG MM DIGITAL SCREENING BILAT W/ TOMO AND CAD
6 of 10 series · 6 of 30 positions shown · non-contrast
Comparison: Previous exam(s).

CLINICAL DATA: Screening.

EXAM:
DIGITAL SCREENING BILATERAL MAMMOGRAM WITH TOMOSYNTHESIS AND CAD
TECHNIQUE: Bilateral screening digital craniocaudal and mediolateral oblique
mammograms were obtained. Bilateral screening digital breast
tomosynthesis was performed. The images were evaluated with
computer-aided detection.

[L CC synth-2D (1 of 2)]
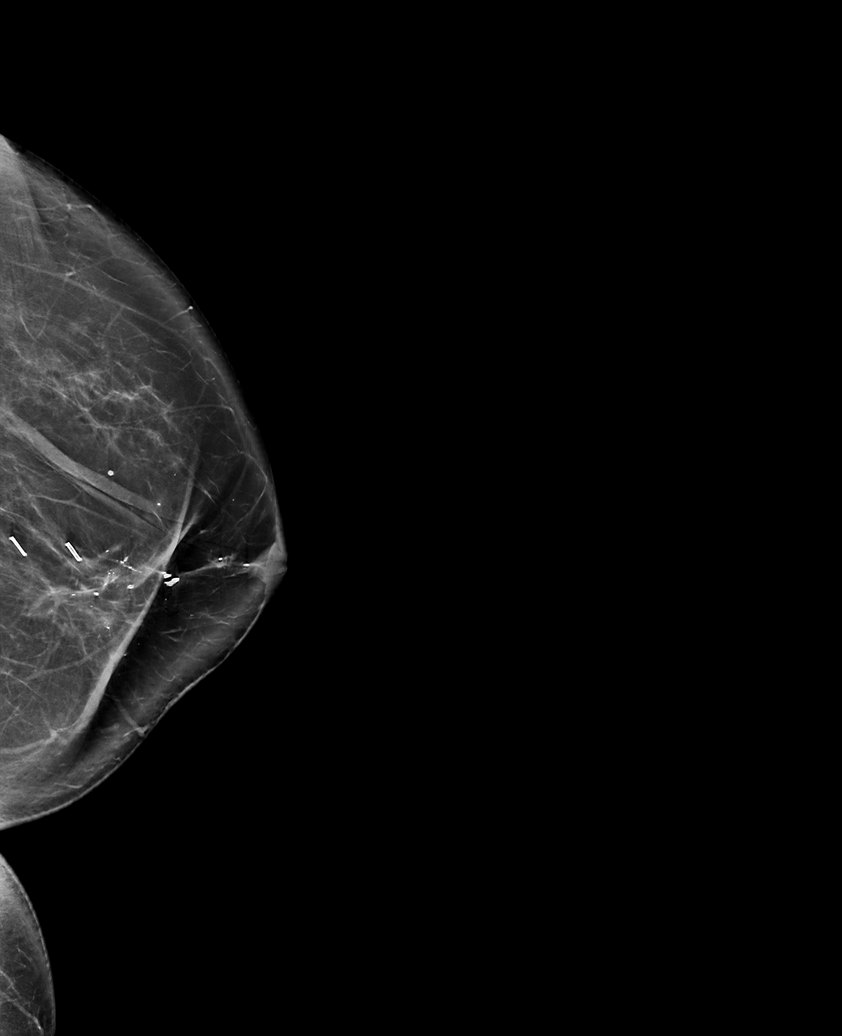

[L MLO synth-2D]
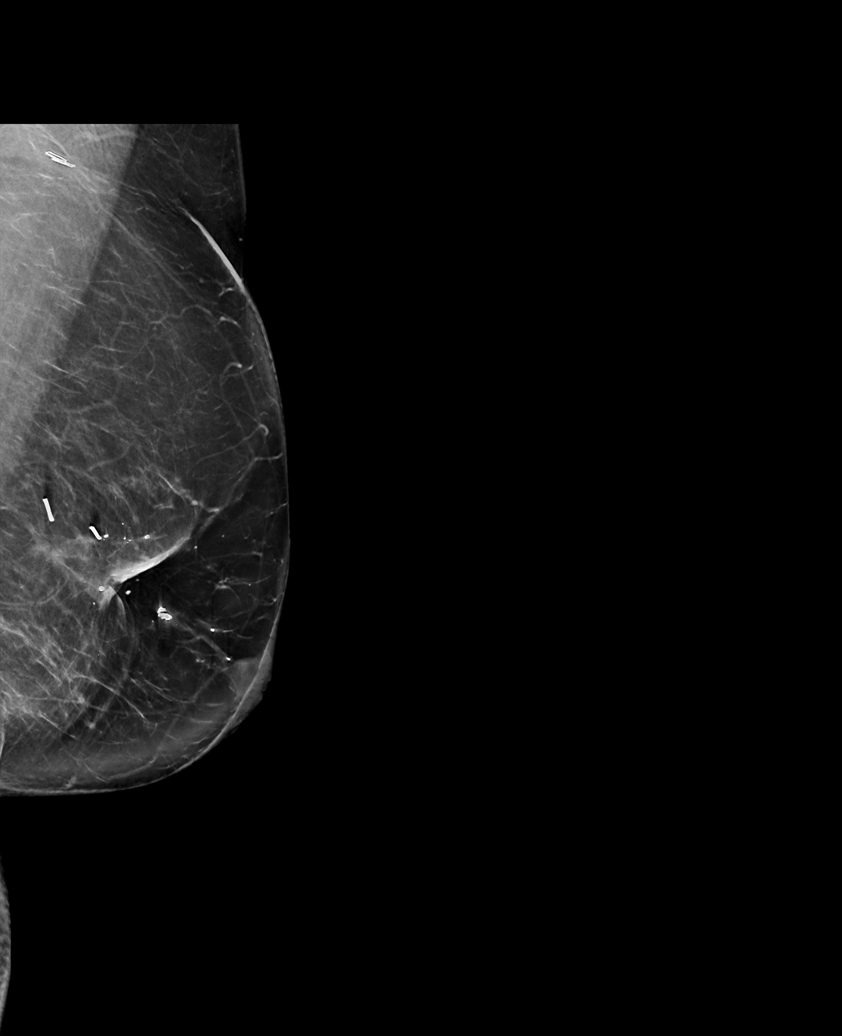

[L CC synth-2D (2 of 2)]
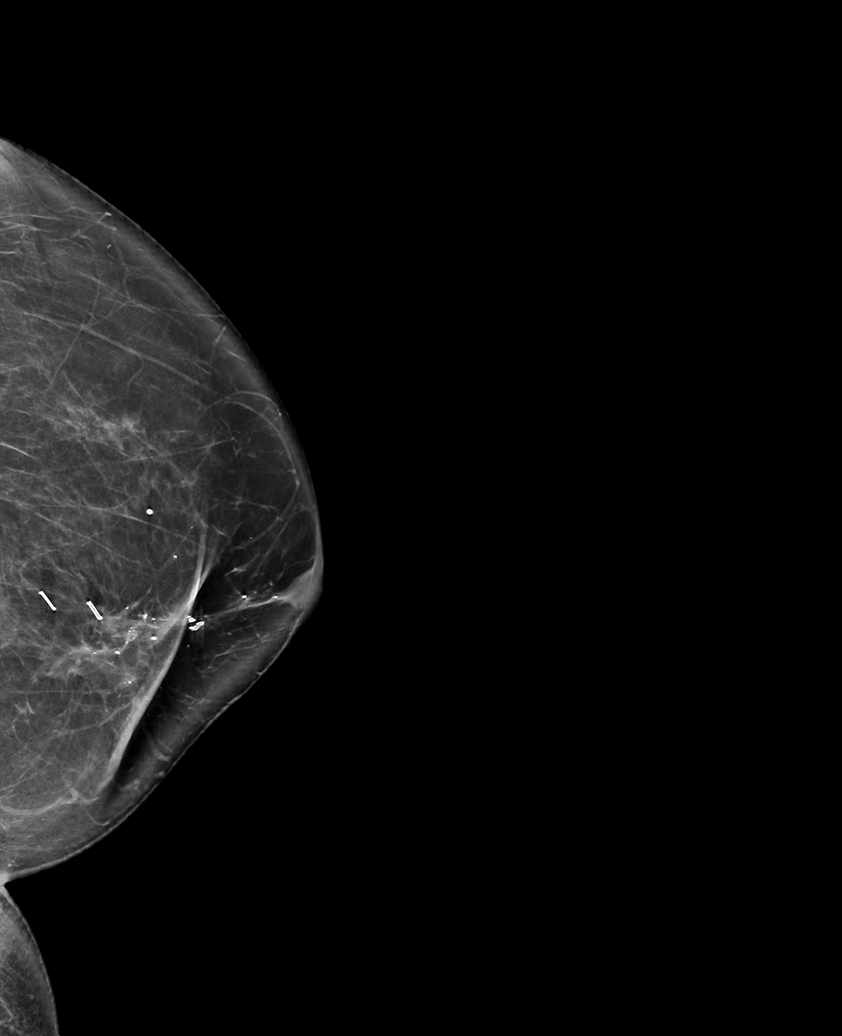

[R MLO synth-2D]
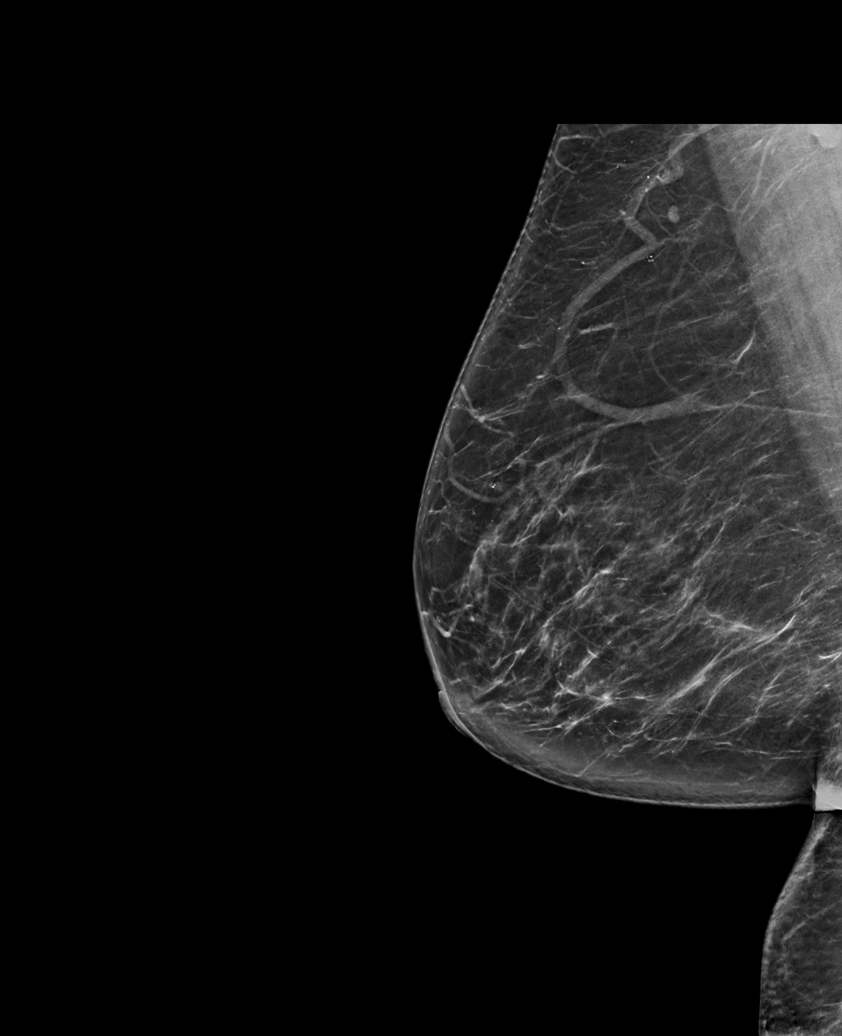

[R CC synth-2D]
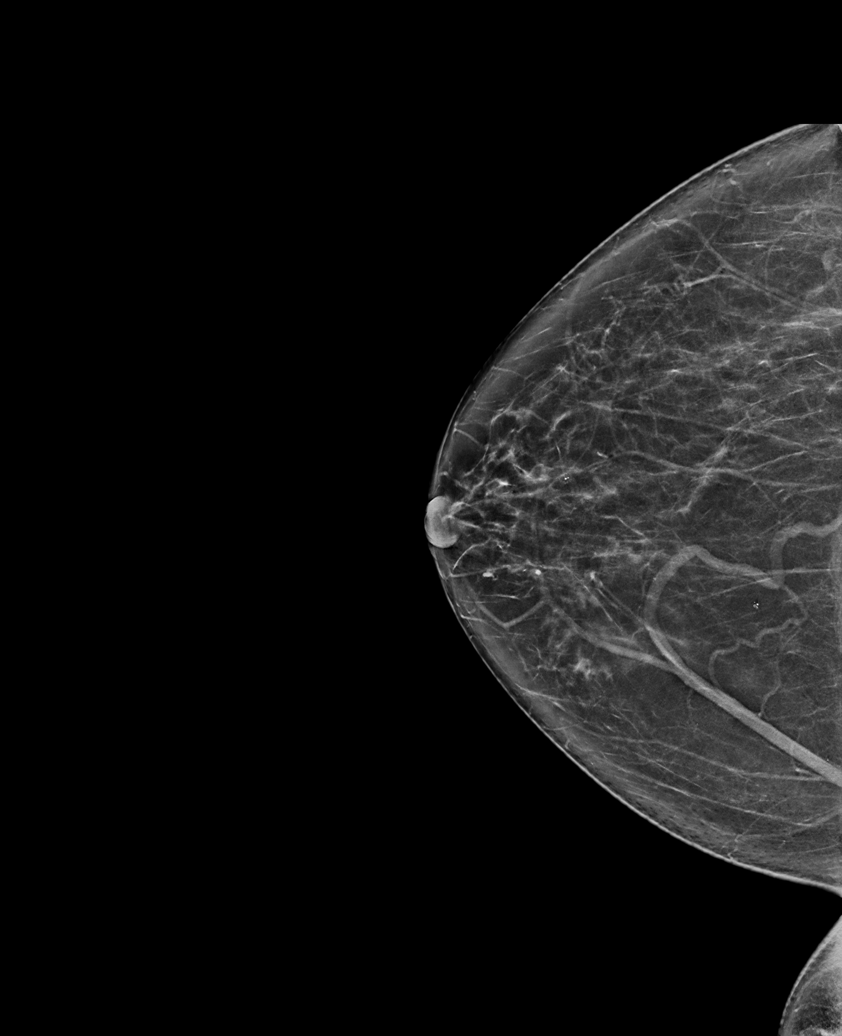

[L CC tomo · tomo slice 36/71.0]
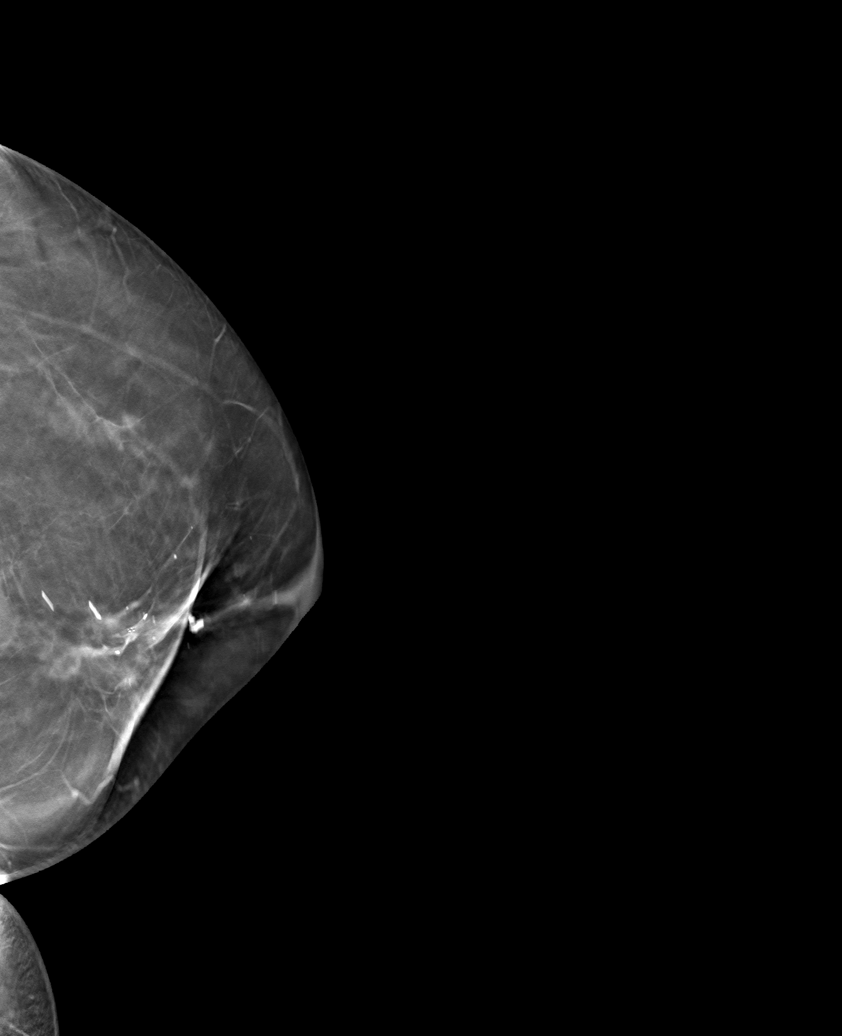

[6 of 30 positions shown; findings below may reference images not displayed]

ACR Breast Density Category b: There are scattered areas of
fibroglandular density.
FINDINGS: There are no findings suspicious for malignancy.
IMPRESSION: No mammographic evidence of malignancy. A result letter of this
screening mammogram will be mailed directly to the patient.

RECOMMENDATION:
Screening mammogram in one year. (Code:51-O-LD2)

BI-RADS CATEGORY  1: Negative.

## 2022-04-29 DIAGNOSIS — F419 Anxiety disorder, unspecified: Secondary | ICD-10-CM | POA: Diagnosis not present

## 2022-04-29 DIAGNOSIS — E1169 Type 2 diabetes mellitus with other specified complication: Secondary | ICD-10-CM | POA: Diagnosis not present

## 2022-04-29 DIAGNOSIS — N1832 Chronic kidney disease, stage 3b: Secondary | ICD-10-CM | POA: Diagnosis not present

## 2022-04-29 DIAGNOSIS — E559 Vitamin D deficiency, unspecified: Secondary | ICD-10-CM | POA: Diagnosis not present

## 2022-04-29 DIAGNOSIS — E785 Hyperlipidemia, unspecified: Secondary | ICD-10-CM | POA: Diagnosis not present

## 2022-05-04 DIAGNOSIS — E1169 Type 2 diabetes mellitus with other specified complication: Secondary | ICD-10-CM | POA: Diagnosis not present

## 2022-05-04 DIAGNOSIS — I10 Essential (primary) hypertension: Secondary | ICD-10-CM | POA: Diagnosis not present

## 2022-05-06 DIAGNOSIS — E1169 Type 2 diabetes mellitus with other specified complication: Secondary | ICD-10-CM | POA: Diagnosis not present

## 2022-05-06 DIAGNOSIS — Z0000 Encounter for general adult medical examination without abnormal findings: Secondary | ICD-10-CM | POA: Diagnosis not present

## 2022-05-06 DIAGNOSIS — N1832 Chronic kidney disease, stage 3b: Secondary | ICD-10-CM | POA: Diagnosis not present

## 2022-05-06 DIAGNOSIS — R32 Unspecified urinary incontinence: Secondary | ICD-10-CM | POA: Diagnosis not present

## 2022-05-06 DIAGNOSIS — F419 Anxiety disorder, unspecified: Secondary | ICD-10-CM | POA: Diagnosis not present

## 2022-05-06 DIAGNOSIS — E785 Hyperlipidemia, unspecified: Secondary | ICD-10-CM | POA: Diagnosis not present

## 2022-05-06 DIAGNOSIS — G2581 Restless legs syndrome: Secondary | ICD-10-CM | POA: Diagnosis not present

## 2022-05-06 DIAGNOSIS — I129 Hypertensive chronic kidney disease with stage 1 through stage 4 chronic kidney disease, or unspecified chronic kidney disease: Secondary | ICD-10-CM | POA: Diagnosis not present

## 2022-05-06 DIAGNOSIS — Z1331 Encounter for screening for depression: Secondary | ICD-10-CM | POA: Diagnosis not present

## 2022-05-06 DIAGNOSIS — Z1339 Encounter for screening examination for other mental health and behavioral disorders: Secondary | ICD-10-CM | POA: Diagnosis not present

## 2022-05-06 DIAGNOSIS — R110 Nausea: Secondary | ICD-10-CM | POA: Diagnosis not present

## 2022-06-24 DIAGNOSIS — N3946 Mixed incontinence: Secondary | ICD-10-CM | POA: Diagnosis not present

## 2022-06-24 DIAGNOSIS — R278 Other lack of coordination: Secondary | ICD-10-CM | POA: Diagnosis not present

## 2022-06-24 DIAGNOSIS — M6281 Muscle weakness (generalized): Secondary | ICD-10-CM | POA: Diagnosis not present

## 2022-07-21 DIAGNOSIS — H353131 Nonexudative age-related macular degeneration, bilateral, early dry stage: Secondary | ICD-10-CM | POA: Diagnosis not present

## 2022-07-21 DIAGNOSIS — H25813 Combined forms of age-related cataract, bilateral: Secondary | ICD-10-CM | POA: Diagnosis not present

## 2022-07-21 DIAGNOSIS — H524 Presbyopia: Secondary | ICD-10-CM | POA: Diagnosis not present

## 2022-07-23 DIAGNOSIS — Z09 Encounter for follow-up examination after completed treatment for conditions other than malignant neoplasm: Secondary | ICD-10-CM | POA: Diagnosis not present

## 2022-07-23 DIAGNOSIS — Z86010 Personal history of colonic polyps: Secondary | ICD-10-CM | POA: Diagnosis not present

## 2022-07-23 DIAGNOSIS — K648 Other hemorrhoids: Secondary | ICD-10-CM | POA: Diagnosis not present

## 2022-07-23 DIAGNOSIS — K5730 Diverticulosis of large intestine without perforation or abscess without bleeding: Secondary | ICD-10-CM | POA: Diagnosis not present

## 2022-07-27 DIAGNOSIS — G2581 Restless legs syndrome: Secondary | ICD-10-CM | POA: Diagnosis not present

## 2022-07-27 DIAGNOSIS — I872 Venous insufficiency (chronic) (peripheral): Secondary | ICD-10-CM | POA: Diagnosis not present

## 2022-07-27 DIAGNOSIS — R0600 Dyspnea, unspecified: Secondary | ICD-10-CM | POA: Diagnosis not present

## 2022-09-04 DIAGNOSIS — F419 Anxiety disorder, unspecified: Secondary | ICD-10-CM | POA: Diagnosis not present

## 2022-09-04 DIAGNOSIS — G2581 Restless legs syndrome: Secondary | ICD-10-CM | POA: Diagnosis not present

## 2022-09-04 DIAGNOSIS — E1169 Type 2 diabetes mellitus with other specified complication: Secondary | ICD-10-CM | POA: Diagnosis not present

## 2022-09-04 DIAGNOSIS — I872 Venous insufficiency (chronic) (peripheral): Secondary | ICD-10-CM | POA: Diagnosis not present

## 2022-09-04 DIAGNOSIS — N393 Stress incontinence (female) (male): Secondary | ICD-10-CM | POA: Diagnosis not present

## 2022-10-20 DIAGNOSIS — J3081 Allergic rhinitis due to animal (cat) (dog) hair and dander: Secondary | ICD-10-CM | POA: Diagnosis not present

## 2022-10-20 DIAGNOSIS — L299 Pruritus, unspecified: Secondary | ICD-10-CM | POA: Diagnosis not present

## 2022-10-20 DIAGNOSIS — J301 Allergic rhinitis due to pollen: Secondary | ICD-10-CM | POA: Diagnosis not present

## 2022-10-20 DIAGNOSIS — J4530 Mild persistent asthma, uncomplicated: Secondary | ICD-10-CM | POA: Diagnosis not present

## 2022-10-20 DIAGNOSIS — J3089 Other allergic rhinitis: Secondary | ICD-10-CM | POA: Diagnosis not present

## 2022-10-29 DIAGNOSIS — R8271 Bacteriuria: Secondary | ICD-10-CM | POA: Diagnosis not present

## 2022-10-29 DIAGNOSIS — R350 Frequency of micturition: Secondary | ICD-10-CM | POA: Diagnosis not present

## 2022-10-29 DIAGNOSIS — N3946 Mixed incontinence: Secondary | ICD-10-CM | POA: Diagnosis not present

## 2022-10-29 DIAGNOSIS — R351 Nocturia: Secondary | ICD-10-CM | POA: Diagnosis not present

## 2022-11-09 DIAGNOSIS — G2581 Restless legs syndrome: Secondary | ICD-10-CM | POA: Diagnosis not present

## 2022-11-09 DIAGNOSIS — I872 Venous insufficiency (chronic) (peripheral): Secondary | ICD-10-CM | POA: Diagnosis not present

## 2022-11-09 DIAGNOSIS — Z23 Encounter for immunization: Secondary | ICD-10-CM | POA: Diagnosis not present

## 2022-11-09 DIAGNOSIS — E1169 Type 2 diabetes mellitus with other specified complication: Secondary | ICD-10-CM | POA: Diagnosis not present

## 2022-11-09 DIAGNOSIS — F419 Anxiety disorder, unspecified: Secondary | ICD-10-CM | POA: Diagnosis not present

## 2022-11-09 DIAGNOSIS — N393 Stress incontinence (female) (male): Secondary | ICD-10-CM | POA: Diagnosis not present

## 2022-11-19 DIAGNOSIS — N3946 Mixed incontinence: Secondary | ICD-10-CM | POA: Diagnosis not present

## 2022-11-26 ENCOUNTER — Inpatient Hospital Stay: Payer: Medicare Other | Attending: Hematology and Oncology | Admitting: Hematology and Oncology

## 2022-11-26 ENCOUNTER — Encounter: Payer: Self-pay | Admitting: Cardiology

## 2022-11-26 ENCOUNTER — Ambulatory Visit (INDEPENDENT_AMBULATORY_CARE_PROVIDER_SITE_OTHER): Payer: Medicare Other | Admitting: Cardiology

## 2022-11-26 VITALS — BP 118/60 | HR 70 | Ht 64.0 in | Wt 228.0 lb

## 2022-11-26 VITALS — BP 146/68 | HR 78 | Temp 97.9°F | Resp 18 | Ht 64.0 in | Wt 228.2 lb

## 2022-11-26 DIAGNOSIS — Z79899 Other long term (current) drug therapy: Secondary | ICD-10-CM | POA: Diagnosis not present

## 2022-11-26 DIAGNOSIS — E7849 Other hyperlipidemia: Secondary | ICD-10-CM | POA: Diagnosis not present

## 2022-11-26 DIAGNOSIS — R011 Cardiac murmur, unspecified: Secondary | ICD-10-CM | POA: Diagnosis not present

## 2022-11-26 DIAGNOSIS — Z08 Encounter for follow-up examination after completed treatment for malignant neoplasm: Secondary | ICD-10-CM | POA: Diagnosis not present

## 2022-11-26 DIAGNOSIS — C50212 Malignant neoplasm of upper-inner quadrant of left female breast: Secondary | ICD-10-CM | POA: Diagnosis not present

## 2022-11-26 DIAGNOSIS — R600 Localized edema: Secondary | ICD-10-CM | POA: Diagnosis not present

## 2022-11-26 DIAGNOSIS — Z853 Personal history of malignant neoplasm of breast: Secondary | ICD-10-CM | POA: Diagnosis not present

## 2022-11-26 DIAGNOSIS — Z0289 Encounter for other administrative examinations: Secondary | ICD-10-CM

## 2022-11-26 DIAGNOSIS — I10 Essential (primary) hypertension: Secondary | ICD-10-CM | POA: Insufficient documentation

## 2022-11-26 NOTE — Progress Notes (Signed)
Cardiology Office Note:  .   Date:  11/26/2022  ID:  Katelyn Walsh, DOB 05-08-1950, MRN [id number] PCP: Alysia Penna, MD  [facility] Providers Cardiologist:  Donato Schultz, MD     History of Present Illness: .   Katelyn Walsh is a 72 y.o. female Discussed the use of AI scribe software   History of Present Illness   The patient, a 72 year old with a history of breast cancer status post lumpectomy and radiation therapy, hypertension, hyperlipidemia, anxiety, and migraines, presents for a new patient evaluation after approximately four years. The patient's primary concern is swelling in the legs, which has been associated with the development of varicose veins and spider veins. The patient also reports experiencing increased back pain, which she attributes to recent weight gain. She plans to rejoin a weight loss program to address this issue.  The patient's history includes a coronary CT in 2020, which showed a coronary calcium score of 173, placing her in the 81st percentile with nonobstructive CAD and mild proximal LAD plaque. An echocardiogram in 2021 showed an ejection fraction of 55-60% with grade one diastolic dysfunction and pulmonary pressures of 24 mmHg.  The patient also reports restless leg syndrome, which is particularly bothersome during the night and while traveling. Current medications include Cozaar, rosuvastatin, Ozempic, and Lasix, which is taken as needed. The patient is considering increasing the frequency of Lasix use to address leg swelling.  The patient's father had a history of glioblastoma and stroke, and the patient is aware of the potential cardiovascular risks associated with her personal medical history and family history. She is committed to managing her health and is proactive in discussing her care and treatment options.          Studies Reviewed: [name] Kitchen   EKG Interpretation Date/Time:  Thursday November 26 2022 13:33:41 EDT Ventricular Rate:  70 PR  Interval:  178 QRS Duration:  80 QT Interval:  398 QTC Calculation: 429 R Axis:   -1  Text Interpretation: Normal sinus rhythm Normal ECG When compared with ECG of 25-Apr-2019 16:52, Vent. rate has decreased BY  34 BPM Confirmed by Donato Schultz ([id number]) on 11/26/2022 1:43:22 PM    Results RADIOLOGY Coronary CT: Coronary calcium score 173, 80th percentile, nonobstructive CAD, mild proximal LAD plaque (07/22/2018)  DIAGNOSTIC Echocardiogram: Ejection fraction 55-60%, grade 1 diastolic dysfunction, pulmonary pressures 24 mmHg (04/26/2019)  Risk Assessment/Calculations:            Physical Exam:   VS:  BP 118/60   Pulse 70   Ht 5\' 4"  (1.626 m)   Wt 228 lb (103.4 kg)   SpO2 96%   BMI 39.14 kg/m    Wt Readings from Last 3 Encounters:  11/26/22 228 lb (103.4 kg)  11/26/22 228 lb 3.2 oz (103.5 kg)  01/26/22 210 lb (95.3 kg)    GEN: Well nourished, well developed in no acute distress NECK: No JVD; No carotid bruits CARDIAC: RRR, 1/6 systolic murmur, no rubs, no gallops RESPIRATORY:  Clear to auscultation without rales, wheezing or rhonchi  ABDOMEN: Soft, non-tender, non-distended EXTREMITIES: Minimal edema; No deformity, varicose veins  ASSESSMENT AND PLAN: .       Lower Extremity Edema Chronic, likely secondary to venous insufficiency. No acute distress. Discussed the benefits of leg elevation, low salt diet, and weight loss. -Take Lasix for 2-3 days straight to assess for improvement in swelling. -Continue leg elevation and low salt diet.  Heart Murmur Soft murmur noted on exam. Last echocardiogram  in 2021 showed normal ejection fraction and grade 1 diastolic dysfunction. -Schedule echocardiogram to evaluate current heart function and murmur.  Weight Gain Patient reports weight gain and plans to rejoin weight loss program. -Encouraged continuation of weight loss efforts.  Restless Leg Syndrome Chronic, managed with Ropinirole. No changes recommended at this  time.  Hyperlipidemia, Hypertension, and History of Breast Cancer Stable on current medications (Rosuvastatin, Aspirin, Losartan). -Continue current medications.  Follow-up PRN, pending results of echocardiogram.               Signed, Donato Schultz, MD

## 2022-11-26 NOTE — Progress Notes (Signed)
Patient Care Team: Alysia Penna, MD as PCP - General (Internal Medicine) Jake Bathe, MD as PCP - Cardiology (Cardiology) Kirby Funk, MD (Inactive) as Attending Physician (Internal Medicine) Launa Flight, MD as Referring Physician (Family Medicine)  DIAGNOSIS:  Encounter Diagnosis  Name Primary?   Primary cancer of upper inner quadrant of left female breast (HCC) Yes    SUMMARY OF ONCOLOGIC HISTORY: Oncology History  Primary cancer of upper inner quadrant of left female breast (HCC)  07/31/2011 Surgery   left breast lumpectomy: IDC grade to 1.8 cm, no LV are, with DCIS, 3 sentinel nodes negative, grade 2, ER 100%, PR 0%, HER-2 negative, Ki-67 9%, T1 cN0 M0 stage IA, Oncotype score 18, 11% ROR low risk   10/14/2011 - 11/17/2011 Radiation Therapy   Adjuvant Radiation therapy   10/14/2011 - 10/03/2018 Anti-estrogen oral therapy   Arimidex 1 mg daily developed a change switched to Femara and exemestane and then took tamoxifen and now back to Arimidex from 01/09/2014       CHIEF COMPLIANT: Surveillance of breast cancer    History of Present Illness   Katelyn Walsh, a patient with a history of breast cancer, presents with bilateral foot swelling. She reports that the swelling was initially only in her right foot but has now progressed to both feet. She is unsure of the cause of the swelling and has an upcoming appointment with Dr. Nonnie Walsh to investigate further. She denies any other health issues or changes since her last visit. Katelyn Walsh also mentions her frequent travels and activities, indicating a generally active lifestyle.      ALLERGIES:  is allergic to cat hair extract, dust mite extract, and pollen extract.  MEDICATIONS:  Current Outpatient Medications  Medication Sig Dispense Refill   ALPRAZolam (XANAX) 0.5 MG tablet Take 1 tablet (0.5 mg total) by mouth at bedtime as needed for anxiety. 30 tablet 0   aspirin EC 81 MG tablet Take 81 mg by mouth daily.     cholecalciferol  5000 units TABS Take 1 tablet (5,000 Units total) by mouth daily.     citalopram (CELEXA) 20 MG tablet Take 20 mg by mouth daily.     furosemide (LASIX) 40 MG tablet Take 40 mg by mouth. PRN     hydrOXYzine (ATARAX/VISTARIL) 25 MG tablet Take 1 tablet (25 mg total) by mouth 3 (three) times daily as needed. 30 tablet 6   levocetirizine (XYZAL) 5 MG tablet Take 1 tablet (5 mg total) by mouth every evening.     losartan (COZAAR) 100 MG tablet Take 100 mg by mouth every morning.      montelukast (SINGULAIR) 10 MG tablet Take 10 mg by mouth at bedtime.     Probiotic Product (ALIGN) 4 MG CAPS Take 1 capsule by mouth daily with breakfast.      rOPINIRole (REQUIP) 1 MG tablet Take 1-2 mg by mouth at bedtime.      rosuvastatin (CRESTOR) 20 MG tablet Take 20 mg by mouth at bedtime.     Semaglutide,0.25 or 0.5MG /DOS, (OZEMPIC, 0.25 OR 0.5 MG/DOSE,) 2 MG/1.5ML SOPN Inject 0.5 mg into the skin once a week. 4.5 mL 0   topiramate (TOPAMAX) 100 MG tablet Take 100 mg by mouth 2 (two) times daily.     No current facility-administered medications for this visit.    PHYSICAL EXAMINATION: ECOG PERFORMANCE STATUS: 1 - Symptomatic but completely ambulatory  Vitals:   11/26/22 1146  BP: (!) 146/68  Pulse: 78  Resp: 18  Temp: 97.9  F (36.6 C)  SpO2: 100%   Filed Weights   11/26/22 1146  Weight: 228 lb 3.2 oz (103.5 kg)    Physical Exam   EXTREMITIES: Bilateral lower extremity edema. Erythema noted on feet.        LABORATORY DATA:  I have reviewed the data as listed    Latest Ref Rng & Units 11/10/2021   12:00 AM 01/30/2020    2:00 PM 10/02/2019   12:58 PM  CMP  Glucose 65 - 99 mg/dL  782  956   BUN 4 - 21 21     17  19    Creatinine 0.5 - 1.1 1.0     1.01  0.87   Sodium 137 - 147 143     143  140   Potassium 3.5 - 5.1 mEq/L 4.2     4.3  4.4   Chloride 99 - 108 113     104  105   CO2 13 - 22 20     23  21    Calcium 8.7 - 10.7 8.5     9.7  9.0   Total Protein 6.0 - 8.5 g/dL  7.6  7.7   Total  Bilirubin 0.0 - 1.2 mg/dL  0.7  0.5   Alkaline Phos 25 - 125 89     91  92   AST 13 - 35 14     18  19    ALT 7 - 35 U/L 13     18  19       This result is from an external source.    Lab Results  Component Value Date   WBC 6.0 04/26/2019   HGB 12.6 04/26/2019   HCT 40.5 04/26/2019   MCV 96.2 04/26/2019   PLT 171 04/26/2019   NEUTROABS 3.1 07/31/2014    ASSESSMENT & PLAN:  Primary cancer of upper inner quadrant of left female breast (HCC) Left breast invasive ductal carcinoma, 1.8 cm, T1 C. N0 M0 stage IA ER percent, PR negative, HER-2 negative, grade 2, Ki-67 90%, 3 SLN negative, Oncotype DX recurrence score 18, percent risk of recurrence, status post radiation therapy, Arimidex 1 mg daily was started August 2013 later switched to exemestane then switched to Femara and then switch to tamoxifen and again switched back to Anastrozole Nov 2015.  Completed August 2020     1. Itching of the skin: Improved.  3. Weight gain issues:  She is working with healthy weight loss clinic and has lost 30 pounds.  Because of her multiple travels to [facility] and [facility], she has gained some weight.   Patient loves going to [facility].    Breast Cancer Surveillance: Mammogram 03/25/2022 no evidence of malignancy breast density category B 12/15/2016: MRI pelvis: Perianal fistulous  Lower extremity edema due to diastolic dysfunction on diuretics.  Bone density December 2015 revealed a T score of -0.9, normal bone density   Return to clinic in 1 year for follow-up for surveillance.      Breast Cancer 11 years post-surgery, no reported pain or discomfort. Mammograms have been consistently good. -Continue regular mammograms as per guidelines.  Lower Extremity Edema Bilateral lower extremity swelling, possibly related to cardiac issues. Patient has an appointment with Dr. Nonnie Walsh for further evaluation. -Continue to monitor and follow up with Dr. Nonnie Walsh.  Bone Density No bone density test for the  past two years. Previous tests have shown good bone health. -Plan for bone density test at a convenient location.  General Health  Maintenance -Continue with weight loss efforts for overall health improvement. -Provide prescription for bras and prosthesis as requested.          No orders of the defined types were placed in this encounter.  The patient has a good understanding of the overall plan. she agrees with it. she will call with any problems that may develop before the next visit here. Total time spent: 30 mins including face to face time and time spent for planning, charting and co-ordination of care   Tamsen Meek, MD 11/26/22

## 2022-11-26 NOTE — Patient Instructions (Signed)
Medication Instructions:  The current medical regimen is effective;  continue present plan and medications.  *If you need a refill on your cardiac medications before your next appointment, please call your pharmacy*   Testing/Procedures: Your physician has requested that you have an echocardiogram. Echocardiography is a painless test that uses sound waves to create images of your heart. It provides your doctor with information about the size and shape of your heart and how well your heart's chambers and valves are working. This procedure takes approximately one hour. There are no restrictions for this procedure. Please do NOT wear cologne, perfume, aftershave, or lotions (deodorant is allowed). Please arrive 15 minutes prior to your appointment time.   Follow-Up: At [facility], you and your health needs are our priority.  As part of our continuing mission to provide you with exceptional heart care, we have created designated Provider Care Teams.  These Care Teams include your primary Cardiologist (physician) and Advanced Practice Providers (APPs -  Physician Assistants and Nurse Practitioners) who all work together to provide you with the care you need, when you need it.  We recommend signing up for the patient portal called "MyChart".  Sign up information is provided on this After Visit Summary.  MyChart is used to connect with patients for Virtual Visits (Telemedicine).  Patients are able to view lab/test results, encounter notes, upcoming appointments, etc.  Non-urgent messages can be sent to your provider as well.   To learn more about what you can do with MyChart, go to [email or web address].    Your next appointment:   Follow up as needed with Dr Anne Fu

## 2022-11-26 NOTE — Assessment & Plan Note (Signed)
Left breast invasive ductal carcinoma, 1.8 cm, T1 C. N0 M0 stage IA ER percent, PR negative, HER-2 negative, grade 2, Ki-67 90%, 3 SLN negative, Oncotype DX recurrence score 18, percent risk of recurrence, status post radiation therapy, Arimidex 1 mg daily was started August 2013 later switched to exemestane then switched to Femara and then switch to tamoxifen and again switched back to Anastrozole Nov 2015.  Completed August 2020     1. Itching of the skin: Improved.  3. Weight gain issues:  She is working with healthy weight loss clinic and has lost 30 pounds.  Because of her multiple travels to [facility] and [facility], she has gained some weight.   Patient loves going to [facility].    Breast Cancer Surveillance: Mammogram 03/25/2022 no evidence of malignancy breast density category B 12/15/2016: MRI pelvis: Perianal fistulous  Lower extremity edema due to diastolic dysfunction on diuretics.  Bone density December 2015 revealed a T score of -0.9, normal bone density   Return to clinic in 1 year for follow-up for surveillance.

## 2022-12-01 DIAGNOSIS — R350 Frequency of micturition: Secondary | ICD-10-CM | POA: Diagnosis not present

## 2022-12-01 DIAGNOSIS — N3946 Mixed incontinence: Secondary | ICD-10-CM | POA: Diagnosis not present

## 2022-12-07 ENCOUNTER — Ambulatory Visit (INDEPENDENT_AMBULATORY_CARE_PROVIDER_SITE_OTHER): Payer: Medicare Other | Admitting: Physician Assistant

## 2022-12-10 DIAGNOSIS — R5383 Other fatigue: Secondary | ICD-10-CM | POA: Diagnosis not present

## 2022-12-10 DIAGNOSIS — Z1152 Encounter for screening for COVID-19: Secondary | ICD-10-CM | POA: Diagnosis not present

## 2022-12-10 DIAGNOSIS — J209 Acute bronchitis, unspecified: Secondary | ICD-10-CM | POA: Diagnosis not present

## 2022-12-10 DIAGNOSIS — R051 Acute cough: Secondary | ICD-10-CM | POA: Diagnosis not present

## 2022-12-10 DIAGNOSIS — R0981 Nasal congestion: Secondary | ICD-10-CM | POA: Diagnosis not present

## 2022-12-10 DIAGNOSIS — J069 Acute upper respiratory infection, unspecified: Secondary | ICD-10-CM | POA: Diagnosis not present

## 2022-12-11 ENCOUNTER — Ambulatory Visit (HOSPITAL_COMMUNITY): Payer: Federal, State, Local not specified - PPO

## 2022-12-15 ENCOUNTER — Ambulatory Visit (INDEPENDENT_AMBULATORY_CARE_PROVIDER_SITE_OTHER): Payer: Medicare Other | Admitting: Physician Assistant

## 2022-12-21 ENCOUNTER — Ambulatory Visit (INDEPENDENT_AMBULATORY_CARE_PROVIDER_SITE_OTHER): Payer: Medicare Other | Admitting: Adult Health

## 2022-12-21 ENCOUNTER — Encounter (INDEPENDENT_AMBULATORY_CARE_PROVIDER_SITE_OTHER): Payer: Self-pay | Admitting: Adult Health

## 2022-12-21 VITALS — BP 121/76 | HR 70 | Temp 97.8°F | Ht 64.0 in | Wt 223.0 lb

## 2022-12-21 DIAGNOSIS — R7303 Prediabetes: Secondary | ICD-10-CM | POA: Diagnosis not present

## 2022-12-21 DIAGNOSIS — R0602 Shortness of breath: Secondary | ICD-10-CM

## 2022-12-21 DIAGNOSIS — Z6838 Body mass index (BMI) 38.0-38.9, adult: Secondary | ICD-10-CM

## 2022-12-21 DIAGNOSIS — E559 Vitamin D deficiency, unspecified: Secondary | ICD-10-CM

## 2022-12-21 DIAGNOSIS — R944 Abnormal results of kidney function studies: Secondary | ICD-10-CM | POA: Diagnosis not present

## 2022-12-21 MED ORDER — SEMAGLUTIDE (1 MG/DOSE) 4 MG/3ML ~~LOC~~ SOPN: 1.0000 mg | PEN_INJECTOR | SUBCUTANEOUS | 0 refills | Status: DC

## 2022-12-21 NOTE — Progress Notes (Addendum)
WEIGHT SUMMARY AND BIOMETRICS  Vitals Temp: 97.8 F (36.6 C) BP: 121/76 Pulse Rate: 70 SpO2: 95 %   Anthropometric Measurements Height: 5\' 4"  (1.626 m) Weight: 223 lb (101.2 kg) BMI (Calculated): 38.26 Weight at Last Visit: 0 Weight Lost Since Last Visit: 0 Weight Gained Since Last Visit: 0 Starting Weight: 223lb Total Weight Loss (lbs): 0 lb (0 kg)   Body Composition  Body Fat %: 48.3 % Fat Mass (lbs): 108 lbs Muscle Mass (lbs): 110 lbs Total Body Water (lbs): 83.6 lbs Visceral Fat Rating : 16   Other Clinical Data RMR: 1627 Fasting: yes Labs: yes Today's Visit #: 1 Starting Date: 12/21/22 Comments: Restarting    Chief Complaint:   OBESITY Katelyn Walsh is here to discuss her progress with her obesity treatment plan. She is on the the Category 2 Plan and states she is following her eating plan approximately 0 % of the time. She states she is exercising NEAT and walking.   Interim History: Last virtual OV with HWW on 01/07/2022 Last in office appt with H[facility] on 01/26/2022  She has been lost to f/u at [facility] since that time.  She has been on Ozempic 0.5mg  on/about 06/02/2021 HWW started her on GLP-1 therapy, PCP assumed prescribing as she was lost to f/u at [facility]  In the interim- She has been traveling frequently Acutely ill with URI the last 4-6 weeks  Subjective:   1. SOB (shortness of breath) on exertion She endorses dyspnea with extreme exertion, denies CP  01/30/20 12:00  RMR 1495  Metabolism slower than expected    12/21/22 10:00  RMR 1627  Metabolism increased by 132 calories and now at expected at metabolic rate  2. Pre-diabetes Lab Results  Component Value Date   HGBA1C 5.7 11/10/2021   HGBA1C 5.6 11/27/2020   HGBA1C 6.2 (H) 01/30/2020    PCP has been refilling weekly Ozempic 0.5mg  Denies mass in neck, dysphagia, dyspepsia, persistent hoarseness, abdominal pain, or N/V/C  She reports increased polyphagia She has been on the lowest Ozempic  mx dose since 06/02/2021  Of note- She was started on Ozempic 0.25mg  on/about 02/28/2020 06/02/2021 increased to Ozempic 0.5mg    3. Decreased GFR  Latest Reference Range & Units 01/30/20 14:00 11/10/21 00:00  Creatinine 0.5 - 1.1  1.01 (H) 1.0 (E)  (H): Data is abnormally high (E): External lab result  Latest Reference Range & Units 10/02/19 12:58 01/30/20 14:00  GFR, Est Non African American >59 mL/min/1.73 68 57 (L)  (L): Data is abnormally low  PCP manages daily Losartan 100mg  She rarely uses PRN Lasix 40mg   6. Vitamin D deficiency  Latest Reference Range & Units 10/02/19 13:02  Vitamin D, 25-Hydroxy 30.0 - 100.0 ng/mL 51.3   She reports daily OTC Vit D 3 5,000 international units   Assessment/Plan:   1. SOB (shortness of breath) on exertion Check IC Remain on Cat 2 meal plan  2. Pre-diabetes Check Labs - Insulin, random - Vitamin B12 Refill and increase Semaglutide, 1 MG/DOSE, 4 MG/3ML SOPN Inject 1 mg as directed once a week. Dispense: 9 mL, Refills: 0 ordered   3. Decreased GFR Check Labs - Comprehensive metabolic panel  4. Vitamin D deficiency Check Labs - VITAMIN D 25 Hydroxy (Vit-D Deficiency, Fractures)  5. Morbid obesity (HCC), Staring BMI 38.4  Jedidiah is not currently in the action stage of change. As such, her goal is to get back to weightloss efforts . She has agreed to the Category 2 Plan.  Exercise goals: Older adults should follow the adult guidelines. When older adults cannot meet the adult guidelines, they should be as physically active as their abilities and conditions will allow.  Older adults should do exercises that maintain or improve balance if they are at risk of falling.  Older adults should determine their level of effort for physical activity relative to their level of fitness.  Older adults with chronic conditions should understand whether and how their conditions affect their ability to do regular physical activity safely.  Behavioral  modification strategies: increasing lean protein intake, decreasing simple carbohydrates, increasing vegetables, increasing water intake, decreasing liquid calories, decreasing sodium intake, increasing high fiber foods, decreasing eating out, meal planning and cooking strategies, keeping healthy foods in the home, ways to avoid boredom eating, ways to avoid night time snacking, and planning for success.  Chondra has agreed to follow-up with our clinic in 3 weeks. She was informed of the importance of frequent follow-up visits to maximize her success with intensive lifestyle modifications for her multiple health conditions.   Ekaterina was informed we would discuss her lab results at her next visit unless there is a critical issue that needs to be addressed sooner. Najja agreed to keep her next visit at the agreed upon time to discuss these results.  Objective:   Blood pressure 121/76, pulse 70, temperature 97.8 F (36.6 C), height 5\' 4"  (1.626 m), weight 223 lb (101.2 kg), SpO2 95%. Body mass index is 38.28 kg/m.  General: Cooperative, alert, well developed, in no acute distress. HEENT: Conjunctivae and lids unremarkable. Cardiovascular: Regular rhythm.  Lungs: Normal work of breathing. Neurologic: No focal deficits.   Lab Results  Component Value Date   CREATININE 1.0 11/10/2021   BUN 21 11/10/2021   NA 143 11/10/2021   K 4.2 11/10/2021   CL 113 (A) 11/10/2021   CO2 20 11/10/2021   Lab Results  Component Value Date   ALT 13 11/10/2021   AST 14 11/10/2021   ALKPHOS 89 11/10/2021   BILITOT 0.7 01/30/2020   Lab Results  Component Value Date   HGBA1C 5.7 11/10/2021   HGBA1C 5.6 11/27/2020   HGBA1C 6.2 (H) 01/30/2020   HGBA1C 6.0 (H) 10/02/2019   Lab Results  Component Value Date   INSULIN 9.7 01/30/2020   INSULIN 10.6 10/02/2019   INSULIN 20.3 05/11/2019   Lab Results  Component Value Date   TSH 0.997 10/02/2019   Lab Results  Component Value Date   CHOL 101 03/05/2020    HDL 44 03/05/2020   LDLCALC 36 03/05/2020   TRIG 105 03/05/2020   Lab Results  Component Value Date   VD25OH 51.3 10/02/2019   VD25OH 27 (L) 01/09/2014   VD25OH 34 01/05/2012   Lab Results  Component Value Date   WBC 6.0 04/26/2019   HGB 12.6 04/26/2019   HCT 40.5 04/26/2019   MCV 96.2 04/26/2019   PLT 171 04/26/2019   No results found for: "IRON", "TIBC", "FERRITIN"  Attestation Statements:   Reviewed by clinician on day of visit: allergies, medications, problem list, medical history, surgical history, family history, social history, and previous encounter notes.  I have reviewed the above documentation for accuracy and completeness, and I agree with the above. - Mitsuko Luera d. Jesalyn Finazzo, NP-C

## 2022-12-22 DIAGNOSIS — R519 Headache, unspecified: Secondary | ICD-10-CM | POA: Diagnosis not present

## 2022-12-22 DIAGNOSIS — R051 Acute cough: Secondary | ICD-10-CM | POA: Diagnosis not present

## 2022-12-22 DIAGNOSIS — H6991 Unspecified Eustachian tube disorder, right ear: Secondary | ICD-10-CM | POA: Diagnosis not present

## 2022-12-22 DIAGNOSIS — H6121 Impacted cerumen, right ear: Secondary | ICD-10-CM | POA: Diagnosis not present

## 2022-12-22 DIAGNOSIS — J209 Acute bronchitis, unspecified: Secondary | ICD-10-CM | POA: Diagnosis not present

## 2022-12-22 DIAGNOSIS — J069 Acute upper respiratory infection, unspecified: Secondary | ICD-10-CM | POA: Diagnosis not present

## 2022-12-22 LAB — COMPREHENSIVE METABOLIC PANEL
ALT: 27 [IU]/L (ref 0–32)
AST: 20 [IU]/L (ref 0–40)
Albumin: 4.7 g/dL (ref 3.8–4.8)
Alkaline Phosphatase: 82 [IU]/L (ref 44–121)
BUN/Creatinine Ratio: 11 — ABNORMAL LOW (ref 12–28)
BUN: 10 mg/dL (ref 8–27)
Bilirubin Total: 0.7 mg/dL (ref 0.0–1.2)
CO2: 19 mmol/L — ABNORMAL LOW (ref 20–29)
Calcium: 9.3 mg/dL (ref 8.7–10.3)
Chloride: 105 mmol/L (ref 96–106)
Creatinine, Ser: 0.92 mg/dL (ref 0.57–1.00)
Globulin, Total: 2.7 g/dL (ref 1.5–4.5)
Glucose: 118 mg/dL — ABNORMAL HIGH (ref 70–99)
Potassium: 4.4 mmol/L (ref 3.5–5.2)
Sodium: 142 mmol/L (ref 134–144)
Total Protein: 7.4 g/dL (ref 6.0–8.5)
eGFR: 66 mL/min/{1.73_m2} (ref >59)

## 2022-12-22 LAB — VITAMIN B12: Vitamin B-12: 308 pg/mL (ref 232–1245)

## 2022-12-22 LAB — VITAMIN D 25 HYDROXY (VIT D DEFICIENCY, FRACTURES): Vit D, 25-Hydroxy: 33.4 ng/mL (ref 30.0–100.0)

## 2022-12-22 LAB — INSULIN, RANDOM: INSULIN: 22.4 u[IU]/mL (ref 2.6–24.9)

## 2022-12-31 ENCOUNTER — Ambulatory Visit (HOSPITAL_COMMUNITY): Payer: Medicare Other | Attending: Internal Medicine

## 2022-12-31 DIAGNOSIS — R011 Cardiac murmur, unspecified: Secondary | ICD-10-CM | POA: Diagnosis not present

## 2022-12-31 LAB — ECHOCARDIOGRAM COMPLETE
Area-P 1/2: 2.55 cm2
Est EF: >75
S' Lateral: 2 cm

## 2023-01-12 ENCOUNTER — Encounter (INDEPENDENT_AMBULATORY_CARE_PROVIDER_SITE_OTHER): Payer: Self-pay | Admitting: Adult Health

## 2023-01-12 ENCOUNTER — Ambulatory Visit (INDEPENDENT_AMBULATORY_CARE_PROVIDER_SITE_OTHER): Payer: Medicare Other | Admitting: Adult Health

## 2023-01-12 VITALS — BP 111/71 | HR 71 | Temp 97.6°F | Ht 64.0 in | Wt 216.0 lb

## 2023-01-12 DIAGNOSIS — R7303 Prediabetes: Secondary | ICD-10-CM | POA: Diagnosis not present

## 2023-01-12 DIAGNOSIS — R944 Abnormal results of kidney function studies: Secondary | ICD-10-CM | POA: Diagnosis not present

## 2023-01-12 DIAGNOSIS — E669 Obesity, unspecified: Secondary | ICD-10-CM | POA: Diagnosis not present

## 2023-01-12 DIAGNOSIS — J302 Other seasonal allergic rhinitis: Secondary | ICD-10-CM

## 2023-01-12 DIAGNOSIS — E559 Vitamin D deficiency, unspecified: Secondary | ICD-10-CM

## 2023-01-12 DIAGNOSIS — Z6837 Body mass index (BMI) 37.0-37.9, adult: Secondary | ICD-10-CM | POA: Diagnosis not present

## 2023-01-12 MED ORDER — VITAMIN D (ERGOCALCIFEROL) 1.25 MG (50000 UNIT) PO CAPS
50000.0000 [IU] | ORAL_CAPSULE | ORAL | 0 refills | Status: DC
Start: 2023-01-12 — End: 2023-02-23

## 2023-01-12 NOTE — Progress Notes (Signed)
WEIGHT SUMMARY AND BIOMETRICS  Vitals Temp: 97.6 F (36.4 C) BP: 111/71 Pulse Rate: 71 SpO2: 97 %   Anthropometric Measurements Height: 5\' 4"  (1.626 m) Weight: 216 lb (98 kg) BMI (Calculated): 37.06 Weight at Last Visit: 223lb Weight Lost Since Last Visit: 7lb Weight Gained Since Last Visit: 0 Starting Weight: 223lb Total Weight Loss (lbs): 7 lb (3.175 kg)   Body Composition  Body Fat %: 45.5 % Fat Mass (lbs): 98.2 lbs Muscle Mass (lbs): 111.8 lbs Total Body Water (lbs): 80.6 lbs Visceral Fat Rating : 15   Other Clinical Data Fasting: no Labs: no Today's Visit #: 2 Starting Date: 12/21/22    Chief Complaint:   OBESITY Katelyn Walsh is here to discuss her progress with her obesity treatment plan. She is on the the Category 2 Plan and states she is following her eating plan approximately 50-60 % of the time.  She states she is exercising daily walking.   Interim History:  Last virtual OV with HWW on 01/07/2022 Last in office appt with HWW on 01/26/2022  Reconnected with HWW on 12/21/2022 Ozempic increased from 0.5mg  to 1mg  once weekly injection Denies mass in neck, dysphagia, dyspepsia, persistent hoarseness, abdominal pain, or N/V/C   Reviewed Bioimpedance Results with pt: Muscle Mass: +1.8 lbs Adipose Mass: -9.8 lbs  Of note- She lost her husband "Katelyn Walsh" age 76 in 02/10/2010 He passed away from MI/uncontrolled T2D  Subjective:   1. Seasonal allergies Upon return home from a 3 day trip to [facility]- she experienced nasal congestion, fatigue, cough. 12/22/22-PCP treated her with - Refills  Refill Albuterol Sulfate HFA Aerosol Solution, 108 (90 Base) MCG/ACT, Inhalation, 1, 2 puffs as needed for shortness of breath, wheezing, or cough urgency, every 4 hrs, 30 days, Refills=2 Refill predniSONE Tablet, 20 MG, Orally, 3 Tablet, 1 tablet, Once a day, 3 days, Refills=0 Refill Benzonatate Capsule, 200 MG, Orally, 30 Capsule, 1 capsule as needed, Three times a day,  10 days, Refills=0   2. Pre-diabetes Discussed Labs Lab Results  Component Value Date   HGBA1C 5.7 11/10/2021   HGBA1C 5.6 11/27/2020   HGBA1C 6.2 (H) 01/30/2020    12/21/2022 Ozempic increased from 0.5mg  to 1mg  once weekly injection Denies mass in neck, dysphagia, dyspepsia, persistent hoarseness, abdominal pain, or N/V/C She endorses an appreciable decrease in cravings with recent increase of GLP-1 therapy  3. Decreased GFR Discussed Labs  Latest Reference Range & Units 12/21/22 11:15  eGFR >59 mL/min/1.73 66    Latest Reference Range & Units 04/27/19 03:18 05/11/19 11:54 10/02/19 12:58 01/30/20 14:00  GFR, Est African American >59 mL/min/1.73 60 (L) 69 78 65  (L): Data is abnormally low  GFR improved She is on Losartan 100mg  daily  4. Vit D Def Discussed Labs  Latest Reference Range & Units 12/21/22 11:15  Vitamin D, 25-Hydroxy 30.0 - 100.0 ng/mL 33.4   Level subtherapeutic She is on OTC Vit D 3 5,000 international units   Assessment/Plan:   1. Seasonal allergies Continue to increase fluids and rest  2. Pre-diabetes Continue weekly Ozempic 1mg - does not require refill today  3. Decreased GFR Continue to avoid Nephrotoxic substances Keep BP well controlled Monitor labs  4. Vit D Def Stop OTC Vit D Supplementation Start  Vitamin D, Ergocalciferol, (DRISDOL) 1.25 MG (50000 UNIT) CAPS capsule Take 1 capsule (50,000 Units total) by mouth every 7 (seven) days. Dispense: 4 capsule, Refills: 0 ordered   5. Obesity with current BMI of 37.06  Vitina is currently  in the action stage of change. As such, her goal is to continue with weight loss efforts. She has agreed to the Category 2 Plan.   Exercise goals: Older adults should follow the adult guidelines. When older adults cannot meet the adult guidelines, they should be as physically active as their abilities and conditions will allow.  Older adults should do exercises that maintain or improve balance if they are at  risk of falling.  Older adults should determine their level of effort for physical activity relative to their level of fitness.  Older adults with chronic conditions should understand whether and how their conditions affect their ability to do regular physical activity safely.  Behavioral modification strategies: increasing lean protein intake, decreasing simple carbohydrates, increasing vegetables, increasing water intake, meal planning and cooking strategies, keeping healthy foods in the home, ways to avoid boredom eating, and planning for success.  Helen has agreed to follow-up with our clinic in 4 weeks. She was informed of the importance of frequent follow-up visits to maximize her success with intensive lifestyle modifications for her multiple health conditions.   Objective:   Blood pressure 111/71, pulse 71, temperature 97.6 F (36.4 C), height 5\' 4"  (1.626 m), weight 216 lb (98 kg), SpO2 97%. Body mass index is 37.08 kg/m.  General: Cooperative, alert, well developed, in no acute distress. HEENT: Conjunctivae and lids unremarkable. Cardiovascular: Regular rhythm.  Lungs: Normal work of breathing. Neurologic: No focal deficits.   Lab Results  Component Value Date   CREATININE 0.92 12/21/2022   BUN 10 12/21/2022   NA 142 12/21/2022   K 4.4 12/21/2022   CL 105 12/21/2022   CO2 19 (L) 12/21/2022   Lab Results  Component Value Date   ALT 27 12/21/2022   AST 20 12/21/2022   ALKPHOS 82 12/21/2022   BILITOT 0.7 12/21/2022   Lab Results  Component Value Date   HGBA1C 5.7 11/10/2021   HGBA1C 5.6 11/27/2020   HGBA1C 6.2 (H) 01/30/2020   HGBA1C 6.0 (H) 10/02/2019   Lab Results  Component Value Date   INSULIN 22.4 12/21/2022   INSULIN 9.7 01/30/2020   INSULIN 10.6 10/02/2019   INSULIN 20.3 05/11/2019   Lab Results  Component Value Date   TSH 0.997 10/02/2019   Lab Results  Component Value Date   CHOL 101 03/05/2020   HDL 44 03/05/2020   LDLCALC 36 03/05/2020    TRIG 105 03/05/2020   Lab Results  Component Value Date   VD25OH 33.4 12/21/2022   VD25OH 51.3 10/02/2019   VD25OH 27 (L) 01/09/2014   Lab Results  Component Value Date   WBC 6.0 04/26/2019   HGB 12.6 04/26/2019   HCT 40.5 04/26/2019   MCV 96.2 04/26/2019   PLT 171 04/26/2019   No results found for: "IRON", "TIBC", "FERRITIN"  Attestation Statements:   Reviewed by clinician on day of visit: allergies, medications, problem list, medical history, surgical history, family history, social history, and previous encounter notes.  I have reviewed the above documentation for accuracy and completeness, and I agree with the above. -  Kent Braunschweig d. Kristoffer Bala, NP-C

## 2023-01-28 DIAGNOSIS — N3946 Mixed incontinence: Secondary | ICD-10-CM | POA: Diagnosis not present

## 2023-01-28 DIAGNOSIS — R350 Frequency of micturition: Secondary | ICD-10-CM | POA: Diagnosis not present

## 2023-02-10 ENCOUNTER — Ambulatory Visit (INDEPENDENT_AMBULATORY_CARE_PROVIDER_SITE_OTHER): Payer: Medicare Other | Admitting: Adult Health

## 2023-02-12 DIAGNOSIS — D2222 Melanocytic nevi of left ear and external auricular canal: Secondary | ICD-10-CM | POA: Diagnosis not present

## 2023-02-12 DIAGNOSIS — D1801 Hemangioma of skin and subcutaneous tissue: Secondary | ICD-10-CM | POA: Diagnosis not present

## 2023-02-12 DIAGNOSIS — L720 Epidermal cyst: Secondary | ICD-10-CM | POA: Diagnosis not present

## 2023-02-12 DIAGNOSIS — L814 Other melanin hyperpigmentation: Secondary | ICD-10-CM | POA: Diagnosis not present

## 2023-02-12 DIAGNOSIS — L738 Other specified follicular disorders: Secondary | ICD-10-CM | POA: Diagnosis not present

## 2023-02-12 DIAGNOSIS — D225 Melanocytic nevi of trunk: Secondary | ICD-10-CM | POA: Diagnosis not present

## 2023-02-12 DIAGNOSIS — L821 Other seborrheic keratosis: Secondary | ICD-10-CM | POA: Diagnosis not present

## 2023-02-16 ENCOUNTER — Ambulatory Visit (INDEPENDENT_AMBULATORY_CARE_PROVIDER_SITE_OTHER): Payer: Medicare Other | Admitting: Family Medicine

## 2023-02-23 ENCOUNTER — Ambulatory Visit (INDEPENDENT_AMBULATORY_CARE_PROVIDER_SITE_OTHER): Payer: Medicare Other | Admitting: Family Medicine

## 2023-02-23 ENCOUNTER — Encounter (INDEPENDENT_AMBULATORY_CARE_PROVIDER_SITE_OTHER): Payer: Self-pay | Admitting: Family Medicine

## 2023-02-23 VITALS — BP 113/70 | HR 66 | Temp 98.0°F | Ht 64.0 in | Wt 213.0 lb

## 2023-02-23 DIAGNOSIS — E559 Vitamin D deficiency, unspecified: Secondary | ICD-10-CM

## 2023-02-23 DIAGNOSIS — Z6836 Body mass index (BMI) 36.0-36.9, adult: Secondary | ICD-10-CM | POA: Insufficient documentation

## 2023-02-23 DIAGNOSIS — R7303 Prediabetes: Secondary | ICD-10-CM

## 2023-02-23 DIAGNOSIS — E669 Obesity, unspecified: Secondary | ICD-10-CM

## 2023-02-23 MED ORDER — SEMAGLUTIDE (1 MG/DOSE) 4 MG/3ML ~~LOC~~ SOPN: 1.0000 mg | PEN_INJECTOR | SUBCUTANEOUS | 0 refills | Status: DC

## 2023-02-23 MED ORDER — VITAMIN D (ERGOCALCIFEROL) 1.25 MG (50000 UNIT) PO CAPS: 50000.0000 [IU] | ORAL_CAPSULE | ORAL | 0 refills | Status: DC

## 2023-02-23 NOTE — Progress Notes (Signed)
 .smr  Office: [phone]  /  Fax: [phone]  WEIGHT SUMMARY AND BIOMETRICS  Anthropometric Measurements Height: 5' 4 (1.626 m) Weight: 213 lb (96.6 kg) BMI (Calculated): 36.54 Weight at Last Visit: 216 lb Weight Lost Since Last Visit: 3 lb Weight Gained Since Last Visit: 0 Starting Weight: 223 lb Total Weight Loss (lbs): 10 lb (4.536 kg)   Body Composition  Body Fat %: 45.7 % Fat Mass (lbs): 97.4 lbs Muscle Mass (lbs): 109.8 lbs Total Body Water (lbs): 79.8 lbs Visceral Fat Rating : 15   Other Clinical Data Fasting: No Labs: No Today's Visit #: 3 Starting Date: 12/21/22    Chief Complaint: OBESITY   History of Present Illness   The patient, with a history of prediabetes and vitamin D  deficiency, presents for a follow-up visit. She has been on Ozempic  for prediabetes and prescription vitamin D  for her deficiency. Over the past five weeks, she has lost three pounds, even during the holiday season. However, she admits to only adhering to her category two eating plan about 10% of the time and is not currently exercising.  The patient describes a busy holiday season, with multiple family gatherings and outings, which disrupted her usual dietary routine. Despite these challenges, she managed to lose weight, which she attributes to portion control and making healthier choices when possible. She expresses concern about the impact of these holiday indulgences on her diet and is eager to return to her regular routine.  The patient also mentions having had two bronchial attacks in the past, but not recently. She has a history of frequent dining out with a family member, which she acknowledges can be challenging for her diet. She expresses a preference for spicy foods and is open to trying new recipes to add variety to her diet. She is particularly interested in easy-to-prepare meals, as she does not enjoy cooking.          PHYSICAL EXAM:  Blood pressure 113/70, pulse 66,  temperature 98 F (36.7 C), height 5' 4 (1.626 m), weight 213 lb (96.6 kg), SpO2 98%. Body mass index is 36.56 kg/m.  DIAGNOSTIC DATA REVIEWED:  BMET    Component Value Date/Time   NA 142 12/21/2022 1115   NA 142 07/31/2014 1416   K 4.4 12/21/2022 1115   K 4.1 07/31/2014 1416   CL 105 12/21/2022 1115   CL 107 01/18/2012 1314   CO2 19 (L) 12/21/2022 1115   CO2 23 07/31/2014 1416   GLUCOSE 118 (H) 12/21/2022 1115   GLUCOSE 178 (H) 04/27/2019 0318   GLUCOSE 124 07/31/2014 1416   GLUCOSE 126 (H) 01/18/2012 1314   BUN 10 12/21/2022 1115   BUN 12.4 07/31/2014 1416   CREATININE 0.92 12/21/2022 1115   CREATININE 0.9 07/31/2014 1416   CALCIUM  9.3 12/21/2022 1115   CALCIUM  8.9 07/31/2014 1416   GFRNONAA 57 (L) 01/30/2020 1400   GFRAA 65 01/30/2020 1400   Lab Results  Component Value Date   HGBA1C 5.7 11/10/2021   HGBA1C 6.0 (H) 10/02/2019   Lab Results  Component Value Date   INSULIN  22.4 12/21/2022   INSULIN  20.3 05/11/2019   Lab Results  Component Value Date   TSH 0.997 10/02/2019   CBC    Component Value Date/Time   WBC 6.0 04/26/2019 0323   RBC 4.21 04/26/2019 0323   HGB 12.6 04/26/2019 0323   HGB 12.7 07/31/2014 1416   HCT 40.5 04/26/2019 0323   HCT 38.9 07/31/2014 1416   PLT 171 04/26/2019 0323  PLT 193 07/31/2014 1416   MCV 96.2 04/26/2019 0323   MCV 92.6 07/31/2014 1416   MCH 29.9 04/26/2019 0323   MCHC 31.1 04/26/2019 0323   RDW 13.1 04/26/2019 0323   RDW 12.8 07/31/2014 1416   Iron Studies No results found for: IRON, TIBC, FERRITIN, IRONPCTSAT Lipid Panel     Component Value Date/Time   CHOL 101 03/05/2020 0000   CHOL 114 10/02/2019 1258   TRIG 105 03/05/2020 0000   HDL 44 03/05/2020 0000   HDL 40 10/02/2019 1258   LDLCALC 36 03/05/2020 0000   LDLCALC 56 10/02/2019 1258   Hepatic Function Panel     Component Value Date/Time   PROT 7.4 12/21/2022 1115   PROT 6.8 07/31/2014 1416   ALBUMIN 4.7 12/21/2022 1115   ALBUMIN 3.8  07/31/2014 1416   AST 20 12/21/2022 1115   AST 22 07/31/2014 1416   ALT 27 12/21/2022 1115   ALT 23 07/31/2014 1416   ALKPHOS 82 12/21/2022 1115   ALKPHOS 74 07/31/2014 1416   BILITOT 0.7 12/21/2022 1115   BILITOT 0.66 07/31/2014 1416      Component Value Date/Time   TSH 0.997 10/02/2019 1258   Nutritional Lab Results  Component Value Date   VD25OH 33.4 12/21/2022   VD25OH 51.3 10/02/2019   VD25OH 27 (L) 01/09/2014     Assessment and Plan    Prediabetes On Ozempic  for prediabetes. Lost three pounds in the last five weeks despite holiday indulgences. Following category two eating plan about ten percent of the time. Not currently exercising. - Continue Ozempic  - Encourage adherence to category two eating plan - Recommend starting an exercise regimen  Obesity Lost three pounds in the last five weeks. Challenges with diet adherence during the holidays. Discussed the importance of protein and vegetables in the diet. Provided easy and varied recipe ideas to maintain interest in healthy eating. Recommended using a crock pot for easy meal preparation. - Encourage weight loss through diet and exercise - Provide easy and varied recipe ideas - Recommend using a crock pot for easy meal preparation  Vitamin D  Deficiency Currently on prescription vitamin D . - Continue prescription vitamin D   General Health Maintenance Discussed the importance of maintaining a healthy diet and exercise routine. Provided specific recipe ideas to help with dietary adherence. Advised on using Greek yogurt for creaminess in recipes. Suggested lean protein options when dining out, such as filet mignon or [place]  strip steak. Encouraged avoiding frequent dining out if possible. - Provide recipes for creamy white chicken chili and chicken enchiladas - Recommend using Greek yogurt for creaminess in recipes - Advise on using carb counter tortillas - Suggest lean protein options when dining out - Encourage  avoiding frequent dining out  Follow-up - Ensure next couple of appointments are scheduled at the front desk.        She was informed of the importance of frequent follow up visits to maximize her success with intensive lifestyle modifications for her multiple health conditions.    Louann Penton, MD

## 2023-03-17 ENCOUNTER — Ambulatory Visit (INDEPENDENT_AMBULATORY_CARE_PROVIDER_SITE_OTHER): Payer: Medicare Other | Admitting: Family Medicine

## 2023-03-24 ENCOUNTER — Ambulatory Visit (INDEPENDENT_AMBULATORY_CARE_PROVIDER_SITE_OTHER): Payer: Medicare Other | Admitting: Family Medicine

## 2023-03-24 ENCOUNTER — Encounter (INDEPENDENT_AMBULATORY_CARE_PROVIDER_SITE_OTHER): Payer: Self-pay | Admitting: Family Medicine

## 2023-03-24 VITALS — BP 109/70 | HR 80 | Temp 98.4°F | Ht 64.0 in | Wt 208.0 lb

## 2023-03-24 DIAGNOSIS — E559 Vitamin D deficiency, unspecified: Secondary | ICD-10-CM

## 2023-03-24 DIAGNOSIS — E66812 Obesity, class 2: Secondary | ICD-10-CM

## 2023-03-24 DIAGNOSIS — Z6835 Body mass index (BMI) 35.0-35.9, adult: Secondary | ICD-10-CM

## 2023-03-24 DIAGNOSIS — R7303 Prediabetes: Secondary | ICD-10-CM

## 2023-03-24 MED ORDER — SEMAGLUTIDE (1 MG/DOSE) 4 MG/3ML ~~LOC~~ SOPN: 1.0000 mg | PEN_INJECTOR | SUBCUTANEOUS | 0 refills | Status: AC

## 2023-03-24 MED ORDER — VITAMIN D (ERGOCALCIFEROL) 1.25 MG (50000 UNIT) PO CAPS: 50000.0000 [IU] | ORAL_CAPSULE | ORAL | 0 refills | Status: AC

## 2023-03-24 NOTE — Assessment & Plan Note (Addendum)
Starting weight: 224 on 05/11/19 (first time start) BMR: 1985 on 05/11/19; 1624 on 12/21/22 Body Fat %: 44.8% Starting Meal Plan: Cat 2 Meal Plan needs: flexibility for travel  We discussed travel eating and cooking strategies today.  As well as how to navigate a menu when eating out.  Patient to stay consistent on category 2 plan.

## 2023-03-24 NOTE — Progress Notes (Signed)
   SUBJECTIVE:  Chief Complaint: Obesity  Interim History: Patient last appointment a month ago.  Her last appointment with me was a year and a half ago.  She is planning to go to [facility] next month.  She restarted the program in October of 2024.  She doesn't like cooking.  She also finds sticking to the meal plan to be difficult when she is traveling.  She is trying to get back to drinking water.  Quinlyn is here to discuss her progress with her obesity treatment plan. She is on the Category 2 Plan and states she is following her eating plan. She states she is not exercising much.   OBJECTIVE: Visit Diagnoses: Problem List Items Addressed This Visit       Other   Prediabetes   Patient is currently on semaglutide to help with her prediabetes as well as her obesity.  She needs a refill of this medication today.  She is noticing changes in terms of her ability to take in quantities of certain foods.  It also enabling her to make more nutritious food choices due to side effects when patient is eating more indulgently.      Relevant Medications   Semaglutide, 1 MG/DOSE, 4 MG/3ML SOPN   Vitamin D deficiency   Discussed importance of vitamin d supplementation.  Vitamin d supplementation has been shown to decrease fatigue, decrease risk of progression to insulin resistance and then prediabetes, decreases risk of falling in older age and can even assist in decreasing depressive symptoms in PTSD.   Prescription for Vitamin D sent in.        Relevant Medications   Vitamin D, Ergocalciferol, (DRISDOL) 1.25 MG (50000 UNIT) CAPS capsule   Obesity, Class II, BMI 35-39.9 - Primary   Starting weight: 224 on 05/11/19 (first time start) BMR: 1985 on 05/11/19; 1624 on 12/21/22 Body Fat %: 44.8% Starting Meal Plan: Cat 2 Meal Plan needs: flexibility for travel  We discussed travel eating and cooking strategies today.  As well as how to navigate a menu when eating out.  Patient to stay consistent on  category 2 plan.       No data recorded  No data recorded  No data recorded  No data recorded    ASSESSMENT AND PLAN:  Diet: Landis is currently in the action stage of change. As such, her goal is to continue with weight loss efforts. She has agreed to Category 2 Plan.  Exercise: Kacey has been instructed that some exercise is better than none for weight loss and overall health benefits.   Behavior Modification:  We discussed the following Behavioral Modification Strategies today: increasing lean protein intake, increasing vegetables, meal planning and cooking strategies, and planning for success.   No follow-ups on file.[name] Kitchen She was informed of the importance of frequent follow up visits to maximize her success with intensive lifestyle modifications for her multiple health conditions.  Attestation Statements:   Reviewed by clinician on day of visit: allergies, medications, problem list, medical history, surgical history, family history, social history, and previous encounter notes.      Reuben Likes, MD

## 2023-03-25 DIAGNOSIS — R350 Frequency of micturition: Secondary | ICD-10-CM | POA: Diagnosis not present

## 2023-03-25 DIAGNOSIS — N3946 Mixed incontinence: Secondary | ICD-10-CM | POA: Diagnosis not present

## 2023-03-29 ENCOUNTER — Encounter (INDEPENDENT_AMBULATORY_CARE_PROVIDER_SITE_OTHER): Payer: Self-pay

## 2023-04-02 ENCOUNTER — Other Ambulatory Visit (INDEPENDENT_AMBULATORY_CARE_PROVIDER_SITE_OTHER): Payer: Self-pay | Admitting: Family Medicine

## 2023-04-02 DIAGNOSIS — E559 Vitamin D deficiency, unspecified: Secondary | ICD-10-CM

## 2023-04-06 NOTE — Assessment & Plan Note (Signed)
Discussed importance of vitamin d supplementation.  Vitamin d supplementation has been shown to decrease fatigue, decrease risk of progression to insulin resistance and then prediabetes, decreases risk of falling in older age and can even assist in decreasing depressive symptoms in PTSD.   Prescription for Vitamin D sent in.

## 2023-04-06 NOTE — Assessment & Plan Note (Signed)
Patient is currently on semaglutide to help with her prediabetes as well as her obesity.  She needs a refill of this medication today.  She is noticing changes in terms of her ability to take in quantities of certain foods.  It also enabling her to make more nutritious food choices due to side effects when patient is eating more indulgently.

## 2023-04-09 ENCOUNTER — Other Ambulatory Visit: Payer: Self-pay | Admitting: Internal Medicine

## 2023-04-09 DIAGNOSIS — Z1231 Encounter for screening mammogram for malignant neoplasm of breast: Secondary | ICD-10-CM

## 2023-04-13 ENCOUNTER — Ambulatory Visit (INDEPENDENT_AMBULATORY_CARE_PROVIDER_SITE_OTHER): Payer: Medicare Other | Admitting: Family Medicine

## 2023-04-19 ENCOUNTER — Ambulatory Visit (INDEPENDENT_AMBULATORY_CARE_PROVIDER_SITE_OTHER): Payer: Medicare Other | Admitting: Family Medicine

## 2023-04-20 ENCOUNTER — Encounter (INDEPENDENT_AMBULATORY_CARE_PROVIDER_SITE_OTHER): Payer: Self-pay

## 2023-04-20 ENCOUNTER — Ambulatory Visit
Admission: RE | Admit: 2023-04-20 | Discharge: 2023-04-20 | Disposition: A | Discharge status: Home / Self Care | Payer: Medicare Other | Source: Ambulatory Visit | Attending: Internal Medicine | Admitting: Internal Medicine

## 2023-04-20 ENCOUNTER — Telehealth (INDEPENDENT_AMBULATORY_CARE_PROVIDER_SITE_OTHER): Payer: Self-pay | Admitting: Family Medicine

## 2023-04-20 DIAGNOSIS — Z1231 Encounter for screening mammogram for malignant neoplasm of breast: Secondary | ICD-10-CM | POA: Diagnosis not present

## 2023-04-20 NOTE — Telephone Encounter (Signed)
 Message sent to pt-CS

## 2023-04-20 NOTE — Telephone Encounter (Signed)
 Good afternoon!   Patient needs a refill for their Vitamin D before their 03/12 appt. They are completely out and are requesting it be sent to their normal pharmacy.

## 2023-05-04 DIAGNOSIS — I129 Hypertensive chronic kidney disease with stage 1 through stage 4 chronic kidney disease, or unspecified chronic kidney disease: Secondary | ICD-10-CM | POA: Diagnosis not present

## 2023-05-04 DIAGNOSIS — E559 Vitamin D deficiency, unspecified: Secondary | ICD-10-CM | POA: Diagnosis not present

## 2023-05-04 DIAGNOSIS — E785 Hyperlipidemia, unspecified: Secondary | ICD-10-CM | POA: Diagnosis not present

## 2023-05-04 DIAGNOSIS — N1832 Chronic kidney disease, stage 3b: Secondary | ICD-10-CM | POA: Diagnosis not present

## 2023-05-04 DIAGNOSIS — E1169 Type 2 diabetes mellitus with other specified complication: Secondary | ICD-10-CM | POA: Diagnosis not present

## 2023-05-05 ENCOUNTER — Ambulatory Visit (INDEPENDENT_AMBULATORY_CARE_PROVIDER_SITE_OTHER): Payer: Medicare Other | Admitting: Physician Assistant

## 2023-05-06 DIAGNOSIS — R350 Frequency of micturition: Secondary | ICD-10-CM | POA: Diagnosis not present

## 2023-05-06 DIAGNOSIS — N3946 Mixed incontinence: Secondary | ICD-10-CM | POA: Diagnosis not present

## 2023-05-07 DIAGNOSIS — J301 Allergic rhinitis due to pollen: Secondary | ICD-10-CM | POA: Diagnosis not present

## 2023-05-07 DIAGNOSIS — J4530 Mild persistent asthma, uncomplicated: Secondary | ICD-10-CM | POA: Diagnosis not present

## 2023-05-07 DIAGNOSIS — J3081 Allergic rhinitis due to animal (cat) (dog) hair and dander: Secondary | ICD-10-CM | POA: Diagnosis not present

## 2023-05-07 DIAGNOSIS — J019 Acute sinusitis, unspecified: Secondary | ICD-10-CM | POA: Diagnosis not present

## 2023-05-10 ENCOUNTER — Encounter (INDEPENDENT_AMBULATORY_CARE_PROVIDER_SITE_OTHER): Payer: Self-pay

## 2023-05-11 DIAGNOSIS — Z1331 Encounter for screening for depression: Secondary | ICD-10-CM | POA: Diagnosis not present

## 2023-05-11 DIAGNOSIS — F331 Major depressive disorder, recurrent, moderate: Secondary | ICD-10-CM | POA: Diagnosis not present

## 2023-05-11 DIAGNOSIS — R82998 Other abnormal findings in urine: Secondary | ICD-10-CM | POA: Diagnosis not present

## 2023-05-11 DIAGNOSIS — Z0000 Encounter for general adult medical examination without abnormal findings: Secondary | ICD-10-CM | POA: Diagnosis not present

## 2023-05-11 DIAGNOSIS — I872 Venous insufficiency (chronic) (peripheral): Secondary | ICD-10-CM | POA: Diagnosis not present

## 2023-05-11 DIAGNOSIS — F419 Anxiety disorder, unspecified: Secondary | ICD-10-CM | POA: Diagnosis not present

## 2023-05-11 DIAGNOSIS — I10 Essential (primary) hypertension: Secondary | ICD-10-CM | POA: Diagnosis not present

## 2023-05-11 DIAGNOSIS — Z853 Personal history of malignant neoplasm of breast: Secondary | ICD-10-CM | POA: Diagnosis not present

## 2023-05-11 DIAGNOSIS — R110 Nausea: Secondary | ICD-10-CM | POA: Diagnosis not present

## 2023-05-11 DIAGNOSIS — E785 Hyperlipidemia, unspecified: Secondary | ICD-10-CM | POA: Diagnosis not present

## 2023-05-11 DIAGNOSIS — Z1339 Encounter for screening examination for other mental health and behavioral disorders: Secondary | ICD-10-CM | POA: Diagnosis not present

## 2023-05-11 DIAGNOSIS — E1169 Type 2 diabetes mellitus with other specified complication: Secondary | ICD-10-CM | POA: Diagnosis not present

## 2023-07-27 DIAGNOSIS — H10413 Chronic giant papillary conjunctivitis, bilateral: Secondary | ICD-10-CM | POA: Diagnosis not present

## 2023-07-27 DIAGNOSIS — H2513 Age-related nuclear cataract, bilateral: Secondary | ICD-10-CM | POA: Diagnosis not present

## 2023-08-02 DIAGNOSIS — Z780 Asymptomatic menopausal state: Secondary | ICD-10-CM | POA: Diagnosis not present

## 2023-08-12 DIAGNOSIS — I10 Essential (primary) hypertension: Secondary | ICD-10-CM | POA: Diagnosis not present

## 2023-08-12 DIAGNOSIS — R110 Nausea: Secondary | ICD-10-CM | POA: Diagnosis not present

## 2023-08-12 DIAGNOSIS — Z1152 Encounter for screening for COVID-19: Secondary | ICD-10-CM | POA: Diagnosis not present

## 2023-08-12 DIAGNOSIS — R0981 Nasal congestion: Secondary | ICD-10-CM | POA: Diagnosis not present

## 2023-08-12 DIAGNOSIS — R051 Acute cough: Secondary | ICD-10-CM | POA: Diagnosis not present

## 2023-08-12 DIAGNOSIS — G43909 Migraine, unspecified, not intractable, without status migrainosus: Secondary | ICD-10-CM | POA: Diagnosis not present

## 2023-08-12 DIAGNOSIS — J069 Acute upper respiratory infection, unspecified: Secondary | ICD-10-CM | POA: Diagnosis not present

## 2023-09-15 DIAGNOSIS — E119 Type 2 diabetes mellitus without complications: Secondary | ICD-10-CM | POA: Diagnosis not present

## 2023-09-15 DIAGNOSIS — H353131 Nonexudative age-related macular degeneration, bilateral, early dry stage: Secondary | ICD-10-CM | POA: Diagnosis not present

## 2023-09-15 DIAGNOSIS — H25813 Combined forms of age-related cataract, bilateral: Secondary | ICD-10-CM | POA: Diagnosis not present

## 2023-09-15 DIAGNOSIS — H52203 Unspecified astigmatism, bilateral: Secondary | ICD-10-CM | POA: Diagnosis not present

## 2023-11-08 DIAGNOSIS — M25511 Pain in right shoulder: Secondary | ICD-10-CM | POA: Diagnosis not present

## 2023-11-27 DIAGNOSIS — M25511 Pain in right shoulder: Secondary | ICD-10-CM | POA: Diagnosis not present

## 2023-11-29 ENCOUNTER — Inpatient Hospital Stay
Payer: Federal, State, Local not specified - PPO | Attending: Hematology and Oncology | Admitting: Hematology and Oncology

## 2023-11-29 VITALS — BP 157/85 | HR 77 | Temp 98.4°F | Resp 18 | Wt 235.4 lb

## 2023-11-29 DIAGNOSIS — Z923 Personal history of irradiation: Secondary | ICD-10-CM | POA: Diagnosis not present

## 2023-11-29 DIAGNOSIS — M25519 Pain in unspecified shoulder: Secondary | ICD-10-CM | POA: Diagnosis not present

## 2023-11-29 DIAGNOSIS — L299 Pruritus, unspecified: Secondary | ICD-10-CM | POA: Insufficient documentation

## 2023-11-29 DIAGNOSIS — R635 Abnormal weight gain: Secondary | ICD-10-CM | POA: Insufficient documentation

## 2023-11-29 DIAGNOSIS — Z853 Personal history of malignant neoplasm of breast: Secondary | ICD-10-CM | POA: Insufficient documentation

## 2023-11-29 DIAGNOSIS — C50212 Malignant neoplasm of upper-inner quadrant of left female breast: Secondary | ICD-10-CM

## 2023-11-29 DIAGNOSIS — Z79899 Other long term (current) drug therapy: Secondary | ICD-10-CM | POA: Insufficient documentation

## 2023-11-29 DIAGNOSIS — Z08 Encounter for follow-up examination after completed treatment for malignant neoplasm: Secondary | ICD-10-CM | POA: Insufficient documentation

## 2023-11-29 MED ORDER — MELOXICAM 15 MG PO TBDP: 1.0000 | ORAL_TABLET | Freq: Two times a day (BID) | ORAL | Status: DC | PRN

## 2023-11-29 MED ORDER — MELOXICAM 15 MG PO TBDP: 1.0000 | ORAL_TABLET | Freq: Two times a day (BID) | ORAL | 1 refills | Status: AC | PRN

## 2023-11-29 NOTE — Assessment & Plan Note (Signed)
 Left breast invasive ductal carcinoma, 1.8 cm, T1 C. N0 M0 stage IA ER percent, PR negative, HER-2 negative, grade 2, Ki-67 90%, 3 SLN negative, Oncotype DX recurrence score 18, percent risk of recurrence, status post radiation therapy, Arimidex  1 mg daily was started August 2013 later switched to exemestane  then switched to Femara  and then switch to tamoxifen  and again switched back to Anastrozole  Nov 2015.  Completed August 2020     1. Itching of the skin: Improved.  3. Weight gain issues:  She is working with healthy weight loss clinic and has lost 30 pounds.  Because of her multiple travels to [facility] and [facility], she has gained some weight.   Patient loves going to [facility].    Breast Cancer Surveillance: Mammogram 04/20/2023: Benign breast density category B no evidence of malignancy breast density category B 12/15/2016: MRI pelvis: Perianal fistulous  Lower extremity edema due to diastolic dysfunction on diuretics.  Bone density December 2015 revealed a T score of -0.9, normal bone density   Return to clinic in 1 year for follow-up for surveillance.

## 2023-11-29 NOTE — Progress Notes (Signed)
 Patient Care Team: Larnell Hamilton, MD as PCP - General (Internal Medicine) Jeffrie Oneil BROCKS, MD as PCP - Cardiology (Cardiology) Signa Rush, MD (Inactive) as Attending Physician (Internal Medicine) Maryjane Fine, MD as Referring Physician (Family Medicine)  DIAGNOSIS:  Encounter Diagnosis  Name Primary?   Primary cancer of upper inner quadrant of left female breast (HCC) Yes    SUMMARY OF ONCOLOGIC HISTORY: Oncology History  Primary cancer of upper inner quadrant of left female breast (HCC)  07/31/2011 Surgery   left breast lumpectomy: IDC grade to 1.8 cm, no LV are, with DCIS, 3 sentinel nodes negative, grade 2, ER 100%, PR 0%, HER-2 negative, Ki-67 9%, T1 cN0 M0 stage IA, Oncotype score 18, 11% ROR low risk   10/14/2011 - 11/17/2011 Radiation Therapy   Adjuvant Radiation therapy   10/14/2011 - 10/03/2018 Anti-estrogen oral therapy   Arimidex  1 mg daily developed a change switched to Femara  and exemestane  and then took tamoxifen  and now back to Arimidex  from 01/09/2014       CHIEF COMPLIANT: Surveillance of breast cancer, rotator cuff pain  HISTORY OF PRESENT ILLNESS: History of Present Illness Katelyn Walsh is a 73 year old female who presents with shoulder pain and limited mobility following a possible rotator cuff injury. She was referred by a PA at [facility] for further evaluation after an MRI.  She experiences significant shoulder pain and limited mobility in her dominant arm, with an inability to lift her arm high. The pain began after pulling two heavy suitcases during a trip to [place]. The pain has slightly improved but still affects daily activities, including dressing and grooming.  An MRI was performed on Saturday, but results are pending. She is taking meloxicam 15 mg daily for pain management and has about nine pills remaining, expressing concern about running out of medication.     ALLERGIES:  is allergic to cat dander, dust mite extract, and pollen  extract.  MEDICATIONS:  Current Outpatient Medications  Medication Sig Dispense Refill   ALPRAZolam  (XANAX ) 0.5 MG tablet Take 1 tablet (0.5 mg total) by mouth at bedtime as needed for anxiety. 30 tablet 0   aspirin  EC 81 MG tablet Take 81 mg by mouth daily.     citalopram  (CELEXA ) 20 MG tablet Take 20 mg by mouth daily.     furosemide  (LASIX ) 40 MG tablet Take 40 mg by mouth. PRN     hydrOXYzine  (ATARAX /VISTARIL ) 25 MG tablet Take 1 tablet (25 mg total) by mouth 3 (three) times daily as needed. 30 tablet 6   levocetirizine (XYZAL ) 5 MG tablet Take 1 tablet (5 mg total) by mouth every evening.     losartan  (COZAAR ) 100 MG tablet Take 100 mg by mouth every morning.      montelukast  (SINGULAIR ) 10 MG tablet Take 10 mg by mouth at bedtime.     Probiotic Product (ALIGN) 4 MG CAPS Take 1 capsule by mouth daily with breakfast.      rOPINIRole  (REQUIP ) 1 MG tablet Take 1-2 mg by mouth at bedtime.      rosuvastatin  (CRESTOR ) 20 MG tablet Take 20 mg by mouth at bedtime.     Semaglutide , 1 MG/DOSE, 4 MG/3ML SOPN Inject 1 mg as directed once a week. 9 mL 0   topiramate  (TOPAMAX ) 100 MG tablet Take 100 mg by mouth 2 (two) times daily.     Vitamin D , Ergocalciferol , (DRISDOL ) 1.25 MG (50000 UNIT) CAPS capsule Take 1 capsule (50,000 Units total) by mouth every 7 (  seven) days. 4 capsule 0   No current facility-administered medications for this visit.    PHYSICAL EXAMINATION: ECOG PERFORMANCE STATUS: 1 - Symptomatic but completely ambulatory  Vitals:   11/29/23 1217  BP: (!) 157/85  Pulse: 77  Resp: 18  Temp: 98.4 F (36.9 C)  SpO2: 97%   Filed Weights   11/29/23 1217  Weight: 235 lb 6.4 oz (106.8 kg)      LABORATORY DATA:  I have reviewed the data as listed    Latest Ref Rng & Units 12/21/2022   11:15 AM 11/10/2021   12:00 AM 01/30/2020    2:00 PM  CMP  Glucose 70 - 99 mg/dL 881   881   BUN 8 - 27 mg/dL 10  21     17    Creatinine 0.57 - 1.00 mg/dL 9.07  1.0     8.98   Sodium 134 -  144 mmol/L 142  143     143   Potassium 3.5 - 5.2 mmol/L 4.4  4.2     4.3   Chloride 96 - 106 mmol/L 105  113     104   CO2 20 - 29 mmol/L 19  20     23    Calcium  8.7 - 10.3 mg/dL 9.3  8.5     9.7   Total Protein 6.0 - 8.5 g/dL 7.4   7.6   Total Bilirubin 0.0 - 1.2 mg/dL 0.7   0.7   Alkaline Phos 44 - 121 IU/L 82  89     91   AST 0 - 40 IU/L 20  14     18    ALT 0 - 32 IU/L 27  13     18       This result is from an external source.    Lab Results  Component Value Date   WBC 6.0 04/26/2019   HGB 12.6 04/26/2019   HCT 40.5 04/26/2019   MCV 96.2 04/26/2019   PLT 171 04/26/2019   NEUTROABS 3.1 07/31/2014    ASSESSMENT & PLAN:  Primary cancer of upper inner quadrant of left female breast (HCC) Left breast invasive ductal carcinoma, 1.8 cm, T1 C. N0 M0 stage IA ER percent, PR negative, HER-2 negative, grade 2, Ki-67 90%, 3 SLN negative, Oncotype DX recurrence score 18, percent risk of recurrence, status post radiation therapy, Arimidex  1 mg daily was started August 2013 later switched to exemestane  then switched to Femara  and then switch to tamoxifen  and again switched back to Anastrozole  Nov 2015.  Completed August 2020     1. Itching of the skin: Improved.  3. Weight gain issues:  She is working with healthy weight loss clinic and has lost 30 pounds.  Because of her multiple travels to [facility] and [facility], she has gained some weight.   Patient loves going to [facility] and [place]    Breast Cancer Surveillance: Mammogram 04/20/2023: Benign breast density category B no evidence of malignancy breast density category B 12/15/2016: MRI pelvis: Perianal fistulous  Lower extremity edema due to diastolic dysfunction on diuretics.  Bone density December 2015 revealed a T score of -0.9, normal bone density   Return to clinic in 1 year for follow-up for surveillance. ------------------------------------- Assessment and Plan Assessment & Plan Left shoulder pain with suspected rotator cuff  tear Limited range of motion and inability to lift arm high. Suspected rotator cuff tear on MRI. Pain likely exacerbated by recent heavy lifting. - Await MRI results from [facility]. -  Prescribe meloxicam 15 mg daily for pain management. - Advise flu shot in non-affected arm.  Primary cancer of upper inner quadrant of left breast Breast cancer with recent mammogram showing no abnormalities. Regular follow-up and monitoring ongoing. - Continue regular follow-up appointments. - Ensure mammograms are up to date.      No orders of the defined types were placed in this encounter.  The patient has a good understanding of the overall plan. she agrees with it. she will call with any problems that may develop before the next visit here. Total time spent: 30 mins including face to face time and time spent for planning, charting and co-ordination of care   Naomi MARLA Chad, MD 11/29/23

## 2023-12-22 DIAGNOSIS — I872 Venous insufficiency (chronic) (peripheral): Secondary | ICD-10-CM | POA: Diagnosis not present

## 2023-12-22 DIAGNOSIS — C50212 Malignant neoplasm of upper-inner quadrant of left female breast: Secondary | ICD-10-CM | POA: Diagnosis not present

## 2023-12-22 DIAGNOSIS — Z6838 Body mass index (BMI) 38.0-38.9, adult: Secondary | ICD-10-CM | POA: Diagnosis not present

## 2023-12-22 DIAGNOSIS — G2581 Restless legs syndrome: Secondary | ICD-10-CM | POA: Diagnosis not present

## 2023-12-22 DIAGNOSIS — N393 Stress incontinence (female) (male): Secondary | ICD-10-CM | POA: Diagnosis not present

## 2023-12-22 DIAGNOSIS — R011 Cardiac murmur, unspecified: Secondary | ICD-10-CM | POA: Diagnosis not present

## 2023-12-22 DIAGNOSIS — I10 Essential (primary) hypertension: Secondary | ICD-10-CM | POA: Diagnosis not present

## 2023-12-22 DIAGNOSIS — F331 Major depressive disorder, recurrent, moderate: Secondary | ICD-10-CM | POA: Diagnosis not present

## 2023-12-22 DIAGNOSIS — E1169 Type 2 diabetes mellitus with other specified complication: Secondary | ICD-10-CM | POA: Diagnosis not present

## 2023-12-22 DIAGNOSIS — F419 Anxiety disorder, unspecified: Secondary | ICD-10-CM | POA: Diagnosis not present

## 2023-12-22 DIAGNOSIS — E785 Hyperlipidemia, unspecified: Secondary | ICD-10-CM | POA: Diagnosis not present

## 2023-12-23 DIAGNOSIS — H1045 Other chronic allergic conjunctivitis: Secondary | ICD-10-CM | POA: Diagnosis not present

## 2023-12-23 DIAGNOSIS — J4530 Mild persistent asthma, uncomplicated: Secondary | ICD-10-CM | POA: Diagnosis not present

## 2023-12-23 DIAGNOSIS — J301 Allergic rhinitis due to pollen: Secondary | ICD-10-CM | POA: Diagnosis not present

## 2023-12-23 DIAGNOSIS — L299 Pruritus, unspecified: Secondary | ICD-10-CM | POA: Diagnosis not present

## 2023-12-29 DIAGNOSIS — M25511 Pain in right shoulder: Secondary | ICD-10-CM | POA: Diagnosis not present

## 2024-01-06 DIAGNOSIS — Z23 Encounter for immunization: Secondary | ICD-10-CM | POA: Diagnosis not present

## 2024-03-09 ENCOUNTER — Other Ambulatory Visit: Payer: Self-pay | Admitting: Internal Medicine

## 2024-03-09 DIAGNOSIS — Z1231 Encounter for screening mammogram for malignant neoplasm of breast: Secondary | ICD-10-CM

## 2024-04-25 ENCOUNTER — Ambulatory Visit

## 2024-11-28 ENCOUNTER — Ambulatory Visit: Admitting: Hematology and Oncology
